# Patient Record
Sex: Female | Born: 1935 | Race: White | Hispanic: No | Marital: Married
Health system: Midwestern US, Community
[De-identification: ages and names within clinical notes are randomized; demographics above are authoritative.]

## PROBLEM LIST (undated history)

## (undated) DIAGNOSIS — I10 Essential (primary) hypertension: Secondary | ICD-10-CM

## (undated) DIAGNOSIS — H4020X0 Unspecified primary angle-closure glaucoma, stage unspecified: Secondary | ICD-10-CM

## (undated) DIAGNOSIS — R7989 Other specified abnormal findings of blood chemistry: Secondary | ICD-10-CM

## (undated) DIAGNOSIS — N390 Urinary tract infection, site not specified: Secondary | ICD-10-CM

## (undated) DIAGNOSIS — R319 Hematuria, unspecified: Secondary | ICD-10-CM

## (undated) DIAGNOSIS — M810 Age-related osteoporosis without current pathological fracture: Secondary | ICD-10-CM

## (undated) DIAGNOSIS — K635 Polyp of colon: Secondary | ICD-10-CM

## (undated) DIAGNOSIS — F0390 Unspecified dementia without behavioral disturbance: Secondary | ICD-10-CM

## (undated) DIAGNOSIS — E559 Vitamin D deficiency, unspecified: Secondary | ICD-10-CM

## (undated) DIAGNOSIS — D690 Allergic purpura: Secondary | ICD-10-CM

## (undated) DIAGNOSIS — H409 Unspecified glaucoma: Secondary | ICD-10-CM

## (undated) DIAGNOSIS — H348122 Central retinal vein occlusion, left eye, stable: Secondary | ICD-10-CM

## (undated) DIAGNOSIS — E079 Disorder of thyroid, unspecified: Secondary | ICD-10-CM

## (undated) DIAGNOSIS — L509 Urticaria, unspecified: Secondary | ICD-10-CM

## (undated) DIAGNOSIS — R413 Other amnesia: Secondary | ICD-10-CM

## (undated) DIAGNOSIS — F0280 Dementia in other diseases classified elsewhere without behavioral disturbance: Secondary | ICD-10-CM

## (undated) DIAGNOSIS — M1990 Unspecified osteoarthritis, unspecified site: Secondary | ICD-10-CM

## (undated) DIAGNOSIS — L309 Dermatitis, unspecified: Secondary | ICD-10-CM

## (undated) DIAGNOSIS — N189 Chronic kidney disease, unspecified: Secondary | ICD-10-CM

## (undated) DIAGNOSIS — C50919 Malignant neoplasm of unspecified site of unspecified female breast: Secondary | ICD-10-CM

## (undated) DIAGNOSIS — Z8709 Personal history of other diseases of the respiratory system: Secondary | ICD-10-CM

## (undated) DIAGNOSIS — F419 Anxiety disorder, unspecified: Secondary | ICD-10-CM

## (undated) DIAGNOSIS — R9389 Abnormal findings on diagnostic imaging of other specified body structures: Secondary | ICD-10-CM

## (undated) HISTORY — DX: Allergic purpura: D69.0

## (undated) HISTORY — DX: Essential (primary) hypertension: I10

## (undated) HISTORY — DX: Urinary tract infection, site not specified: N39.0

## (undated) HISTORY — DX: Unspecified glaucoma: H40.9

## (undated) HISTORY — DX: Age-related osteoporosis without current pathological fracture: M81.0

## (undated) HISTORY — DX: Other amnesia: R41.3

## (undated) HISTORY — DX: Malignant neoplasm of unspecified site of unspecified female breast: C50.919

## (undated) HISTORY — PX: ABDOMINAL HYSTERECTOMY: SHX81

## (undated) HISTORY — DX: Dementia in other diseases classified elsewhere, unspecified severity, without behavioral disturbance, psychotic disturbance, mood disturbance, and anxiety: F02.80

## (undated) HISTORY — DX: Unspecified primary angle-closure glaucoma, stage unspecified: H40.20X0

## (undated) HISTORY — DX: Vitamin D deficiency, unspecified: E55.9

## (undated) HISTORY — DX: Chronic kidney disease, unspecified: N18.9

## (undated) HISTORY — DX: Urticaria, unspecified: L50.9

## (undated) HISTORY — DX: Disorder of thyroid, unspecified: E07.9

## (undated) HISTORY — DX: Dermatitis, unspecified: L30.9

## (undated) HISTORY — PX: BIOPSY THYROID: PRO38

## (undated) HISTORY — DX: Other specified abnormal findings of blood chemistry: R79.89

## (undated) HISTORY — DX: Hematuria, unspecified: R31.9

## (undated) HISTORY — DX: Unspecified dementia, unspecified severity, without behavioral disturbance, psychotic disturbance, mood disturbance, and anxiety: F03.90

## (undated) HISTORY — DX: Central retinal vein occlusion, left eye, stable: H34.8122

## (undated) HISTORY — DX: Polyp of colon: K63.5

## (undated) HISTORY — DX: Unspecified osteoarthritis, unspecified site: M19.90

---
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
## 1987-02-04 HISTORY — PX: BREAST BIOPSY: SHX20

## 1987-03-06 HISTORY — PX: MASTECTOMY MODIFIED RADICAL: SUR848

## 2001-01-03 HISTORY — PX: BREAST BIOPSY: SHX20

## 2001-02-03 HISTORY — PX: MASTECTOMY: SHX3

## 2001-09-05 HISTORY — PX: RENAL BIOPSY: SHX156

## 2010-11-04 DIAGNOSIS — R7989 Other specified abnormal findings of blood chemistry: Secondary | ICD-10-CM

## 2010-11-04 HISTORY — DX: Other specified abnormal findings of blood chemistry: R79.89

## 2013-09-16 NOTE — Progress Notes (Addendum)
HISTORY OF PRESENT ILLNESS  Brett Canalesheresa L Bieda is a 80 y.o. female.  HPIshe is here for annual wellness exam  Chief Complaint   Patient presents with   ??? Annual Wellness Visit     Past Medical History   Diagnosis Date   ??? Thyroid disease      low TSH followed by Dr. Mickie Kayahmani   ??? Hypertension    ??? Cancer      Bilateral Breast Cancer   ??? Chronic kidney disease      followed by Dr. Dora Simssai   ??? Colon polyps    ??? Osteoporosis      followed by Dr. Odis Lusterlipsey   ??? Arthritis      osteo-arthritis   ??? Vitamin D deficiency        Review of Systems   Constitutional: Negative for fever, chills and malaise/fatigue.   HENT: Negative for congestion and hearing loss.    Eyes: Negative for blurred vision and pain.   Respiratory: Negative for cough, hemoptysis and shortness of breath.    Cardiovascular: Negative for chest pain, palpitations and leg swelling.   Gastrointestinal: Negative for heartburn, nausea, abdominal pain, diarrhea, constipation and blood in stool.   Genitourinary: Negative for dysuria, urgency, frequency and hematuria.   Musculoskeletal: Negative for myalgias and joint pain.   Neurological: Negative for dizziness, sensory change and headaches.   Endo/Heme/Allergies: Negative for polydipsia. Does not bruise/bleed easily.   Psychiatric/Behavioral: Negative for depression. The patient is not nervous/anxious and does not have insomnia.      Visit Vitals   Item Reading   ??? BP 116/78   ??? Pulse 60   ??? Ht 5' 3.5" (1.613 m)   ??? Wt 102 lb (46.267 kg)   ??? BMI 17.78 kg/m2       Physical Exam    ASSESSMENT and PLAN  sched bone density.  Immunizations up to date.  Will get labs today.  Medications refilled.

## 2013-09-23 LAB — CELIAC PANEL
DEAMIDATED GLIADIN ABS,IGA, 161651: 4 units (ref 0–19)
DEAMIDATED GLIADIN ABS,IGG, 161692: 2 units (ref 0–19)
Deamidated Gliadin Ab, IgA: 4 units (ref 0–19)
Deamidated Gliadin Ab, IgG: 2 units (ref 0–19)
Endomysial Ab, IgA: NEGATIVE
Endomysial Antibody, IgA: NEGATIVE
Immunoglobulin A, Qt.: 155 mg/dL (ref 91–414)
Immunoglobulin A, Qt.: 155 mg/dL (ref 91–414)
T-TRANSGLUTAMINASE (TTG) IGG,164989: <2 U/mL (ref 0–5)
T-TRANSGLUTAMINASE, IGA, 164643: <2 U/mL (ref 0–3)
t-Transglutaminase, IgA: <2 U/mL (ref 0–3)
t-Transglutaminase, IgG: <2 U/mL (ref 0–5)

## 2013-09-23 LAB — METABOLIC PANEL, COMPREHENSIVE
A-G Ratio: 2.2 (ref 1.1–2.5)
ALT (SGPT): 17 IU/L (ref 0–32)
AST (SGOT): 20 IU/L (ref 0–40)
Albumin: 4.3 g/dL (ref 3.5–4.8)
Alk. phosphatase: 61 IU/L (ref 39–117)
BUN/Creatinine ratio: 23 (ref 11–26)
BUN: 23 mg/dL (ref 8–27)
Bilirubin, total: 0.5 mg/dL (ref 0.0–1.2)
CO2: 25 mmol/L (ref 18–29)
Calcium: 9.5 mg/dL (ref 8.6–10.2)
Chloride: 100 mmol/L (ref 97–108)
Creatinine: 1 mg/dL (ref 0.57–1.00)
GFR est AA: 63 mL/min/{1.73_m2} (ref >59)
GFR est non-AA: 54 mL/min/{1.73_m2} — ABNORMAL LOW (ref >59)
GLOBULIN, TOTAL: 2 g/dL (ref 1.5–4.5)
Glucose: 83 mg/dL (ref 65–99)
Potassium: 4.4 mmol/L (ref 3.5–5.2)
Protein, total: 6.3 g/dL (ref 6.0–8.5)
Sodium: 140 mmol/L (ref 134–144)

## 2013-09-23 LAB — CBC WITH AUTOMATED DIFF
ABS. BASOPHILS: 0.1 10*3/uL (ref 0.0–0.2)
ABS. EOSINOPHILS: 0.2 10*3/uL (ref 0.0–0.4)
ABS. IMM. GRANS.: 0 10*3/uL (ref 0.0–0.1)
ABS. MONOCYTES: 0.8 10*3/uL (ref 0.1–0.9)
ABS. NEUTROPHILS: 2.5 10*3/uL (ref 1.4–7.0)
Abs Lymphocytes: 1.5 10*3/uL (ref 0.7–3.1)
BASOPHILS: 1 %
EOSINOPHILS: 3 %
HCT: 43.8 % (ref 34.0–46.6)
HGB: 14.5 g/dL (ref 11.1–15.9)
IMMATURE GRANULOCYTES: 0 %
Lymphocytes: 30 %
MCH: 32 pg (ref 26.6–33.0)
MCHC: 33.1 g/dL (ref 31.5–35.7)
MCV: 97 fL (ref 79–97)
MONOCYTES: 16 %
NEUTROPHILS: 50 %
PLATELET: 223 10*3/uL (ref 150–379)
RBC: 4.53 x10E6/uL (ref 3.77–5.28)
RDW: 13 % (ref 12.3–15.4)
WBC: 5 10*3/uL (ref 3.4–10.8)

## 2013-09-23 LAB — LIPID PANEL WITH LDL/HDL RATIO
Cholesterol, total: 207 mg/dL — ABNORMAL HIGH (ref 100–199)
HDL Cholesterol: 82 mg/dL (ref >39)
LDL, calculated: 102 mg/dL — ABNORMAL HIGH (ref 0–99)
LDL/HDL Ratio: 1.2 ratio units (ref 0.0–3.2)
Triglyceride: 114 mg/dL (ref 0–149)
VLDL, calculated: 23 mg/dL (ref 5–40)

## 2013-09-24 NOTE — Progress Notes (Signed)
Quick Note:    Celiac Disease panel is negative. Cholesterol with mild elevation; otherwise, labs are within normal. Mail to patient. Lb/gbt  ______

## 2014-03-12 DIAGNOSIS — Z9889 Other specified postprocedural states: Secondary | ICD-10-CM

## 2014-03-12 HISTORY — DX: Other specified postprocedural states: Z98.890

## 2014-09-22 ENCOUNTER — Encounter: Attending: Family Medicine | Primary: Family Medicine

## 2014-09-30 ENCOUNTER — Ambulatory Visit: Admit: 2014-09-30 | Discharge: 2014-09-30 | Payer: MEDICARE | Attending: Nurse Practitioner | Primary: Family Medicine

## 2014-09-30 DIAGNOSIS — Z0000 Encounter for general adult medical examination without abnormal findings: Secondary | ICD-10-CM

## 2014-09-30 LAB — AMB POC URINALYSIS DIP STICK AUTO W/O MICRO
Bilirubin (UA POC): NEGATIVE
Blood (UA POC): NEGATIVE
Glucose (UA POC): NEGATIVE
Ketones (UA POC): NEGATIVE
Nitrites (UA POC): NEGATIVE
Protein (UA POC): NEGATIVE mg/dL
Specific gravity (UA POC): 1.005 (ref 1.001–1.035)
Urobilinogen (UA POC): NORMAL (ref 0.2–1)
pH (UA POC): 5.5 (ref 4.6–8.0)

## 2014-09-30 NOTE — Progress Notes (Signed)
[facility], ESTABLISHED PATIENT, Annual Medicare Wellness    Melanie Houston  [id number]  [birth year]    Chief Complaint:   Chief Complaint   Patient presents with   ??? Annual Wellness Visit     History of Present Illness:  Melanie Houston is a 81 y.o. female who presents today for annual medicare wellness visit.  She is currently doing well, denies complaints or concerns.  Medicare wellness questionnaire completed today.  She is agreeable with routine labs today.  She is followed by nephrology and endocrinology--explained that these labs would be faxed to these specialist.    Review of Systems:  Review of Systems   Constitutional: Negative.    HENT: Negative.    Eyes: Negative.    Respiratory: Negative.    Cardiovascular: Negative.    Gastrointestinal: Negative.    Genitourinary: Negative.    Musculoskeletal: Negative.    Skin: Negative.    Neurological: Negative.    Endo/Heme/Allergies: Negative.    Psychiatric/Behavioral: Negative.        Medications:  Current Outpatient Prescriptions   Medication Sig   ??? ascorbic acid (VITAMIN C) 500 mg tablet Strength: ; Form: ; SIG: take 1 tablet orally Daily   ??? lactobacillus, B.ani-L.aci-L.sal-L.plan-L.Cas, 10 billion cell (2 billion ea) cap capsule Strength: ; Form: ; SIG: take as directed   ??? cranberry extract 450 mg tab Strength: ; Form: ; SIG: take 1 cap  daily   ??? doxycycline (VIBRAMYCIN) 100 mg capsule    ??? ergocalciferol (ERGOCALCIFEROL) 50,000 unit capsule Strength: 200 mg; Form: capsule; SIG: take 1 tab orally daily   ??? estradiol (ESTRACE) 0.01 % (0.1 mg/gram) vaginal cream    ??? ketoconazole (NIZORAL) 2 % shampoo    ??? nitrofurantoin, macrocrystal-monohydrate, (MACROBID) 100 mg capsule    ??? losartan (COZAAR) 100 mg tablet Strength: 100 mg; Form: tablet; SIG: take 1 tab(s) orally once a day   ??? metoprolol succinate (TOPROL-XL) 100 mg XL tablet Strength: 100 mg; Form: tablet, extended release; SIG: take 1 tab(s) orally once a day    ??? FLUZONE HIGH-DOSE 2015-16, PF, syrg injection      No current facility-administered medications for this visit.       Past Medical History:  Past Medical History   Diagnosis Date   ??? Thyroid disease      low TSH followed by Dr. Mickie Kay   ??? Hypertension    ??? Cancer (HCC)      Bilateral Breast Cancer   ??? Chronic kidney disease      followed by Dr. Dora Sims   ??? Colon polyps    ??? Osteoporosis      followed by Dr. Odis Luster   ??? Arthritis      osteo-arthritis   ??? Vitamin D deficiency        Surgical History:  Past Surgical History   Procedure Laterality Date   ??? Hx colonoscopy  2006   ??? Hx gyn       hysterectomy   ??? Hx heent       tonsillectomy   ??? Hx mastectomy Bilateral 1988 and 2002       Family History:  Family History   Problem Relation Age of Onset   ??? Cancer Mother    ??? Heart Disease Father        Social History:  History     Social History   ??? Marital Status: MARRIED     Spouse Name: N/A     Number of  Children: N/A   ??? Years of Education: N/A     Occupational History   ??? Not on file.     Social History Main Topics   ??? Smoking status: Never Smoker    ??? Smokeless tobacco: Not on file   ??? Alcohol Use: No   ??? Drug Use: Not on file   ??? Sexual Activity: Not on file     Other Topics Concern   ??? Not on file     Social History Narrative       History   Smoking status   ??? Never Smoker    Smokeless tobacco   ??? Not on file       Allergies:  Allergies   Allergen Reactions   ??? Azithromycin Unknown (comments)   ??? Celebrex [Celecoxib] Unknown (comments)   ??? Ibuprofen Unknown (comments)   ??? Penicillins Unknown (comments)   ??? Sulfa (Sulfonamide Antibiotics) Unknown (comments)   ??? Tetracyclines Unknown (comments)       Vital Signs:  BP 180/82 mmHg   Pulse 60   Ht 5\' 4"  (1.626 m)   Wt 105 lb (47.628 kg)   BMI 18.01 kg/m2  Body mass index is 18.01 kg/(m^2).    Lab/Imaging Results:  none    Physical Exam:  Physical Exam   Constitutional: She is oriented to person, place, and time and well-developed, well-nourished, and in no distress.    HENT:   Head: Normocephalic and atraumatic.   Eyes: EOM are normal.   Neck: Normal range of motion. Neck supple.   Pulmonary/Chest: Effort normal.   Musculoskeletal: Normal range of motion.   Neurological: She is alert and oriented to person, place, and time. Gait normal.   Skin: Skin is warm and dry.   Psychiatric: Mood, memory, affect and judgment normal.   Nursing note and vitals reviewed.        Assessment and Plan:  Patient Active Problem List   Diagnosis Code   ??? Thyroid disease E07.9   ??? Hypertension I10   ??? Chronic kidney disease N18.9   ??? Cancer (HCC) C80.1   ??? Colon polyps K63.5   ??? Osteoporosis M81.0   ??? Arthritis M19.90   ??? Vitamin D deficiency E55.9       ICD-10-CM ICD-9-CM    1. Medicare annual wellness visit, subsequent Z00.00 V70.0 AMB POC URINALYSIS DIP STICK AUTO W/O MICRO      METABOLIC PANEL, COMPREHENSIVE      LIPID PANEL      CBC WITH AUTOMATED DIFF   2. Cancer (HCC) C80.1 199.1 CBC WITH AUTOMATED DIFF   3. Thyroid disease E07.9 246.9 METABOLIC PANEL, COMPREHENSIVE      TSH, 3RD GENERATION   4. Chronic kidney disease, unspecified stage N18.9 585.9 METABOLIC PANEL, COMPREHENSIVE   5. Essential hypertension I10 401.9 METABOLIC PANEL, COMPREHENSIVE      LIPID PANEL     Follow-up Disposition:  Return in about 6 months (around 03/31/2015) for BP manangement.      Collier BullockJennifer D Ronisha Herringshaw, NP

## 2014-10-01 LAB — METABOLIC PANEL, COMPREHENSIVE
A-G Ratio: 1.9 (ref 1.1–2.5)
ALT (SGPT): 25 IU/L (ref 0–32)
AST (SGOT): 24 IU/L (ref 0–40)
Albumin: 4.4 g/dL (ref 3.5–4.8)
Alk. phosphatase: 76 IU/L (ref 39–117)
BUN/Creatinine ratio: 21 (ref 11–26)
BUN: 22 mg/dL (ref 8–27)
Bilirubin, total: 0.5 mg/dL (ref 0.0–1.2)
CO2: 25 mmol/L (ref 18–29)
Calcium: 10 mg/dL (ref 8.7–10.3)
Chloride: 101 mmol/L (ref 97–108)
Creatinine: 1.03 mg/dL — ABNORMAL HIGH (ref 0.57–1.00)
GFR est AA: 60 mL/min/{1.73_m2} (ref >59)
GFR est non-AA: 52 mL/min/{1.73_m2} — ABNORMAL LOW (ref >59)
GLOBULIN, TOTAL: 2.3 g/dL (ref 1.5–4.5)
Glucose: 92 mg/dL (ref 65–99)
Potassium: 5 mmol/L (ref 3.5–5.2)
Protein, total: 6.7 g/dL (ref 6.0–8.5)
Sodium: 141 mmol/L (ref 134–144)

## 2014-10-01 LAB — LIPID PANEL
Cholesterol, total: 235 mg/dL — ABNORMAL HIGH (ref 100–199)
HDL Cholesterol: 95 mg/dL (ref >39)
LDL, calculated: 121 mg/dL — ABNORMAL HIGH (ref 0–99)
Triglyceride: 94 mg/dL (ref 0–149)
VLDL, calculated: 19 mg/dL (ref 5–40)

## 2014-10-01 LAB — CBC WITH AUTOMATED DIFF
ABS. BASOPHILS: 0 10*3/uL (ref 0.0–0.2)
ABS. EOSINOPHILS: 0.1 10*3/uL (ref 0.0–0.4)
ABS. IMM. GRANS.: 0 10*3/uL (ref 0.0–0.1)
ABS. MONOCYTES: 0.8 10*3/uL (ref 0.1–0.9)
ABS. NEUTROPHILS: 5.9 10*3/uL (ref 1.4–7.0)
Abs Lymphocytes: 1.3 10*3/uL (ref 0.7–3.1)
BASOPHILS: 0 %
EOSINOPHILS: 1 %
HCT: 44.8 % (ref 34.0–46.6)
HGB: 15.2 g/dL (ref 11.1–15.9)
IMMATURE GRANULOCYTES: 0 %
Lymphocytes: 16 %
MCH: 32.6 pg (ref 26.6–33.0)
MCHC: 33.9 g/dL (ref 31.5–35.7)
MCV: 96 fL (ref 79–97)
MONOCYTES: 10 %
NEUTROPHILS: 73 %
PLATELET: 205 10*3/uL (ref 150–379)
RBC: 4.66 x10E6/uL (ref 3.77–5.28)
RDW: 13.1 % (ref 12.3–15.4)
WBC: 8.1 10*3/uL (ref 3.4–10.8)

## 2014-10-01 LAB — TSH 3RD GENERATION: TSH: 2.81 u[IU]/mL (ref 0.450–4.500)

## 2014-10-12 NOTE — Progress Notes (Signed)
Quick Note:        I have reviewed all the lab results. There are some abnormalities that are not critical to the patient's health, but I would like for her to repeat labs at next appt.    Healthy diet choices to avoid high fat, processed foods    ______

## 2014-10-13 NOTE — Progress Notes (Signed)
Quick Note:        Patient notified. She sees Dr. Dora Simssai for kidney disease.    ______

## 2015-03-31 ENCOUNTER — Encounter: Attending: Nurse Practitioner | Primary: Family Medicine

## 2015-04-06 ENCOUNTER — Ambulatory Visit: Admit: 2015-04-06 | Discharge: 2015-04-06 | Payer: MEDICARE | Attending: Family Medicine | Primary: Family Medicine

## 2015-04-06 DIAGNOSIS — I10 Essential (primary) hypertension: Secondary | ICD-10-CM

## 2015-04-06 MED ORDER — LOSARTAN 100 MG TAB
100 mg | ORAL_TABLET | Freq: Every day | ORAL | Status: DC
Start: 2015-04-06 — End: 2015-10-05

## 2015-04-06 MED ORDER — METOPROLOL SUCCINATE SR 100 MG 24 HR TAB
100 mg | ORAL_TABLET | Freq: Every day | ORAL | Status: DC
Start: 2015-04-06 — End: 2015-10-05

## 2015-04-06 NOTE — Progress Notes (Signed)
HISTORY OF PRESENT ILLNESS  Melanie Houston is a 81 y.o. female.  Hypertension   The history is provided by the patient. This is a chronic problem. The problem has not changed since onset.Pertinent negatives include no chest pain, no orthopnea, no palpitations, no PND, no anxiety, no confusion, no malaise/fatigue, no blurred vision, no headaches, no peripheral edema, no dizziness, no shortness of breath and no nausea.     Current Outpatient Prescriptions   Medication Sig   ??? cholecalciferol, vitamin D3, 2,000 unit tab Take  by mouth.   ??? metoprolol succinate (TOPROL-XL) 100 mg tablet Take 1 Tab by mouth daily.   ??? losartan (COZAAR) 100 mg tablet Take 1 Tab by mouth daily.   ??? ascorbic acid (VITAMIN C) 500 mg tablet Strength: ; Form: ; SIG: take 1 tablet orally Daily   ??? lactobacillus, B.ani-L.aci-L.sal-L.plan-L.Cas, 10 billion cell (2 billion ea) cap capsule Strength: ; Form: ; SIG: take as directed   ??? cranberry extract 450 mg tab Strength: ; Form: ; SIG: take 1 cap  daily   ??? estradiol (ESTRACE) 0.01 % (0.1 mg/gram) vaginal cream    ??? ketoconazole (NIZORAL) 2 % shampoo      No current facility-administered medications for this visit.     Past Medical History   Diagnosis Date   ??? Thyroid disease      low TSH followed by Dr. Mickie Kay   ??? Hypertension    ??? Cancer (HCC)      Bilateral Breast Cancer   ??? Chronic kidney disease      followed by Dr. Dora Sims   ??? Colon polyps    ??? Osteoporosis      followed by Dr. Odis Luster   ??? Arthritis      osteo-arthritis   ??? Vitamin D deficiency        Review of Systems   Constitutional: Negative for fever, chills and malaise/fatigue.   HENT: Negative for congestion and hearing loss.    Eyes: Negative for blurred vision, pain and discharge.   Respiratory: Negative for cough, hemoptysis, shortness of breath and wheezing.    Cardiovascular: Negative for chest pain, palpitations, orthopnea, leg swelling and PND.   Gastrointestinal: Negative for heartburn, nausea, abdominal pain,  diarrhea, constipation and blood in stool.   Genitourinary: Negative for dysuria, urgency, frequency and hematuria.   Musculoskeletal: Negative for myalgias and joint pain.   Skin: Negative for rash.   Neurological: Negative for dizziness, sensory change and headaches.   Endo/Heme/Allergies: Negative for polydipsia. Does not bruise/bleed easily.   Psychiatric/Behavioral: Negative for depression and confusion. The patient is not nervous/anxious and does not have insomnia.      Blood pressure 154/76, pulse 60, height 5\' 4"  (1.626 m), weight 105 lb (47.628 kg).    Physical Exam   Constitutional: She is oriented to person, place, and time. She appears well-developed and well-nourished.   Eyes: EOM are normal. Pupils are equal, round, and reactive to light.   Neck: No JVD present. No thyromegaly present.   Cardiovascular: Normal rate, regular rhythm, normal heart sounds and intact distal pulses.    No murmur heard.  Pulmonary/Chest: Effort normal and breath sounds normal. No respiratory distress.   Abdominal: Soft. Bowel sounds are normal. She exhibits no mass. There is no tenderness.   Musculoskeletal: She exhibits no edema.   Neurological: She is alert and oriented to person, place, and time.   Skin: Skin is warm and dry.   Psychiatric: She has a normal mood  and affect.   Nursing note and vitals reviewed.      ASSESSMENT and PLAN    ICD-10-CM ICD-9-CM    1. Essential hypertension, hypertension with unspecified goal I10 401.9      Suezette was seen today for follow-up.    Diagnoses and all orders for this visit:    Essential hypertension, hypertension with unspecified goal    Other orders  -     metoprolol succinate (TOPROL-XL) 100 mg tablet; Take 1 Tab by mouth daily.  -     losartan (COZAAR) 100 mg tablet; Take 1 Tab by mouth daily.      the following changes in treatment are made: check so night readings and call them in to make sure we are not over treating

## 2015-06-06 DIAGNOSIS — Z9889 Other specified postprocedural states: Secondary | ICD-10-CM

## 2015-06-06 HISTORY — DX: Other specified postprocedural states: Z98.890

## 2015-10-05 ENCOUNTER — Encounter: Attending: Nurse Practitioner | Primary: Family Medicine

## 2015-10-05 ENCOUNTER — Ambulatory Visit: Admit: 2015-10-05 | Discharge: 2015-10-05 | Payer: MEDICARE | Attending: Nurse Practitioner | Primary: Family Medicine

## 2015-10-05 DIAGNOSIS — E782 Mixed hyperlipidemia: Secondary | ICD-10-CM

## 2015-10-05 MED ORDER — LOSARTAN 100 MG TAB
100 mg | ORAL_TABLET | Freq: Every day | ORAL | 1 refills | Status: DC
Start: 2015-10-05 — End: 2015-10-05

## 2015-10-05 MED ORDER — METOPROLOL SUCCINATE SR 100 MG 24 HR TAB
100 mg | ORAL_TABLET | Freq: Every day | ORAL | 1 refills | Status: DC
Start: 2015-10-05 — End: 2016-12-14

## 2015-10-05 MED ORDER — METOPROLOL SUCCINATE SR 100 MG 24 HR TAB
100 mg | ORAL_TABLET | Freq: Every day | ORAL | 1 refills | Status: DC
Start: 2015-10-05 — End: 2015-10-05

## 2015-10-05 MED ORDER — LOSARTAN 100 MG TAB
100 mg | ORAL_TABLET | Freq: Every day | ORAL | 1 refills | Status: DC
Start: 2015-10-05 — End: 2016-10-20

## 2015-10-05 NOTE — Progress Notes (Signed)
This is a Subsequent Medicare Annual Wellness Visit providing Personalized Prevention Plan Services (PPPS) (Performed 12 months after initial AWV and PPPS )    I have reviewed the patient's medical history in detail and updated the computerized patient record.     History     Past Medical History   Diagnosis Date   ??? Arthritis      osteo-arthritis   ??? Cancer (HCC)      Bilateral Breast Cancer   ??? Chronic kidney disease      followed by Dr. Dora Sims   ??? Colon polyps    ??? Hypertension    ??? Osteoporosis      followed by Dr. Odis Luster   ??? Thyroid disease      low TSH followed by Dr. Mickie Kay   ??? Vitamin D deficiency       Past Surgical History   Procedure Laterality Date   ??? Hx colonoscopy  2006   ??? Hx gyn       hysterectomy   ??? Hx heent       tonsillectomy   ??? Hx mastectomy Bilateral 1988 and 2002     Current Outpatient Prescriptions   Medication Sig Dispense Refill   ??? nitrofurantoin, macrocrystal-monohydrate, (MACROBID) 100 mg capsule As needed      ??? latanoprost (XALATAN) 0.005 % ophthalmic solution INSTILL 1 DROP INTO BOTH EYES AT BEDTIME  11   ??? cholecalciferol, vitamin D3, 2,000 unit tab Take  by mouth.     ??? metoprolol succinate (TOPROL-XL) 100 mg tablet Take 1 Tab by mouth daily. 90 Tab 1   ??? losartan (COZAAR) 100 mg tablet Take 1 Tab by mouth daily. 90 Tab 1   ??? ascorbic acid (VITAMIN C) 500 mg tablet Strength: ; Form: ; SIG: take 1 tablet orally Daily     ??? lactobacillus, B.ani-L.aci-L.sal-L.plan-L.Cas, 10 billion cell (2 billion ea) cap capsule Strength: ; Form: ; SIG: take as directed     ??? cranberry extract 450 mg tab Strength: ; Form: ; SIG: take 1 cap  daily     ??? estradiol (ESTRACE) 0.01 % (0.1 mg/gram) vaginal cream      ??? ketoconazole (NIZORAL) 2 % shampoo        Allergies   Allergen Reactions   ??? Azithromycin Unknown (comments)   ??? Celebrex [Celecoxib] Unknown (comments)   ??? Ibuprofen Unknown (comments)   ??? Penicillins Unknown (comments)   ??? Sulfa (Sulfonamide Antibiotics) Unknown (comments)    ??? Tetracyclines Unknown (comments)     Family History   Problem Relation Age of Onset   ??? Cancer Mother    ??? Heart Disease Father      Social History   Substance Use Topics   ??? Smoking status: Never Smoker   ??? Smokeless tobacco: Never Used   ??? Alcohol use No     Patient Active Problem List   Diagnosis Code   ??? Thyroid disease E07.9   ??? Hypertension I10   ??? Chronic kidney disease N18.9   ??? Cancer (HCC) C80.1   ??? Osteoporosis M81.0   ??? Arthritis M19.90   ??? Vitamin D deficiency E55.9       Depression Risk Factor Screening:     PHQ 2 / 9, over the last two weeks 10/05/2015   Little interest or pleasure in doing things Not at all   Feeling down, depressed or hopeless Not at all   Total Score PHQ 2 0     Alcohol Risk Factor  Screening:   On any occasion during the past 3 months, have you had more than 3 drinks containing alcohol?  No    Do you average more than 7 drinks per week?  No      Functional Ability and Level of Safety:     Hearing Loss   Mild, left ear  Whisper Test:  Heard 3 out of 4 words    Activities of Daily Living   Self-care.    Requires assistance with: no ADLs  TUG score:  7 seconds, no gait abnormalities  Mini Cog:  passed  CDT:  Scored 4 points    Fall Risk     Fall Risk Assessment, last 12 mths 10/05/2015   Able to walk? Yes   Fall in past 12 months? No     Abuse Screen   Patient is not abused    Review of Systems   A comprehensive review of systems was negative except for that written in the HPI.    Physical Examination     Evaluation of Cognitive Function:  Mood/affect:  neutral  Appearance: age appropriate and casually dressed  Family member/caregiver input: none     Visit Vitals   ??? BP 162/84   ??? Pulse 80   ??? Ht  (1.626 m)   ??? Wt 107 lb (48.5 kg)   ??? SpO2 98%   ??? BMI 18.37 kg/m2     General appearance: alert, cooperative, no distress, appears stated age  Head: Normocephalic, without obvious abnormality, atraumatic  Neurologic: Grossly normal    Patient Care Team:   Jeanelle Malling, MD as PCP - General (Family Practice)    Advice/Referrals/Counseling   Education and counseling provided:  Are appropriate based on today's review and evaluation  End-of-Life planning (with patient's consent)    Assessment/Plan       ICD-10-CM ICD-9-CM    1. Mixed hyperlipidemia E78.2 272.2 LIPID PANEL   2. Medicare annual wellness visit, subsequent Z00.00 V70.0    3. Thyroid disease E07.9 246.9 TSH 3RD GENERATION   4. Essential hypertension I10 401.9 AMB POC URINALYSIS DIP STICK AUTO W/O MICRO      CBC WITH AUTOMATED DIFF      METABOLIC PANEL, COMPREHENSIVE      metoprolol succinate (TOPROL-XL) 100 mg tablet      losartan (COZAAR) 100 mg tablet   5. Chronic kidney disease, stage 1 N18.1 585.1    6. Vitamin D deficiency E55.9 268.9 VITAMIN D, 25 HYDROXY   7. Osteoporosis M81.0 733.00 DEXA BONE DENSITY STUDY AXIAL   8. Arthritis M19.90 716.90    9. Routine general medical examination at a health care facility Z00.00 V70.0    10. Screening for alcoholism Z13.89 V79.1    11. Screening for depression Z13.89 V79.0 DEPRESSION SCREEN ANNUAL   12. ACP (advance care planning) Z71.89 V65.49        Collier Bullock, NP

## 2015-10-05 NOTE — Patient Instructions (Addendum)
Medicare Part B Preventive Services Limitations Recommendation Scheduled   Bone Mass Measurement  (age 80 & older, biennial) Requires diagnosis related to osteoporosis or estrogen deficiency. Biennial benefit unless patient has history of long-term glucocorticoid tx or baseline is needed because initial test was by other method     Cardiovascular Screening Blood Tests (every 5 years)  Total cholesterol, HDL, Triglycerides Order as a panel if possible     Colorectal Cancer Screening  -Fecal occult blood test (annual)  -Flexible sigmoidoscopy (5y)  -Screening colonoscopy (10y)  -Barium Enema      Counseling to Prevent Tobacco Use (up to 8 sessions per year)  - Counseling greater than 3 and up to 10 minutes  - Counseling greater than 10 minutes Patients must be asymptomatic of tobacco-related conditions to receive as preventive service     Diabetes Screening Tests (at least every 3 years, Medicare covers annually or at 6-month intervals for prediabetic patients)    Fasting blood sugar (FBS) or glucose tolerance test (GTT) Patient must be diagnosed with one of the following:  -Hypertension, Dyslipidemia, obesity, previous impaired FBS or GTT  ???Or any two of the following: overweight, FH of diabetes, age ?65, history of gestational diabetes, birth of baby weighing more than 9 pounds     Diabetes Self-Management Training (DSMT) (no USPSTF recommendation) Requires referral by treating physician for patient with diabetes or renal disease. 10 hours of initial DSMT session of no less than 30 minutes each in a continuous 12-month period.  2 hours of follow-up DSMT in subsequent years.     Glaucoma Screening (no USPSTF recommendation) Diabetes mellitus, family history, African American, age 50 or over, Hispanic American, age 80 or over     Human Immunodeficiency Virus (HIV) Screening (annually for increased risk patients)  HIV-1 and HIV-2 by EIA, ELISA, rapid antibody test, or oral mucosa  transudate Patient must be at increased risk for HIV infection per USPSTF guidelines or pregnant.  Tests covered annually for patients at increased risk.  Pregnant patients may receive up to 3 test during pregnancy.     Medical Nutrition Therapy (MNT) (for diabetes or renal disease not recommended schedule) Requires referral by treating physician for patient with diabetes or renal disease.  Can be provided in same year as diabetes self-management training (DSMT), and CMS recommends medical nutrition therapy take place after DSMT.  Up to 3 hours for initial year and 2 hours in subsequent years.     Shingles Vaccination A shingles vaccine is also recommended once in a lifetime after age 60     Seasonal Influenza Vaccination (annually)      Pneumococcal Vaccination (once after 65)      Hepatitis B Vaccinations (if medium/high risk) Medium/high risk factors:  End-stage renal disease,  Hemophiliacs who received Factor VIII or IX concentrates, Clients of institutions for the mentally retarded, Persons who live in the same house as a HepB virus carrier, Homosexual men, Illicit injectable drug abusers.     Screening Mammography (biennial age 50-74) Annually (age 40 or over)     Screening Pap Tests and Pelvic Examination (up to age 70 and after 70 if unknown history or abnormal study last 10 years) Every 24 months except high risk     Ultrasound Screening for Abdominal Aortic Aneurysm (AAA) (once) Patient must be referred through IPPE and not have had a screening for abdominal aortic aneurysm before under Medicare.  Limited to patients who meet one of the following criteria:  - Men who   are 42-73 years old and have smoked more than 100 cigarettes in their lifetime.  -Anyone with a FH of AAA  -Anyone recommended for screening by USPSTF       Pneumococcal 13-Valent Vaccine, Diphtheria Conjugate (By injection)   Pneumococcal 13-Valent Vaccine, Diphtheria Conjugate (NOO-moe-KOK-al  13-VAY-lent VAX-een, dif-THEER-ee-a KON-joo-gate)  Prevents infections, such as pneumonia and meningitis.   Brand Name(s):Prevnar 13   There may be other brand names for this medicine.  When This Medicine Should Not Be Used:   This vaccine is not right for everyone. You should not receive this vaccine if you had an allergic reaction to pneumococcal or diphtheria vaccine.  How to Use This Medicine:   Injectable  ?? A nurse or other health provider will give you this medicine. This vaccine is usually given as a shot into a muscle in the thigh or upper arm.  ?? The vaccine schedule is different for different people.   ?? Tell your doctor if your child was born prematurely. Children who were premature may need to follow a different schedule.  ?? Children under 6: This vaccine is usually given as 3 or 4 separate shots over several months. Your child's doctor will tell you how many shots are needed and when to come back for the next one.  ?? Children over 6: This vaccine is given as a single shot. If your child recently received another pneumonia vaccine, this one should be given at least 8 weeks later.  ?? Adults over 50: This vaccine is given as a single dose.  ?? It is very important for your child to receive all of the shots for the vaccine.  ?? Missed dose: This vaccine must be given on a fixed schedule. If your child misses a dose, call your child's doctor for another appointment.  Drugs and Foods to Avoid:   Ask your doctor or pharmacist before using any other medicine, including over-the-counter medicines, vitamins, and herbal products.  ?? Some medicines can affect how this vaccine works. Tell your doctor if you are receiving a treatment or medicine that causes a weak immune system. This includes radiation treatment, steroid medicine (such as hydrocortisone, methylprednisolone, prednisolone, prednisone), or cancer medicine.  Warnings While Using This Medicine:    ?? Adults and adolescents: Tell your doctor if you are pregnant or breastfeeding.  ?? Tell your doctor if you have a weak immune system. You may not be fully protected by this vaccine.  Possible Side Effects While Using This Medicine:   Call your doctor right away if you notice any of these side effects:  ?? Allergic reaction: Itching or hives, swelling in your face or hands, swelling or tingling in your mouth or throat, chest tightness, trouble breathing  ?? High fever  If you notice these less serious side effects, talk with your doctor:   ?? Crying, irritability, or fussiness  ?? Joint or muscle pain  ?? Mild skin rash  ?? Pain, burning, redness, or swelling where the shot was given  ?? Poor appetite  ?? Sleep changes  If you notice other side effects that you think are caused by this medicine, tell your doctor.   Call your doctor for medical advice about side effects. You may report side effects to FDA at 1-800-FDA-1088  ?? 2016 [facility]. Information is for End User's use only and may not be sold, redistributed or otherwise used for commercial purposes.  The above information is an educational aid only. It is not  intended as medical advice for individual conditions or treatments. Talk to your doctor, nurse or pharmacist before following any medical regimen to see if it is safe and effective for you.    Pneumococcal Polyvalent Vaccine (By injection)   Pneumococcal Vaccine Polyvalent (NOO-moe-KOK-al VAX-een pol-ee-VAY-lent)  Prevents severe infections, such as pneumonia and meningitis.   Brand Name(s):Pneumovax 23   There may be other brand names for this medicine.  When This Medicine Should Not Be Used:   This vaccine is not right for everyone. You should not receive this vaccine if you had an allergic reaction to pneumococcal vaccine.  How to Use This Medicine:   Injectable  ?? A nurse or other health provider will give you this medicine. This  vaccine is given as a shot into a muscle or under the skin, usually in the thigh or upper arm.  Drugs and Foods to Avoid:   Ask your doctor or pharmacist before using any other medicine, including over-the-counter medicines, vitamins, and herbal products.  ?? Some medicines can affect how this vaccine works. Tell your doctor if you are receiving a treatment or medicine that causes a weak immune system. This includes radiation treatment, steroid medicine (such as hydrocortisone, methylprednisolone, prednisolone, prednisone), or cancer medicine.  ?? You should not receive varicella virus vaccine (Zostavax??) at the same time that you are given pneumococcal vaccine. The vaccines should be given 4 weeks apart.  Warnings While Using This Medicine:   ?? Tell the doctor if you are currently sick, or if you have heart disease, blood vessel disease, or lung disease.  ?? Adults and adolescents: Tell your doctor if you are pregnant or breastfeeding.  ?? Tell your doctor if you have a weak immune system. You may not be fully protected by this vaccine.  ?? Tell your doctor if you have any problems with spinal fluid, which could be caused by a birth defect or previous surgery. This vaccine may not prevent meningitis in people who have spinal fluid problems.  Possible Side Effects While Using This Medicine:   Call your doctor right away if you notice any of these side effects:  ?? Allergic reaction: Itching or hives, swelling in your face or hands, swelling or tingling in your mouth or throat, chest tightness, trouble breathing  ?? High fever  If you notice these less serious side effects, talk with your doctor:   ?? Headache  ?? Muscle or joint pain  ?? Pain, redness, swelling, or tenderness where the shot was given  ?? Tiredness or weakness  If you notice other side effects that you think are caused by this medicine, tell your doctor.   Call your doctor for medical advice about side effects. You may report  side effects to FDA at 1-800-FDA-1088  ?? 2016 [facility]. Information is for End User's use only and may not be sold, redistributed or otherwise used for commercial purposes.  The above information is an educational aid only. It is not intended as medical advice for individual conditions or treatments. Talk to your doctor, nurse or pharmacist before following any medical regimen to see if it is safe and effective for you.  Varicella Virus Vaccine (By injection)   Varicella Virus Vaccine (var-i-SEL-a VYE-rus VAX-een)  Varivax?? prevents chicken pox (varicella virus). Zostavax?? prevents shingles (herpes zoster virus).   Brand Name(s):Varivax, Zostavax   There may be other brand names for this medicine.  When This Medicine Should Not Be Used:   Varivax?? or Zostavax??  should not be given to anyone who has had an allergic reaction to varicella virus vaccine, gelatin, or neomycin, or to a child who has a fever. You should not receive either vaccine if you are pregnant or you are planning pregnancy. Also, these vaccines should not be given to a person who has an immune system problem (such as AIDS or HIV, a blood or bone marrow disorder, active and untreated tuberculosis (TB)) or who is taking medicine that weakens the immune system (such as high doses of steroids or medicine to treat cancer). Varivax?? and Zostavax?? may make you sick if your immune system is already weak, because they are made from a live varicella virus. Zostavax?? should not be given to children.  How to Use This Medicine:   Injectable  ?? Your doctor will prescribe your exact dose and tell you how often it should be given. This medicine is given as a shot under your skin.  ?? A nurse or other health provider will give you this medicine.  ?? Varivax??   ?? Most people who need the Varivax?? vaccine will need 2 shots. Children 12 months to 34 years of age should be give the second shot no sooner than 3  months after the first vaccine. Teenagers and adults should have a booster shot 4 to 8 weeks after the first vaccine. Your doctor can answer specific questions about your situation, especially if you need to follow a different schedule.  ?? Zostavax??   ?? You should receive only 1 dose of Zostavax??, unless your doctor tells you otherwise.  ?? Zostavax?? should not be used in place of Varivax?? to prevent chicken pox. Zostavax?? should also not be used to treat shingles. Zostavax?? is only preventive, although it may help ease pain if you get shingles even after receiving the vaccine.  ?? Read and follow the patient instructions that come with this medicine. Talk to your doctor or pharmacist if you have any questions.  If a dose is missed:   ?? It is important that Varivax?? be given at the proper time. If a scheduled shot is missed, call your doctor to make another appointment as soon as possible.  Drugs and Foods to Avoid:   Ask your doctor or pharmacist before using any other medicine, including over-the-counter medicines, vitamins, and herbal products.  ?? Varivax?? and Zostavax?? should not be given with Pneumovax?? pneumococcal vaccine. Tell your doctor about your vaccine history, or if you plan to get a flu shot or other vaccines.  ?? A child should not take any medicine that contains aspirin or another salicylate for at least 6 weeks after receiving Varivax??. Check the labels of any pain, headache, or cold medicine your child uses to be sure they do not contain aspirin or salicylic acid. In general, children should never be given aspirin because of the risk of Reye syndrome.  ?? You or your child should wait 2 months after receiving Varivax?? to receive varicella zoster immune globulin (VZIG) or other immune globulin medicines. You or your child should wait at least 5 months after receiving immune globulin, VZIG, or a blood or plasma transfusion before you get the Varivax?? vaccine.   ?? Tell your doctor if you or your child is using a medicine that weakens your immune system, such as a steroid or cancer medicine. Varivax?? and Zostavax?? may not work as well, or they could make you sick.  Warnings While Using This Medicine:   ?? It is not safe to  take this medicine during pregnancy. It could harm an unborn baby. Tell your doctor right away if you become pregnant. Avoid getting pregnant for 3 months after getting either the Varivax?? or Zostavax?? vaccine.  ?? Tell your doctor if you are breastfeeding before you are given Varivax?? or Zostavax??.  ?? The virus that is in this vaccine could be passed to others, even if you (or your child) do not feel sick. Avoid close contact with anyone who has a high risk for chicken pox or shingles infection. The waiting time for Varivax?? is at least 6 weeks. High-risk people include pregnant women, newborn babies, and people who cannot fight infection, such as patients who have bone marrow disease, cancer, or AIDS. Talk to your doctor if you might be with a high-risk person.  ?? Varivax?? may not always prevent chicken pox.  Zostavax?? may not always prevent shingles.  Possible Side Effects While Using This Medicine:   Call your doctor right away if you notice any of these side effects:  ?? Allergic reaction: Itching or hives, swelling in your face or hands, swelling or tingling in your mouth or throat, chest tightness, trouble breathing  ?? Blistering, peeling, or red skin rash  ?? Cough, chills, runny or stuffy nose, or cold-like symptoms  ?? High fever (at least 102 degrees F in children)  ?? Chicken pox  ?? Swollen glands where the shot was given  ?? Unusual bleeding or bruising  If you notice these less serious side effects, talk with your doctor:   ?? Headache, or ear, joint, or muscle pain  ?? Mild skin rash, itching, or dryness  ?? Pain, redness, itching, swelling, rash, or a hard lump where the shot was given   If you notice other side effects that you think are caused by this medicine, tell your doctor.   Call your doctor for medical advice about side effects. You may report side effects to FDA at 1-800-FDA-1088  ?? 2016 [facility]. Information is for End User's use only and may not be sold, redistributed or otherwise used for commercial purposes.  The above information is an educational aid only. It is not intended as medical advice for individual conditions or treatments. Talk to your doctor, nurse or pharmacist before following any medical regimen to see if it is safe and effective for you.

## 2015-10-05 NOTE — ACP (Advance Care Planning) (Signed)
Patient has her advanced care documents in order--both HCPOA and living will.  Her spouse is named as Management consultant.    Discussed bring these documents in to have them scanned into system  Encouraged annual review, assess for changes, etc.    Discussion time approx 16 minutes    Collier Bullock, NP

## 2015-11-09 ENCOUNTER — Encounter: Payer: MEDICARE | Primary: Family Medicine

## 2015-12-01 ENCOUNTER — Encounter: Payer: MEDICARE | Primary: Family Medicine

## 2016-02-16 ENCOUNTER — Encounter: Primary: Family Medicine

## 2016-03-30 ENCOUNTER — Encounter: Payer: MEDICARE | Attending: Nurse Practitioner | Primary: Family Medicine

## 2016-09-29 ENCOUNTER — Encounter

## 2016-09-29 NOTE — Telephone Encounter (Signed)
No follow up in 1 year. Needs office appt with labs.

## 2016-10-19 ENCOUNTER — Encounter

## 2016-10-20 ENCOUNTER — Encounter

## 2016-10-20 MED ORDER — LOSARTAN 100 MG TAB
100 mg | ORAL_TABLET | Freq: Every day | ORAL | 0 refills | Status: DC
Start: 2016-10-20 — End: 2016-11-17

## 2016-10-20 NOTE — Telephone Encounter (Signed)
30 DAYS GIVEN. Pt needs office visit prior to more refills.

## 2016-10-20 NOTE — Telephone Encounter (Signed)
LOSARTAN 100 MG  HAS 5 PILLS LEFT    [facility] [phone]

## 2016-11-17 ENCOUNTER — Encounter

## 2016-11-17 MED ORDER — LOSARTAN 100 MG TAB
100 mg | ORAL_TABLET | ORAL | 0 refills | Status: DC
Start: 2016-11-17 — End: 2016-12-14

## 2016-11-17 NOTE — Telephone Encounter (Signed)
Ok but its been over 1 yr since apt .

## 2016-12-14 ENCOUNTER — Ambulatory Visit: Admit: 2016-12-14 | Discharge: 2016-12-14 | Payer: MEDICARE | Attending: Family Medicine | Primary: Family Medicine

## 2016-12-14 DIAGNOSIS — I10 Essential (primary) hypertension: Secondary | ICD-10-CM

## 2016-12-14 MED ORDER — LOSARTAN 100 MG TAB
100 mg | ORAL_TABLET | Freq: Every day | ORAL | 1 refills | Status: DC
Start: 2016-12-14 — End: 2017-03-15

## 2016-12-14 MED ORDER — METOPROLOL SUCCINATE SR 100 MG 24 HR TAB
100 mg | ORAL_TABLET | Freq: Every day | ORAL | 1 refills | Status: DC
Start: 2016-12-14 — End: 2017-12-19

## 2016-12-14 NOTE — Progress Notes (Signed)
HISTORY OF PRESENT ILLNESS  Melanie Houston is a 83 y.o. female.  Hypertension    The history is provided by the patient. This is a recurrent problem. The problem has not changed since onset.Pertinent negatives include no chest pain, no orthopnea, no palpitations, no PND, no malaise/fatigue, no blurred vision, no headaches, no dizziness, no shortness of breath and no nausea.     Allergies   Allergen Reactions   ??? Azithromycin Unknown (comments)   ??? Celebrex [Celecoxib] Unknown (comments)   ??? Ibuprofen Unknown (comments)   ??? Penicillins Unknown (comments)   ??? Sulfa (Sulfonamide Antibiotics) Unknown (comments)   ??? Tetracyclines Unknown (comments)     Current Outpatient Prescriptions   Medication Sig   ??? dorzolamide-timolol (COSOPT) 22.3-6.8 mg/mL ophthalmic solution INSTILL 1 DROP INTO BOTH EYES TWICE A DAY   ??? losartan (COZAAR) 100 mg tablet Take 1 Tab by mouth daily.   ??? metoprolol succinate (TOPROL-XL) 100 mg tablet Take 1 Tab by mouth daily.   ??? latanoprost (XALATAN) 0.005 % ophthalmic solution INSTILL 1 DROP INTO BOTH EYES AT BEDTIME   ??? cholecalciferol, vitamin D3, 2,000 unit tab Take  by mouth.   ??? ascorbic acid (VITAMIN C) 500 mg tablet Strength: ; Form: ; SIG: take 1 tablet orally Daily   ??? lactobacillus, B.ani-L.aci-L.sal-L.plan-L.Cas, 10 billion cell (2 billion ea) cap capsule Strength: ; Form: ; SIG: take as directed   ??? cranberry extract 450 mg tab Strength: ; Form: ; SIG: take 1 cap  daily   ??? estradiol (ESTRACE) 0.01 % (0.1 mg/gram) vaginal cream    ??? ketoconazole (NIZORAL) 2 % shampoo      No current facility-administered medications for this visit.      Past Medical History:   Diagnosis Date   ??? Arthritis     osteo-arthritis   ??? Cancer (HCC)     Bilateral Breast Cancer   ??? Chronic kidney disease     followed by Dr. Dora Sims   ??? Chronic UTI     Followed by Dr. Elease Hashimoto, chronic macrobid   ??? Colon polyps    ??? Glaucoma    ??? Hematuria     followed by Dr. Thurston Hole   ??? Hypertension    ??? Osteoporosis      followed by Dr. Odis Luster   ??? Thyroid disease     low TSH followed by Dr. Mickie Kay   ??? Vitamin D deficiency        Review of Systems   Constitutional: Negative for chills, fever and malaise/fatigue.   HENT: Negative for congestion and hearing loss.    Eyes: Negative for blurred vision, pain and discharge.   Respiratory: Negative for cough, hemoptysis, shortness of breath and wheezing.    Cardiovascular: Negative for chest pain, palpitations, orthopnea, leg swelling and PND.   Gastrointestinal: Negative for abdominal pain, blood in stool, constipation, diarrhea, heartburn and nausea.   Genitourinary: Negative for dysuria, frequency, hematuria and urgency.   Musculoskeletal: Negative for joint pain and myalgias.   Skin: Negative for rash.   Neurological: Negative for dizziness, sensory change and headaches.   Endo/Heme/Allergies: Negative for polydipsia. Does not bruise/bleed easily.   Psychiatric/Behavioral: Negative for depression. The patient is not nervous/anxious and does not have insomnia.      Blood pressure 154/66, pulse 60, weight 97 lb (44 kg).      Physical Exam   Constitutional: She is oriented to person, place, and time. She appears well-developed and well-nourished.   Eyes: EOM are normal.  Pupils are equal, round, and reactive to light.   Neck: No JVD present. No thyromegaly present.   Cardiovascular: Normal rate, regular rhythm, normal heart sounds and intact distal pulses.    No murmur heard.  Pulmonary/Chest: Effort normal and breath sounds normal. No respiratory distress.   Abdominal: Soft. Bowel sounds are normal. She exhibits no mass. There is no tenderness.   Musculoskeletal: She exhibits no edema.   Neurological: She is alert and oriented to person, place, and time.   Skin: Skin is warm and dry.   Psychiatric: She has a normal mood and affect.       ASSESSMENT and PLAN    ICD-10-CM ICD-9-CM    1. Essential hypertension I10 401.9 losartan (COZAAR) 100 mg tablet       metoprolol succinate (TOPROL-XL) 100 mg tablet     Diagnoses and all orders for this visit:    1. Essential hypertension  -     losartan (COZAAR) 100 mg tablet; Take 1 Tab by mouth daily.  -     metoprolol succinate (TOPROL-XL) 100 mg tablet; Take 1 Tab by mouth daily.      current treatment plan is effective, no change in therapy

## 2016-12-20 ENCOUNTER — Ambulatory Visit: Admit: 2016-12-20 | Discharge: 2016-12-20 | Payer: MEDICARE | Attending: Family | Primary: Family Medicine

## 2016-12-20 DIAGNOSIS — L509 Urticaria, unspecified: Secondary | ICD-10-CM

## 2016-12-20 MED ORDER — METHYLPREDNISOLONE (PF) 40 MG/ML IJ SOLR
40 mg/mL | Freq: Once | INTRAMUSCULAR | Status: AC
Start: 2016-12-20 — End: 2016-12-20
  Administered 2016-12-20: 20:00:00 via INTRAMUSCULAR

## 2016-12-20 NOTE — Progress Notes (Signed)
Melanie Houston is a 83 y.o. female who presents with   Chief Complaint   Patient presents with   ??? Skin Problem      red, hivey rash over body x 2 days.  PT STARTED LOSARTAN 5 DAYS AGO.       History of Present Illness  Melanie Houston is a 83 y.o. female who presents with an itchy, red rash all over her body x 2 days.  She ran out of her losartan and restarted the medication 5 days ago, then the rash appeared.  She cannot recall using any new products, soaps, body washes, lotions or detergents.  She denies being outside in contact with plants, etc.  No fever, chills, chest pain, or shortness of breath endorsed.  She has tolerated losartan in the past without problem.  History of hypertension and chronic kidney disease.  Also on metoprolol.  Monitors BP at home and it is well maintained.  No other concerns today.        Review of Systems  Review of Systems   Constitutional: Negative for chills, fever and malaise/fatigue.   HENT: Negative for congestion, ear pain, hearing loss, sinus pain and sore throat.    Eyes: Negative for blurred vision and discharge.   Respiratory: Negative for cough, hemoptysis, shortness of breath and wheezing.    Cardiovascular: Negative for chest pain, palpitations, orthopnea, leg swelling and PND.   Gastrointestinal: Negative for abdominal pain, constipation, diarrhea, nausea and vomiting.   Genitourinary: Negative for dysuria, frequency, hematuria and urgency.   Musculoskeletal: Negative for joint pain and myalgias.   Skin: Positive for itching and rash.   Neurological: Negative for dizziness, sensory change and headaches.   Endo/Heme/Allergies: Positive for environmental allergies.   Psychiatric/Behavioral: Negative.  Negative for depression and suicidal ideas. The patient is not nervous/anxious.        Medications  Current Outpatient Prescriptions   Medication Sig   ??? dorzolamide-timolol (COSOPT) 22.3-6.8 mg/mL ophthalmic solution INSTILL 1 DROP INTO BOTH EYES TWICE A DAY    ??? metoprolol succinate (TOPROL-XL) 100 mg tablet Take 1 Tab by mouth daily.   ??? latanoprost (XALATAN) 0.005 % ophthalmic solution INSTILL 1 DROP INTO BOTH EYES AT BEDTIME   ??? cholecalciferol, vitamin D3, 2,000 unit tab Take  by mouth.   ??? ascorbic acid (VITAMIN C) 500 mg tablet Strength: ; Form: ; SIG: take 1 tablet orally Daily   ??? lactobacillus, B.ani-L.aci-L.sal-L.plan-L.Cas, 10 billion cell (2 billion ea) cap capsule Strength: ; Form: ; SIG: take as directed   ??? cranberry extract 450 mg tab Strength: ; Form: ; SIG: take 1 cap  daily   ??? estradiol (ESTRACE) 0.01 % (0.1 mg/gram) vaginal cream    ??? ketoconazole (NIZORAL) 2 % shampoo    ??? losartan (COZAAR) 100 mg tablet Take 1 Tab by mouth daily.     No current facility-administered medications for this visit.        Past Medical History  Past Medical History:   Diagnosis Date   ??? Arthritis     osteo-arthritis   ??? Cancer (HCC)     Bilateral Breast Cancer   ??? Chronic kidney disease     followed by Dr. Dora Sims   ??? Chronic UTI     Followed by Dr. Elease Hashimoto, chronic macrobid   ??? Colon polyps    ??? Glaucoma    ??? Hematuria     followed by Dr. Thurston Hole   ??? Hypertension    ??? Osteoporosis  followed by Dr. Odis Luster   ??? Thyroid disease     low TSH followed by Dr. Mickie Kay   ??? Vitamin D deficiency        Surgical History  Past Surgical History:   Procedure Laterality Date   ??? HX COLONOSCOPY  2006   ??? HX GYN      hysterectomy   ??? HX HEENT      tonsillectomy   ??? HX MASTECTOMY Bilateral 1988 and 2002          Family History  Family History   Problem Relation Age of Onset   ??? Cancer Mother    ??? Heart Disease Father        Social History  Social History     Social History   ??? Marital status: MARRIED     Spouse name: N/A   ??? Number of children: N/A   ??? Years of education: N/A     Occupational History   ??? Not on file.     Social History Main Topics   ??? Smoking status: Never Smoker   ??? Smokeless tobacco: Never Used   ??? Alcohol use No   ??? Drug use: No   ??? Sexual activity: Not on file      Other Topics Concern   ??? Not on file     Social History Narrative       History   Smoking Status   ??? Never Smoker   Smokeless Tobacco   ??? Never Used       Allergies  Allergies   Allergen Reactions   ??? Azithromycin Unknown (comments)   ??? Celebrex [Celecoxib] Unknown (comments)   ??? Ibuprofen Unknown (comments)   ??? Penicillins Unknown (comments)   ??? Sulfa (Sulfonamide Antibiotics) Unknown (comments)   ??? Tetracyclines Unknown (comments)       Vital Signs  Body mass index is 16.68 kg/(m^2).  Vitals:    12/20/16 1140   BP: 144/74   Pulse: 61   SpO2: 98%   Weight: 97 lb 3.2 oz (44.1 kg)       Physical Exam  Physical Exam   Constitutional: She is well-developed, well-nourished, and in no distress. No distress.   HENT:   Head: Normocephalic and atraumatic.   Right Ear: External ear normal.   Left Ear: External ear normal.   Nose: Nose normal.   Mouth/Throat: Uvula is midline, oropharynx is clear and moist and mucous membranes are normal. No oropharyngeal exudate or tonsillar abscesses.   Eyes: Conjunctivae and EOM are normal. Pupils are equal, round, and reactive to light.   Neck: Normal range of motion. No tracheal deviation present. No thyromegaly present.   Cardiovascular: Normal rate, regular rhythm, normal heart sounds and intact distal pulses.  Exam reveals no gallop and no friction rub.    No murmur heard.  Pulmonary/Chest: Effort normal and breath sounds normal. No respiratory distress. She has no decreased breath sounds. She has no wheezes. She has no rhonchi. She has no rales. She exhibits no tenderness.   Musculoskeletal: Normal range of motion.   Lymphadenopathy:     She has no cervical adenopathy.   Neurological: She is alert.   Skin: Skin is warm and dry. Rash noted. Rash is urticarial. She is not diaphoretic.   Whelps and erythema to bilateral upper and lower extremities.  Very pruritic.     Psychiatric: Mood and affect normal.   Nursing note and vitals reviewed.      Assessment and Plan  Diagnoses and all orders for this visit:    1. Hives  -     methylPREDNISolone (PF) (SOLU-MEDROL) injection 40 mg; 1 mL by IntraMUSCular route once.    Discussed medications, side effects and risks versus benefits in detail.  STOP losartan for now.    OTC Claritin encouraged.    Follow-up disposition: If symptoms worsen or fail to improve and 1 week for BP recheck with PCP.      Percival Spanish FNP-BC

## 2016-12-28 ENCOUNTER — Ambulatory Visit: Admit: 2016-12-28 | Discharge: 2016-12-28 | Payer: MEDICARE | Attending: Family Medicine | Primary: Family Medicine

## 2016-12-28 DIAGNOSIS — L509 Urticaria, unspecified: Secondary | ICD-10-CM

## 2016-12-28 NOTE — Progress Notes (Signed)
HISTORY OF PRESENT ILLNESS  Melanie Houston is a 83 y.o. female.  Rash    The history is provided by the patient (felt like it could have been related to her losarten that she ran out of then restarted when the rash appeared . rash resolved after medrol and has no recurred but patient has not restarted losartan ). This is a new problem. The problem has been resolved.     Allergies   Allergen Reactions   ??? Azithromycin Unknown (comments)   ??? Celebrex [Celecoxib] Unknown (comments)   ??? Ibuprofen Unknown (comments)   ??? Penicillins Unknown (comments)   ??? Sulfa (Sulfonamide Antibiotics) Unknown (comments)   ??? Tetracyclines Unknown (comments)     Current Outpatient Prescriptions   Medication Sig   ??? dorzolamide-timolol (COSOPT) 22.3-6.8 mg/mL ophthalmic solution INSTILL 1 DROP INTO BOTH EYES TWICE A DAY   ??? losartan (COZAAR) 100 mg tablet Take 1 Tab by mouth daily.   ??? metoprolol succinate (TOPROL-XL) 100 mg tablet Take 1 Tab by mouth daily.   ??? latanoprost (XALATAN) 0.005 % ophthalmic solution INSTILL 1 DROP INTO BOTH EYES AT BEDTIME   ??? cholecalciferol, vitamin D3, 2,000 unit tab Take  by mouth.   ??? ascorbic acid (VITAMIN C) 500 mg tablet Strength: ; Form: ; SIG: take 1 tablet orally Daily   ??? lactobacillus, B.ani-L.aci-L.sal-L.plan-L.Cas, 10 billion cell (2 billion ea) cap capsule Strength: ; Form: ; SIG: take as directed   ??? cranberry extract 450 mg tab Strength: ; Form: ; SIG: take 1 cap  daily   ??? estradiol (ESTRACE) 0.01 % (0.1 mg/gram) vaginal cream    ??? ketoconazole (NIZORAL) 2 % shampoo      No current facility-administered medications for this visit.      Past Medical History:   Diagnosis Date   ??? Arthritis     osteo-arthritis   ??? Cancer (HCC)     Bilateral Breast Cancer   ??? Chronic kidney disease     followed by Dr. Dora Sims   ??? Chronic UTI     Followed by Dr. Elease Hashimoto, chronic macrobid   ??? Colon polyps    ??? Glaucoma    ??? Hematuria     followed by Dr. Thurston Hole   ??? Hypertension    ??? Osteoporosis      followed by Dr. Odis Luster   ??? Thyroid disease     low TSH followed by Dr. Mickie Kay   ??? Vitamin D deficiency          Review of Systems   Constitutional: Negative for chills, fever and malaise/fatigue.   HENT: Negative for congestion and hearing loss.    Eyes: Negative for blurred vision, pain and discharge.   Respiratory: Negative for cough, hemoptysis, shortness of breath and wheezing.    Cardiovascular: Negative for chest pain, palpitations and leg swelling.   Gastrointestinal: Negative for abdominal pain, blood in stool, constipation, diarrhea, heartburn and nausea.   Genitourinary: Negative for dysuria, frequency, hematuria and urgency.   Musculoskeletal: Negative for joint pain and myalgias.   Skin: Positive for rash.   Neurological: Negative for dizziness, sensory change and headaches.   Endo/Heme/Allergies: Negative for polydipsia. Does not bruise/bleed easily.   Psychiatric/Behavioral: Negative for depression. The patient is not nervous/anxious and does not have insomnia.      Blood pressure 116/80.      Physical Exam   Constitutional: She is oriented to person, place, and time. She appears well-developed and well-nourished.   Eyes: EOM are  normal. Pupils are equal, round, and reactive to light.   Neck: No JVD present. No thyromegaly present.   Cardiovascular: Normal rate, regular rhythm, normal heart sounds and intact distal pulses.    No murmur heard.  Pulmonary/Chest: Effort normal and breath sounds normal. No respiratory distress.   Abdominal: Soft. Bowel sounds are normal. She exhibits no mass. There is no tenderness.   Musculoskeletal: She exhibits no edema.   Neurological: She is alert and oriented to person, place, and time.   Skin: Skin is warm and dry.   Psychiatric: She has a normal mood and affect.       ASSESSMENT and PLAN    ICD-10-CM ICD-9-CM    1. Urticaria L50.9 708.9    2. Essential hypertension I10 401.9      Diagnoses and all orders for this visit:    1. Urticaria     2. Essential hypertension      current treatment plan is effective, no change in therapy  the following changes in treatment are made:     Rash[; resolved , don't think it related to the losartan , we checked to make sure it was not a different manufacturer and it was not . BP is however excellent off the losartan currently so we will just leave it off for now and follow up .

## 2017-03-15 ENCOUNTER — Ambulatory Visit: Admit: 2017-03-15 | Discharge: 2017-03-15 | Payer: MEDICARE | Attending: Family Medicine | Primary: Family Medicine

## 2017-03-15 ENCOUNTER — Ambulatory Visit: Attending: Family Medicine | Primary: Family Medicine

## 2017-03-15 DIAGNOSIS — Z0000 Encounter for general adult medical examination without abnormal findings: Secondary | ICD-10-CM

## 2017-03-15 LAB — AMB POC URINALYSIS DIP STICK AUTO W/O MICRO
Bilirubin (UA POC): NEGATIVE
Blood (UA POC): NEGATIVE
Glucose (UA POC): NEGATIVE
Ketones (UA POC): NEGATIVE
Leukocyte esterase (UA POC): NEGATIVE
Nitrites (UA POC): NEGATIVE
Protein (UA POC): NEGATIVE
Specific gravity (UA POC): 1.025 (ref 1.001–1.035)
Urobilinogen (UA POC): NORMAL (ref 0.2–1)
pH (UA POC): 5.5 (ref 4.6–8.0)

## 2017-03-15 MED ORDER — NITROFURANTOIN (25% MACROCRYSTAL FORM) 100 MG CAP
100 mg | ORAL_CAPSULE | Freq: Two times a day (BID) | ORAL | 0 refills | Status: DC
Start: 2017-03-15 — End: 2018-03-29

## 2017-03-16 LAB — METABOLIC PANEL, COMPREHENSIVE
A-G Ratio: 2.1 (ref 1.2–2.2)
ALT (SGPT): 15 IU/L (ref 0–32)
AST (SGOT): 16 IU/L (ref 0–40)
Albumin: 4.5 g/dL (ref 3.5–4.7)
Alk. phosphatase: 53 IU/L (ref 39–117)
BUN/Creatinine ratio: 19 (ref 12–28)
BUN: 22 mg/dL (ref 8–27)
Bilirubin, total: 0.6 mg/dL (ref 0.0–1.2)
CO2: 25 mmol/L (ref 20–29)
Calcium: 9.9 mg/dL (ref 8.7–10.3)
Chloride: 104 mmol/L (ref 96–106)
Creatinine: 1.15 mg/dL — ABNORMAL HIGH (ref 0.57–1.00)
GFR est AA: 52 mL/min/{1.73_m2} — ABNORMAL LOW (ref >59)
GFR est non-AA: 45 mL/min/{1.73_m2} — ABNORMAL LOW (ref >59)
GLOBULIN, TOTAL: 2.1 g/dL (ref 1.5–4.5)
Glucose: 83 mg/dL (ref 65–99)
Potassium: 4.9 mmol/L (ref 3.5–5.2)
Protein, total: 6.6 g/dL (ref 6.0–8.5)
Sodium: 142 mmol/L (ref 134–144)

## 2017-03-16 LAB — TSH 3RD GENERATION: TSH: 1.95 u[IU]/mL (ref 0.450–4.500)

## 2017-03-16 LAB — CBC WITH AUTOMATED DIFF
ABS. BASOPHILS: 0 10*3/uL (ref 0.0–0.2)
ABS. EOSINOPHILS: 0.1 10*3/uL (ref 0.0–0.4)
ABS. IMM. GRANS.: 0 10*3/uL (ref 0.0–0.1)
ABS. MONOCYTES: 0.7 10*3/uL (ref 0.1–0.9)
ABS. NEUTROPHILS: 4.8 10*3/uL (ref 1.4–7.0)
Abs Lymphocytes: 1.3 10*3/uL (ref 0.7–3.1)
BASOPHILS: 0 %
EOSINOPHILS: 1 %
HCT: 43.7 % (ref 34.0–46.6)
HGB: 15.1 g/dL (ref 11.1–15.9)
IMMATURE GRANULOCYTES: 0 %
Lymphocytes: 19 %
MCH: 32.5 pg (ref 26.6–33.0)
MCHC: 34.6 g/dL (ref 31.5–35.7)
MCV: 94 fL (ref 79–97)
MONOCYTES: 10 %
NEUTROPHILS: 70 %
PLATELET: 208 10*3/uL (ref 150–379)
RBC: 4.65 x10E6/uL (ref 3.77–5.28)
RDW: 13.2 % (ref 12.3–15.4)
WBC: 6.9 10*3/uL (ref 3.4–10.8)

## 2017-03-16 LAB — LIPID PANEL WITH LDL/HDL RATIO
Cholesterol, total: 222 mg/dL — ABNORMAL HIGH (ref 100–199)
HDL Cholesterol: 78 mg/dL (ref >39)
LDL, calculated: 125 mg/dL — ABNORMAL HIGH (ref 0–99)
LDL/HDL Ratio: 1.6 ratio (ref 0.0–3.2)
Triglyceride: 93 mg/dL (ref 0–149)
VLDL, calculated: 19 mg/dL (ref 5–40)

## 2017-03-16 LAB — VITAMIN D, 25 HYDROXY: VITAMIN D, 25-HYDROXY: 31.6 ng/mL (ref 30.0–100.0)

## 2017-03-22 NOTE — Patient Instructions (Signed)
Medicare Wellness Visit, Female    The best way to live healthy is to have a lifestyle where you eat a well-balanced diet, exercise regularly, limit alcohol use, and quit all forms of tobacco/nicotine, if applicable.     Regular preventive services are another way to keep healthy. Preventive services (vaccines, screening tests, monitoring & exams) can help personalize your care plan, which helps you manage your own care. Screening tests can find health problems at the earliest stages, when they are easiest to treat.     [facility] follows the current, evidence-based guidelines published by the [place] [employer] (USPSTF) when recommending preventive services for our patients. Because we follow these guidelines, sometimes recommendations change over time as research supports it. (For example, mammograms used to be recommended annually. Even though Medicare will still pay for an annual mammogram, the newer guidelines recommend a mammogram every two years for women of average risk.)    Of course, you and your provider may decide to screen more often for some diseases, based on your risk and co-morbidities (chronic disease you are already diagnosed with).     Preventive services for you include:    - Medicare offers their members a free annual wellness visit, which is time for you and your primary care provider to discuss and plan for your preventive service needs. Take advantage of this benefit every year!    -All people over age 8 should receive the recommended pneumonia vaccines. Current USPSTF guidelines recommend a series of two vaccines for the best pneumonia protection.     -All adults should have a yearly flu vaccine and a tetanus vaccine every 10 years. All adults age 49 years should receive a shingles vaccine once in their lifetime.      -A bone mass density test is recommended when a woman turns 65 to screen  for osteoporosis. This test is only recommended once as a screening. Some providers will use this same test as a disease monitoring tool if you already have osteoporosis.    -All adults age 103-70 years who are overweight should have a diabetes screening test once every three years.     -Other screening tests & preventive services for persons with diabetes include: an eye exam to screen for diabetic retinopathy, a kidney function test, a foot exam, and stricter control over your cholesterol.     -Cardiovascular screening for adults with routine risk involves an electrocardiogram (ECG) at intervals determined by the provider.     -Colorectal cancer screenings should be done for adults age 36-75 years with normal risk. There are a number of acceptable methods of screening for this type of cancer. Each test has its own benefits and drawbacks. Discuss with your provider what is most appropriate for you during your annual wellness visit. The different tests include: colonoscopy (considered the best screening method), a fecal occult blood test, a fecal DNA test, and sigmoidoscopy.    -Breast cancer screenings are recommended every other year for women of normal risk age 41-74 years.     -Cervical cancer screenings for women over age 46 are only recommended with certain risk factors.     -All adults born between [place] should be screened once for Hepatitis C.     Here is a list of your current Health Maintenance items (your personalized list of preventive services) with a due date:  Health Maintenance Due   Topic Date Due   ??? DTaP/Tdap/Td  (1 -  Tdap) 05/13/1955   ??? Pneumococcal Vaccine (2 of 2 - PPSV23) 02/15/2016   ??? Annual Well Visit  11/22/2016

## 2017-03-22 NOTE — Progress Notes (Signed)
This is the Subsequent Medicare Annual Wellness Exam, performed 12 months or more after the Initial AWV or the last Subsequent AWV    I have reviewed the patient's medical history in detail and updated the computerized patient record.     History     Past Medical History:   Diagnosis Date   ??? Arthritis     osteo-arthritis   ??? Cancer (HCC)     Bilateral Breast Cancer   ??? Chronic kidney disease     followed by Dr. Dora Sims   ??? Chronic UTI     Followed by Dr. Elease Hashimoto, chronic macrobid   ??? Colon polyps    ??? Glaucoma    ??? Hematuria     followed by Dr. Thurston Hole   ??? Hypertension    ??? Osteoporosis     followed by Dr. Odis Luster   ??? Thyroid disease     low TSH followed by Dr. Mickie Kay   ??? Vitamin D deficiency       Past Surgical History:   Procedure Laterality Date   ??? HX COLONOSCOPY  2006   ??? HX GYN      hysterectomy   ??? HX HEENT      tonsillectomy   ??? HX MASTECTOMY Bilateral 1988 and 2002     Current Outpatient Prescriptions   Medication Sig Dispense Refill   ??? OTHER,NON-FORMULARY, Unknown eye drop for glaucoma     ??? nitrofurantoin, macrocrystal-monohydrate, (MACROBID) 100 mg capsule Take 1 Cap by mouth two (2) times a day. 14 Cap 0   ??? dorzolamide-timolol (COSOPT) 22.3-6.8 mg/mL ophthalmic solution INSTILL 1 DROP INTO BOTH EYES TWICE A DAY  4   ??? metoprolol succinate (TOPROL-XL) 100 mg tablet Take 1 Tab by mouth daily. 90 Tab 1   ??? latanoprost (XALATAN) 0.005 % ophthalmic solution INSTILL 1 DROP INTO BOTH EYES AT BEDTIME  11   ??? cholecalciferol, vitamin D3, 2,000 unit tab Take  by mouth.     ??? ascorbic acid (VITAMIN C) 500 mg tablet Strength: ; Form: ; SIG: take 1 tablet orally Daily     ??? cranberry extract 450 mg tab Strength: ; Form: ; SIG: take 1 cap  daily       Allergies   Allergen Reactions   ??? Azithromycin Unknown (comments)   ??? Celebrex [Celecoxib] Unknown (comments)   ??? Ibuprofen Unknown (comments)   ??? Penicillins Unknown (comments)   ??? Sulfa (Sulfonamide Antibiotics) Unknown (comments)    ??? Tetracyclines Unknown (comments)     Family History   Problem Relation Age of Onset   ??? Cancer Mother    ??? Heart Disease Father      Social History   Substance Use Topics   ??? Smoking status: Never Smoker   ??? Smokeless tobacco: Never Used   ??? Alcohol use No     Patient Active Problem List   Diagnosis Code   ??? Thyroid disease E07.9   ??? Hypertension I10   ??? Chronic kidney disease N18.9   ??? Cancer (HCC) C80.1   ??? Osteoporosis M81.0   ??? Arthritis M19.90   ??? Vitamin D deficiency E55.9   ??? Glaucoma H40.9       Depression Risk Factor Screening:     PHQ over the last two weeks 12/28/2016   PHQ Not Done -   Little interest or pleasure in doing things Not at all   Feeling down, depressed, irritable, or hopeless Not at all   Total Score PHQ 2 0  Alcohol Risk Factor Screening:   You do not drink alcohol or very rarely.    Functional Ability and Level of Safety:   Hearing Loss  Hearing is good.    Activities of Daily Living  The home contains: no safety equipment.  Patient does total self care    Fall Risk  Fall Risk Assessment, last 12 mths 12/28/2016   Able to walk? Yes   Fall in past 12 months? No       Abuse Screen  Patient is not abused    Cognitive Screening   Evaluation of Cognitive Function:  Has your family/caregiver stated any concerns about your memory: no  Normal    Patient Care Team   Patient Care Team:  Jeanelle MallingMark T Mat Stuard, MD as PCP - General (Family Practice)    Assessment/Plan   Education and counseling provided:  Are appropriate based on today's review and evaluation    Diagnoses and all orders for this visit:    1. Health care maintenance  -     Urinalysis dip stick auto w/o micro  -     CBC  -     Lipid Panel with LDL/HDL  -     CMP  -     TSH  -     Vitamin D    2. Frequent UTI  -     Urinalysis dip stick auto w/o micro  -     CBC  -     Lipid Panel with LDL/HDL  -     CMP  -     TSH  -     Vitamin D    3. Vitamin D deficiency  -     Urinalysis dip stick auto w/o micro  -     CBC  -     Lipid Panel with LDL/HDL   -     CMP  -     TSH  -     Vitamin D    4. Osteoporosis without current pathological fracture, unspecified osteoporosis type  -     Urinalysis dip stick auto w/o micro  -     CBC  -     Lipid Panel with LDL/HDL  -     CMP  -     TSH  -     Vitamin D    5. Essential hypertension  -     Urinalysis dip stick auto w/o micro  -     CBC  -     Lipid Panel with LDL/HDL  -     CMP  -     TSH  -     Vitamin D    6. History of colon polyps  -     GASTROENTEROLOGY    7. Medicare annual wellness visit, subsequent    8. Screening for alcoholism  -     Annual  Alcohol Screen 15 min ([id number])    9. Screening for depression  -     Depression Screen Annual    Other orders  -     nitrofurantoin, macrocrystal-monohydrate, (MACROBID) 100 mg capsule; Take 1 Cap by mouth two (2) times a day.        Health Maintenance Due   Topic Date Due   ??? DTaP/Tdap/Td series (1 - Tdap) 05/13/1957   ??? Pneumococcal 65+ High/Highest Risk (2 of 2 - PPSV23) 02/15/2016   ??? MEDICARE YEARLY EXAM  11/22/2016

## 2017-04-11 ENCOUNTER — Encounter: Attending: Nurse Practitioner | Primary: Family Medicine

## 2017-05-17 ENCOUNTER — Inpatient Hospital Stay: Admit: 2017-05-17 | Primary: Family Medicine

## 2017-05-17 HISTORY — PX: COLONOSCOPY: SHX174

## 2017-12-19 ENCOUNTER — Encounter

## 2017-12-20 MED ORDER — METOPROLOL SUCCINATE SR 100 MG 24 HR TAB
100 mg | ORAL_TABLET | ORAL | 0 refills | Status: DC
Start: 2017-12-20 — End: 2018-04-13

## 2018-03-29 ENCOUNTER — Ambulatory Visit: Admit: 2018-03-29 | Discharge: 2018-03-29 | Payer: MEDICARE | Attending: Family Medicine | Primary: Family Medicine

## 2018-03-29 ENCOUNTER — Ambulatory Visit: Attending: Family Medicine | Primary: Family Medicine

## 2018-03-29 DIAGNOSIS — I10 Essential (primary) hypertension: Secondary | ICD-10-CM

## 2018-03-29 LAB — AMB POC URINALYSIS DIP STICK AUTO W/O MICRO
Bilirubin (UA POC): NEGATIVE
Bilirubin, Urine, POC: NEGATIVE
Blood (UA POC): NEGATIVE
Blood (UA POC): NEGATIVE
Glucose (UA POC): NEGATIVE
Glucose, Urine, POC: NEGATIVE
Ketones (UA POC): NEGATIVE
Ketones, Urine, POC: NEGATIVE
Leukocyte Esterase, Urine, POC: NEGATIVE
Leukocyte esterase (UA POC): NEGATIVE
Nitrite, Urine, POC: NEGATIVE
Nitrites (UA POC): NEGATIVE
Protein (UA POC): NEGATIVE
Protein, Urine, POC: NEGATIVE
Specific Gravity, Urine, POC: 1.01 NA (ref 1.001–1.035)
Specific gravity (UA POC): 1.01 (ref 1.001–1.035)
Urobilinogen (UA POC): NORMAL (ref 0.2–1)
Urobilinogen, POC: NORMAL (ref 0.2–1)
pH (UA POC): 6 (ref 4.6–8.0)
pH, Urine, POC: 6 NA (ref 4.6–8.0)

## 2018-03-29 LAB — COMPREHENSIVE METABOLIC PANEL
Albumin: 4.6 (ref 3.5–5.0)
Calcium: 9.8 (ref 8.7–10.7)
GFR calc Af Amer: 55
GFR calc non Af Amer: 48

## 2018-03-29 LAB — HEPATIC FUNCTION PANEL
ALT: 27 (ref 7–35)
AST: 30 (ref 13–35)
Alkaline Phosphatase: 60 (ref 25–125)
Bilirubin, Total: 0.7

## 2018-03-29 LAB — LIPID PANEL
Cholesterol: 222 — AB (ref 0–200)
HDL: 90 — AB (ref 35–70)
LDL Cholesterol: 120
LDl/HDL Ratio: 1.3
Triglycerides: 62 (ref 40–160)

## 2018-03-29 LAB — BASIC METABOLIC PANEL
BUN: 20 (ref 4–21)
CO2: 24 — AB (ref 13–22)
Chloride: 103 (ref 99–108)
Creatinine: 1.1 (ref 0.5–1.1)
Glucose: 91
Potassium: 4 (ref 3.4–5.3)
Sodium: 141 (ref 137–147)

## 2018-03-29 LAB — CBC AND DIFFERENTIAL
HCT: 43 (ref 36–46)
Hemoglobin: 14.7 (ref 12.0–16.0)
Neutrophils Absolute: 67
Platelets: 188 (ref 150–399)
WBC: 8.1

## 2018-03-29 LAB — CBC: RBC: 4.55 (ref 3.87–5.11)

## 2018-03-29 LAB — TSH: TSH: 3 (ref 0.41–5.90)

## 2018-03-29 NOTE — Addendum Note (Signed)
Addended by: Almedia BallsWHITE, Cayce Paschal T on: 06/04/2018 09:06 PM     Modules accepted: Orders, Level of Service, SmartSet

## 2018-03-29 NOTE — Patient Instructions (Addendum)
Medicare Wellness Visit, Female     The best way to live healthy is to have a lifestyle where you eat a well-balanced diet, exercise regularly, limit alcohol use, and quit all forms of tobacco/nicotine, if applicable.   Regular preventive services are another way to keep healthy. Preventive services (vaccines, screening tests, monitoring & exams) can help personalize your care plan, which helps you manage your own care. Screening tests can find health problems at the earliest stages, when they are easiest to treat.   [facility] follows the current, evidence-based guidelines published by the [place] [employer]ive Services Task [employer] (USPSTF) when recommending preventive services for our patients. Because we follow these guidelines, sometimes recommendations change over time as research supports it. (For example, mammograms used to be recommended annually. Even though Medicare will still pay for an annual mammogram, the newer guidelines recommend a mammogram every two years for women of average risk.)  Of course, you and your doctor may decide to screen more often for some diseases, based on your risk and your health status.   Preventive services for you include:  - Medicare offers their members a free annual wellness visit, which is time for you and your primary care provider to discuss and plan for your preventive service needs. Take advantage of this benefit every year!  -All adults over the age of 82 should receive the recommended pneumonia vaccines. Current USPSTF guidelines recommend a series of two vaccines for the best pneumonia protection.   -All adults should have a flu vaccine yearly and a tetanus vaccine every 10 years. All adults age 82 and older should receive a shingles vaccine once in their lifetime.    -A bone mass density test is recommended when a woman turns 65 to screen for osteoporosis. This test is only recommended one time, as a screening. Some providers will use this same test as a  disease monitoring tool if you already have osteoporosis.  -All adults age 82-70 who are overweight should have a diabetes screening test once every three years.   -Other screening tests and preventive services for persons with diabetes include: an eye exam to screen for diabetic retinopathy, a kidney function test, a foot exam, and stricter control over your cholesterol.   -Cardiovascular screening for adults with routine risk involves an electrocardiogram (ECG) at intervals determined by your doctor.   -Colorectal cancer screenings should be done for adults age 82-75 with no increased risk factors for colorectal cancer.  There are a number of acceptable methods of screening for this type of cancer. Each test has its own benefits and drawbacks. Discuss with your doctor what is most appropriate for you during your annual wellness visit. The different tests include: colonoscopy (considered the best screening method), a fecal occult blood test, a fecal DNA test, and sigmoidoscopy.  -Breast cancer screenings are recommended every other year for women of normal risk, age 82-74.  -Cervical cancer screenings for women over age 82 are only recommended with certain risk factors.   -All adults born between [birth year] and 1965 should be screened once for Hepatitis C.     Here is a list of your current Health Maintenance items (your personalized list of preventive services) with a due date:  Health Maintenance Due   Topic Date Due   ??? DTaP/Tdap/Td  (1 - Tdap) 05/13/1957   ??? Shingles Vaccine (1 of 2) 05/13/1986   ??? Pneumococcal Vaccine (2 of 2 - PCV13) 02/15/2012   ??? Glaucoma Screening  09/22/2017   ??? Annual Well Visit  03/16/2018         Medicare Wellness Visit, Female     The best way to live healthy is to have a lifestyle where you eat a well-balanced diet, exercise regularly, limit alcohol use, and quit all forms of tobacco/nicotine, if applicable.   Regular preventive services are another way to keep healthy. Preventive  services (vaccines, screening tests, monitoring & exams) can help personalize your care plan, which helps you manage your own care. Screening tests can find health problems at the earliest stages, when they are easiest to treat.   [facility] follows the current, evidence-based guidelines published by the [place] [employer] (USPSTF) when recommending preventive services for our patients. Because we follow these guidelines, sometimes recommendations change over time as research supports it. (For example, mammograms used to be recommended annually. Even though Medicare will still pay for an annual mammogram, the newer guidelines recommend a mammogram every two years for women of average risk.)  Of course, you and your doctor may decide to screen more often for some diseases, based on your risk and your health status.   Preventive services for you include:  - Medicare offers their members a free annual wellness visit, which is time for you and your primary care provider to discuss and plan for your preventive service needs. Take advantage of this benefit every year!  -All adults over the age of 76 should receive the recommended pneumonia vaccines. Current USPSTF guidelines recommend a series of two vaccines for the best pneumonia protection.   -All adults should have a flu vaccine yearly and a tetanus vaccine every 10 years. All adults age 66 and older should receive a shingles vaccine once in their lifetime.    -A bone mass density test is recommended when a woman turns 65 to screen for osteoporosis. This test is only recommended one time, as a screening. Some providers will use this same test as a disease monitoring tool if you already have osteoporosis.  -All adults age 88-70 who are overweight should have a diabetes screening test once every three years.   -Other screening tests and preventive services for persons with diabetes include: an eye exam to screen for diabetic retinopathy, a  kidney function test, a foot exam, and stricter control over your cholesterol.   -Cardiovascular screening for adults with routine risk involves an electrocardiogram (ECG) at intervals determined by your doctor.   -Colorectal cancer screenings should be done for adults age 52-75 with no increased risk factors for colorectal cancer.  There are a number of acceptable methods of screening for this type of cancer. Each test has its own benefits and drawbacks. Discuss with your doctor what is most appropriate for you during your annual wellness visit. The different tests include: colonoscopy (considered the best screening method), a fecal occult blood test, a fecal DNA test, and sigmoidoscopy.  -Breast cancer screenings are recommended every other year for women of normal risk, age 17-74.  -Cervical cancer screenings for women over age 50 are only recommended with certain risk factors.   -All adults born between [birth year] and 1965 should be screened once for Hepatitis C.     Here is a list of your current Health Maintenance items (your personalized list of preventive services) with a due date:  Health Maintenance Due   Topic Date Due   ??? DTaP/Tdap/Td  (1 - Tdap) 05/13/1957   ??? Shingles Vaccine (1 of 2) 05/13/1986   ???  Pneumococcal Vaccine (2 of 2 - PCV13) 02/15/2012   ??? Glaucoma Screening   09/22/2017   ??? Flu Vaccine  04/05/2018

## 2018-03-29 NOTE — Progress Notes (Signed)
HISTORY OF PRESENT ILLNESS  Melanie Houston is a 84 y.o. female.  Thyroid Problem   The history is provided by the patient (followed by endocrine , stable ). This is a recurrent problem. The problem has not changed since onset.Pertinent negatives include no chest pain, no abdominal pain, no headaches and no shortness of breath.   Hypertension    The history is provided by the patient. This is a recurrent problem. The problem has not changed since onset.Pertinent negatives include no chest pain, no palpitations, no malaise/fatigue, no blurred vision, no headaches, no dizziness, no shortness of breath and no nausea.   Chronic Kidney Disease   The history is provided by the patient. This is a recurrent problem. The problem has not changed since onset.Pertinent negatives include no chest pain, no abdominal pain, no headaches and no shortness of breath.   Joint Pain    The history is provided by the patient. This is a recurrent problem. The problem has not changed since onset.  Eye Problem   The history is provided by the patient (glaucoma). This is a recurrent problem. The problem has not changed since onset.Pertinent negatives include no chest pain, no abdominal pain, no headaches and no shortness of breath.     Allergies   Allergen Reactions   ??? Azithromycin Unknown (comments)   ??? Celebrex [Celecoxib] Unknown (comments)   ??? Ibuprofen Unknown (comments)   ??? Penicillins Unknown (comments)   ??? Sulfa (Sulfonamide Antibiotics) Unknown (comments)   ??? Tetracyclines Unknown (comments)     Current Outpatient Medications   Medication Sig   ??? cholecalciferol (VITAMIN D3) 1,000 unit cap Take  by mouth daily (with dinner).   ??? OTHER,NON-FORMULARY, Unknown eye drop for glaucoma   ??? dorzolamide-timolol (COSOPT) 22.3-6.8 mg/mL ophthalmic solution INSTILL 1 DROP INTO BOTH EYES TWICE A DAY   ??? latanoprost (XALATAN) 0.005 % ophthalmic solution INSTILL 1 DROP INTO BOTH EYES AT BEDTIME   ??? cholecalciferol, vitamin D3, 2,000 unit tab  Take  by mouth.   ??? ascorbic acid (VITAMIN C) 500 mg tablet Strength: ; Form: ; SIG: take 1 tablet orally Daily   ??? metoprolol succinate (TOPROL-XL) 100 mg tablet Take 1 Tab by mouth daily.     No current facility-administered medications for this visit.      Past Medical History:   Diagnosis Date   ??? Arthritis     osteo-arthritis   ??? Cancer ([place])     Bilateral Breast Cancer   ??? Chronic kidney disease     followed by Dr. Stephens Shire   ??? Chronic UTI     Followed by Dr. Venia Minks, chronic macrobid   ??? Colon polyps    ??? Glaucoma    ??? Hematuria     followed by Dr. Irean Hong   ??? Hypertension    ??? Osteoporosis     followed by Dr. Debbra Riding   ??? Thyroid disease     low TSH followed by Dr. Janese Banks   ??? Vitamin D deficiency      Past Surgical History:   Procedure Laterality Date   ??? HX COLONOSCOPY  2006   ??? HX GYN      hysterectomy   ??? HX HEENT      tonsillectomy   ??? HX MASTECTOMY Bilateral 1988 and 2002   ??? PR COLONOSCOPY W/BIOPSY SINGLE/MULTIPLE  05/17/2017    polyp     Social History     Socioeconomic History   ??? Marital status: MARRIED  Spouse name: Not on file   ??? Number of children: Not on file   ??? Years of education: Not on file   ??? Highest education level: Not on file   Occupational History   ??? Not on file   Social Needs   ??? Financial resource strain: Not on file   ??? Food insecurity:     Worry: Not on file     Inability: Not on file   ??? Transportation needs:     Medical: Not on file     Non-medical: Not on file   Tobacco Use   ??? Smoking status: Never Smoker   ??? Smokeless tobacco: Never Used   Substance and Sexual Activity   ??? Alcohol use: No     Alcohol/week: 0.0 standard drinks   ??? Drug use: No   ??? Sexual activity: Not on file   Lifestyle   ??? Physical activity:     Days per week: Not on file     Minutes per session: Not on file   ??? Stress: Not on file   Relationships   ??? Social connections:     Talks on phone: Not on file     Gets together: Not on file     Attends religious service: Not on file     Active member of club or  organization: Not on file     Attends meetings of clubs or organizations: Not on file     Relationship status: Not on file   ??? Intimate partner violence:     Fear of current or ex partner: Not on file     Emotionally abused: Not on file     Physically abused: Not on file     Forced sexual activity: Not on file   Other Topics Concern   ??? Not on file   Social History Narrative   ??? Not on file         Review of Systems   Constitutional: Negative for chills, fever and malaise/fatigue.   HENT: Negative for congestion and hearing loss.    Eyes: Negative for blurred vision, pain and discharge.   Respiratory: Negative for cough, hemoptysis, shortness of breath and wheezing.    Cardiovascular: Negative for chest pain, palpitations and leg swelling.   Gastrointestinal: Negative for abdominal pain, blood in stool, constipation, diarrhea, heartburn and nausea.   Genitourinary: Negative for dysuria, frequency, hematuria and urgency.   Musculoskeletal: Positive for joint pain. Negative for myalgias.   Skin: Negative for rash.   Neurological: Negative for dizziness, sensory change and headaches.   Endo/Heme/Allergies: Negative for polydipsia. Does not bruise/bleed easily.   Psychiatric/Behavioral: Negative for depression. The patient is not nervous/anxious and does not have insomnia.    Blood pressure 150/76, pulse 65, height 5' 3.25" (1.607 m), weight 92 lb 9.6 oz (42 kg), SpO2 98 %.        Physical Exam   Constitutional: She is oriented to person, place, and time. She appears well-developed and well-nourished. No distress.   HENT:   Head: Atraumatic.   Right Ear: External ear normal.   Left Ear: External ear normal.   Nose: Nose normal.   Mouth/Throat: No oropharyngeal exudate.   Eyes: Pupils are equal, round, and reactive to light. Conjunctivae and EOM are normal. Right eye exhibits no discharge. Left eye exhibits no discharge. No scleral icterus.   Neck: Normal range of motion. No thyromegaly present.   Cardiovascular: Normal  rate, regular rhythm and normal heart sounds.  Exam reveals no gallop and no friction rub.   No murmur heard.  Pulmonary/Chest: Effort normal and breath sounds normal. No respiratory distress. She has no wheezes. She has no rales. She exhibits no tenderness.   Abdominal: She exhibits no distension and no mass. There is no tenderness. There is no rebound and no guarding.   Musculoskeletal: Normal range of motion. She exhibits no edema or tenderness.   Lymphadenopathy:     She has no cervical adenopathy.   Neurological: She is alert and oriented to person, place, and time. Coordination normal.   Skin: Skin is warm and dry.   Psychiatric: She has a normal mood and affect.   Nursing note and vitals reviewed.    Office Visit on 03/29/2018   Component Date Value Ref Range Status   ??? Color (UA POC) 03/29/2018 Yellow   Final   ??? Clarity (UA POC) 03/29/2018 Clear   Final   ??? Glucose (UA POC) 03/29/2018 Negative  Negative Final   ??? Bilirubin (UA POC) 03/29/2018 Negative  Negative Final   ??? Ketones (UA POC) 03/29/2018 Negative  Negative Final   ??? Specific gravity (UA POC) 03/29/2018 1.010  1.001 - 1.035 Final   ??? Blood (UA POC) 03/29/2018 Negative  Negative Final   ??? pH (UA POC) 03/29/2018 6.0  4.6 - 8.0 Final   ??? Protein (UA POC) 03/29/2018 Negative  Negative Final   ??? Urobilinogen (UA POC) 03/29/2018 normal  0.2 - 1 Final   ??? Nitrites (UA POC) 03/29/2018 Negative  Negative Final   ??? Leukocyte esterase (UA POC) 03/29/2018 Negative  Negative Final   ??? WBC 03/29/2018 8.1  3.4 - 10.8 x10E3/uL Final   ??? RBC 03/29/2018 4.55  3.77 - 5.28 x10E6/uL Final   ??? HGB 03/29/2018 14.7  11.1 - 15.9 g/dL Final   ??? HCT 03/29/2018 42.6  34.0 - 46.6 % Final   ??? MCV 03/29/2018 94  79 - 97 fL Final   ??? MCH 03/29/2018 32.3  26.6 - 33.0 pg Final   ??? MCHC 03/29/2018 34.5  31.5 - 35.7 g/dL Final   ??? RDW 03/29/2018 12.0* 12.3 - 15.4 % Final   ??? PLATELET 03/29/2018 188  150 - 450 x10E3/uL Final   ??? NEUTROPHILS 03/29/2018 67  Not Estab. % Final   ???  Lymphocytes 03/29/2018 20  Not Estab. % Final   ??? MONOCYTES 03/29/2018 11  Not Estab. % Final   ??? EOSINOPHILS 03/29/2018 1  Not Estab. % Final   ??? BASOPHILS 03/29/2018 1  Not Estab. % Final   ??? ABS. NEUTROPHILS 03/29/2018 5.5  1.4 - 7.0 x10E3/uL Final   ??? Abs Lymphocytes 03/29/2018 1.6  0.7 - 3.1 x10E3/uL Final   ??? ABS. MONOCYTES 03/29/2018 0.9  0.1 - 0.9 x10E3/uL Final   ??? ABS. EOSINOPHILS 03/29/2018 0.1  0.0 - 0.4 x10E3/uL Final   ??? ABS. BASOPHILS 03/29/2018 0.1  0.0 - 0.2 x10E3/uL Final   ??? IMMATURE GRANULOCYTES 03/29/2018 0  Not Estab. % Final   ??? ABS. IMM. GRANS. 03/29/2018 0.0  0.0 - 0.1 x10E3/uL Final   ??? Glucose 03/29/2018 91  65 - 99 mg/dL Final   ??? BUN 03/29/2018 20  8 - 27 mg/dL Final   ??? Creatinine 03/29/2018 1.09* 0.57 - 1.00 mg/dL Final   ??? GFR est non-AA 03/29/2018 48* >59 mL/min/1.73 Final   ??? GFR est AA 03/29/2018 55* >59 mL/min/1.73 Final   ??? BUN/Creatinine ratio 03/29/2018 18  12 - 28 Final   ???  Sodium 03/29/2018 141  134 - 144 mmol/L Final   ??? Potassium 03/29/2018 4.0  3.5 - 5.2 mmol/L Final   ??? Chloride 03/29/2018 103  96 - 106 mmol/L Final   ??? CO2 03/29/2018 24  20 - 29 mmol/L Final   ??? Calcium 03/29/2018 9.8  8.7 - 10.3 mg/dL Final   ??? Protein, total 03/29/2018 7.1  6.0 - 8.5 g/dL Final   ??? Albumin 03/29/2018 4.6  3.5 - 4.7 g/dL Final   ??? GLOBULIN, TOTAL 03/29/2018 2.5  1.5 - 4.5 g/dL Final   ??? A-G Ratio 03/29/2018 1.8  1.2 - 2.2 Final   ??? Bilirubin, total 03/29/2018 0.7  0.0 - 1.2 mg/dL Final   ??? Alk. phosphatase 03/29/2018 60  39 - 117 IU/L Final   ??? AST (SGOT) 03/29/2018 30  0 - 40 IU/L Final   ??? ALT (SGPT) 03/29/2018 27  0 - 32 IU/L Final   ??? Cholesterol, total 03/29/2018 222* 100 - 199 mg/dL Final   ??? Triglyceride 03/29/2018 62  0 - 149 mg/dL Final   ??? HDL Cholesterol 03/29/2018 90  >39 mg/dL Final   ??? VLDL, calculated 03/29/2018 12  5 - 40 mg/dL Final   ??? LDL, calculated 03/29/2018 120* 0 - 99 mg/dL Final   ??? LDL/HDL Ratio 03/29/2018 1.3  0.0 - 3.2 ratio Final    Comment:                                      LDL/HDL Ratio                                              Men  Women                                1/2 Avg.Risk  1.0    1.5                                    Avg.Risk  3.6    3.2                                 2X Avg.Risk  6.2    5.0                                 3X Avg.Risk  8.0    6.1     ??? TSH 03/29/2018 3.000  0.450 - 4.500 uIU/mL Final       ASSESSMENT and PLAN    ICD-10-CM ICD-9-CM    1. Essential hypertension I10 401.9 AMB POC URINALYSIS DIP STICK AUTO W/O MICRO      CBC WITH AUTOMATED DIFF      METABOLIC PANEL, COMPREHENSIVE      LIPID PANEL WITH LDL/HDL RATIO      TSH 3RD GENERATION   2. Thyroid disease E07.9 246.9 AMB POC URINALYSIS DIP STICK AUTO W/O MICRO      CBC WITH AUTOMATED DIFF      METABOLIC PANEL, COMPREHENSIVE  LIPID PANEL WITH LDL/HDL RATIO      TSH 3RD GENERATION   3. Vitamin D deficiency E55.9 268.9 AMB POC URINALYSIS DIP STICK AUTO W/O MICRO      CBC WITH AUTOMATED DIFF      METABOLIC PANEL, COMPREHENSIVE      LIPID PANEL WITH LDL/HDL RATIO      TSH 3RD GENERATION   4. Stage 1 chronic kidney disease N18.1 585.1 AMB POC URINALYSIS DIP STICK AUTO W/O MICRO      CBC WITH AUTOMATED DIFF      METABOLIC PANEL, COMPREHENSIVE      LIPID PANEL WITH LDL/HDL RATIO      TSH 3RD GENERATION   5. Cancer (HCC) C80.1 199.1 AMB POC URINALYSIS DIP STICK AUTO W/O MICRO      CBC WITH AUTOMATED DIFF      METABOLIC PANEL, COMPREHENSIVE      LIPID PANEL WITH LDL/HDL RATIO      TSH 3RD GENERATION   6. Arthritis M19.90 716.90 AMB POC URINALYSIS DIP STICK AUTO W/O MICRO      CBC WITH AUTOMATED DIFF      METABOLIC PANEL, COMPREHENSIVE      LIPID PANEL WITH LDL/HDL RATIO      TSH 3RD GENERATION   7. Osteoporosis, unspecified osteoporosis type, unspecified pathological fracture presence M81.0 733.00 AMB POC URINALYSIS DIP STICK AUTO W/O MICRO      CBC WITH AUTOMATED DIFF      METABOLIC PANEL, COMPREHENSIVE      LIPID PANEL WITH LDL/HDL RATIO      TSH 3RD GENERATION   8. Screening for  deficiency anemia Z13.0 V78.1 AMB POC URINALYSIS DIP STICK AUTO W/O MICRO      CBC WITH AUTOMATED DIFF      METABOLIC PANEL, COMPREHENSIVE      LIPID PANEL WITH LDL/HDL RATIO      TSH 3RD GENERATION   9. Memory loss, short term R41.3 780.93 REFERRAL TO NEUROLOGY   10. Medicare annual wellness visit, subsequent Z00.00 V70.0    11. Screening for alcoholism Z13.39 V79.1 PR ANNUAL ALCOHOL SCREEN 15 MIN      PR ANNUAL ALCOHOL SCREEN 15 MIN   12. Screening for depression Z13.31 V79.0 [place]     Diagnoses and all orders for this visit:    1. Essential hypertension  -     AMB POC URINALYSIS DIP STICK AUTO W/O MICRO  -     CBC WITH AUTOMATED DIFF  -     METABOLIC PANEL, COMPREHENSIVE  -     LIPID PANEL WITH LDL/HDL RATIO  -     TSH 3RD GENERATION    2. Thyroid disease  -     AMB POC URINALYSIS DIP STICK AUTO W/O MICRO  -     CBC WITH AUTOMATED DIFF  -     METABOLIC PANEL, COMPREHENSIVE  -     LIPID PANEL WITH LDL/HDL RATIO  -     TSH 3RD GENERATION    3. Vitamin D deficiency  -     AMB POC URINALYSIS DIP STICK AUTO W/O MICRO  -     CBC WITH AUTOMATED DIFF  -     METABOLIC PANEL, COMPREHENSIVE  -     LIPID PANEL WITH LDL/HDL RATIO  -     TSH 3RD GENERATION    4. Stage 1 chronic kidney disease  -     AMB POC URINALYSIS DIP  STICK AUTO W/O MICRO  -     CBC WITH AUTOMATED DIFF  -     METABOLIC PANEL, COMPREHENSIVE  -     LIPID PANEL WITH LDL/HDL RATIO  -     TSH 3RD GENERATION    5. Cancer ([place])  -     AMB POC URINALYSIS DIP STICK AUTO W/O MICRO  -     CBC WITH AUTOMATED DIFF  -     METABOLIC PANEL, COMPREHENSIVE  -     LIPID PANEL WITH LDL/HDL RATIO  -     TSH 3RD GENERATION    6. Arthritis  -     AMB POC URINALYSIS DIP STICK AUTO W/O MICRO  -     CBC WITH AUTOMATED DIFF  -     METABOLIC PANEL, COMPREHENSIVE  -     LIPID PANEL WITH LDL/HDL RATIO  -     TSH 3RD GENERATION    7. Osteoporosis, unspecified osteoporosis type, unspecified pathological fracture presence  -     AMB POC  URINALYSIS DIP STICK AUTO W/O MICRO  -     CBC WITH AUTOMATED DIFF  -     METABOLIC PANEL, COMPREHENSIVE  -     LIPID PANEL WITH LDL/HDL RATIO  -     TSH 3RD GENERATION    8. Screening for deficiency anemia  -     AMB POC URINALYSIS DIP STICK AUTO W/O MICRO  -     CBC WITH AUTOMATED DIFF  -     METABOLIC PANEL, COMPREHENSIVE  -     LIPID PANEL WITH LDL/HDL RATIO  -     TSH 3RD GENERATION    9. Memory loss, short term  -     REFERRAL TO NEUROLOGY    10. Medicare annual wellness visit, subsequent    11. Screening for alcoholism  -     PR ANNUAL ALCOHOL SCREEN 15 MIN  -     PR ANNUAL ALCOHOL SCREEN 15 MIN    12. Screening for depression  -     [place]      current treatment plan is effective, no change in therapy

## 2018-03-29 NOTE — Addendum Note (Signed)
Addendum  Note by Jeanelle MallingWhite, Sharlize Hoar T, MD at 03/29/18 0800                Author: Jeanelle MallingWhite, Tereasa Yilmaz T, MD  Service: --  Author Type: Physician       Filed: 06/04/18 [id number]  Encounter Date: 03/29/2018  Status: Signed          Editor: Jeanelle MallingWhite, Deshae Dickison T, MD (Physician)          Addended by: Almedia BallsWHITE, Starlynn Klinkner T on: 06/04/2018 09:06 PM    Modules accepted: Orders, Level of Service, SmartSet

## 2018-03-29 NOTE — Progress Notes (Signed)
This is the Subsequent Medicare Annual Wellness Exam, performed 12 months or more after the Initial AWV or the last Subsequent AWV    I have reviewed the patient's medical history in detail and updated the computerized patient record.     History     Past Medical History:   Diagnosis Date   ??? Arthritis     osteo-arthritis   ??? Cancer (HCC)     Bilateral Breast Cancer   ??? Chronic kidney disease     followed by Dr. Dora Sims   ??? Chronic UTI     Followed by Dr. Elease Hashimoto, chronic macrobid   ??? Colon polyps    ??? Glaucoma    ??? Hematuria     followed by Dr. Thurston Hole   ??? Hypertension    ??? Osteoporosis     followed by Dr. Odis Luster   ??? Thyroid disease     low TSH followed by Dr. Mickie Kay   ??? Vitamin D deficiency       Past Surgical History:   Procedure Laterality Date   ??? HX COLONOSCOPY  2006   ??? HX GYN      hysterectomy   ??? HX HEENT      tonsillectomy   ??? HX MASTECTOMY Bilateral 1988 and 2002   ??? PR COLONOSCOPY W/BIOPSY SINGLE/MULTIPLE  05/17/2017    polyp     Current Outpatient Medications   Medication Sig Dispense Refill   ??? cholecalciferol (VITAMIN D3) 1,000 unit cap Take  by mouth daily (with dinner).     ??? OTHER,NON-FORMULARY, Unknown eye drop for glaucoma     ??? dorzolamide-timolol (COSOPT) 22.3-6.8 mg/mL ophthalmic solution INSTILL 1 DROP INTO BOTH EYES TWICE A DAY  4   ??? latanoprost (XALATAN) 0.005 % ophthalmic solution INSTILL 1 DROP INTO BOTH EYES AT BEDTIME  11   ??? cholecalciferol, vitamin D3, 2,000 unit tab Take  by mouth.     ??? ascorbic acid (VITAMIN C) 500 mg tablet Strength: ; Form: ; SIG: take 1 tablet orally Daily     ??? metoprolol succinate (TOPROL-XL) 100 mg tablet Take 1 Tab by mouth daily. 90 Tab 1     Allergies   Allergen Reactions   ??? Azithromycin Unknown (comments)   ??? Celebrex [Celecoxib] Unknown (comments)   ??? Ibuprofen Unknown (comments)   ??? Penicillins Unknown (comments)   ??? Sulfa (Sulfonamide Antibiotics) Unknown (comments)   ??? Tetracyclines Unknown (comments)     Family History   Problem Relation Age of  Onset   ??? Cancer Mother    ??? Heart Disease Father      Social History     Tobacco Use   ??? Smoking status: Never Smoker   ??? Smokeless tobacco: Never Used   Substance Use Topics   ??? Alcohol use: No     Alcohol/week: 0.0 standard drinks     Patient Active Problem List   Diagnosis Code   ??? Thyroid disease E07.9   ??? Hypertension I10   ??? Chronic kidney disease N18.9   ??? Cancer (HCC) C80.1   ??? Osteoporosis M81.0   ??? Arthritis M19.90   ??? Vitamin D deficiency E55.9   ??? Glaucoma H40.9       Depression Risk Factor Screening:     3 most recent PHQ Screens 03/29/2018   PHQ Not Done -   Little interest or pleasure in doing things Not at all   Feeling down, depressed, irritable, or hopeless Not at all   Total Score PHQ  2 0     Alcohol Risk Factor Screening:   You do not drink alcohol or very rarely.    Functional Ability and Level of Safety:   Hearing Loss  Hearing is good.    Activities of Daily Living  The home contains: no safety equipment.  Patient does total self care    Fall Risk  Fall Risk Assessment, last 12 mths 03/29/2018   Able to walk? Yes   Fall in past 12 months? No       Abuse Screen  Patient is not abused    Cognitive Screening   Evaluation of Cognitive Function:  Has your family/caregiver stated any concerns about your memory: no  Normal    Patient Care Team   Patient Care Team:  Jeanelle MallingWhite, Shonia Skilling T, MD as PCP - General (Family Practice)    Assessment/Plan   Education and counseling provided:  Are appropriate based on today's review and evaluation    Diagnoses and all orders for this visit:    1. Essential hypertension  -     AMB POC URINALYSIS DIP STICK AUTO W/O MICRO  -     CBC WITH AUTOMATED DIFF  -     METABOLIC PANEL, COMPREHENSIVE  -     LIPID PANEL WITH LDL/HDL RATIO  -     TSH 3RD GENERATION    2. Thyroid disease  -     AMB POC URINALYSIS DIP STICK AUTO W/O MICRO  -     CBC WITH AUTOMATED DIFF  -     METABOLIC PANEL, COMPREHENSIVE  -     LIPID PANEL WITH LDL/HDL RATIO  -     TSH 3RD GENERATION    3. Vitamin D  deficiency  -     AMB POC URINALYSIS DIP STICK AUTO W/O MICRO  -     CBC WITH AUTOMATED DIFF  -     METABOLIC PANEL, COMPREHENSIVE  -     LIPID PANEL WITH LDL/HDL RATIO  -     TSH 3RD GENERATION    4. Stage 1 chronic kidney disease  -     AMB POC URINALYSIS DIP STICK AUTO W/O MICRO  -     CBC WITH AUTOMATED DIFF  -     METABOLIC PANEL, COMPREHENSIVE  -     LIPID PANEL WITH LDL/HDL RATIO  -     TSH 3RD GENERATION    5. Cancer (HCC)  -     AMB POC URINALYSIS DIP STICK AUTO W/O MICRO  -     CBC WITH AUTOMATED DIFF  -     METABOLIC PANEL, COMPREHENSIVE  -     LIPID PANEL WITH LDL/HDL RATIO  -     TSH 3RD GENERATION    6. Arthritis  -     AMB POC URINALYSIS DIP STICK AUTO W/O MICRO  -     CBC WITH AUTOMATED DIFF  -     METABOLIC PANEL, COMPREHENSIVE  -     LIPID PANEL WITH LDL/HDL RATIO  -     TSH 3RD GENERATION    7. Osteoporosis, unspecified osteoporosis type, unspecified pathological fracture presence  -     AMB POC URINALYSIS DIP STICK AUTO W/O MICRO  -     CBC WITH AUTOMATED DIFF  -     METABOLIC PANEL, COMPREHENSIVE  -     LIPID PANEL WITH LDL/HDL RATIO  -     TSH 3RD GENERATION    8. Screening  for deficiency anemia  -     AMB POC URINALYSIS DIP STICK AUTO W/O MICRO  -     CBC WITH AUTOMATED DIFF  -     METABOLIC PANEL, COMPREHENSIVE  -     LIPID PANEL WITH LDL/HDL RATIO  -     TSH 3RD GENERATION    9. Memory loss, short term  -     REFERRAL TO NEUROLOGY    10. Medicare annual wellness visit, subsequent    11. Screening for alcoholism  -     PR ANNUAL ALCOHOL SCREEN 15 MIN    12. Screening for depression  -     DEPRESSION SCREEN ANNUAL        Health Maintenance Due   Topic Date Due   ??? DTaP/Tdap/Td series (1 - Tdap) 05/13/1955   ??? Shingrix Vaccine Age 30> (1 of 2) 05/13/1986   ??? Pneumococcal 65+ years (2 of 2 - PCV13) 02/15/2012   ??? GLAUCOMA SCREENING Q2Y  09/22/2017   ??? Influenza Age 40 to Adult  04/05/2018

## 2018-03-29 NOTE — Progress Notes (Signed)
HISTORY OF PRESENT ILLNESS  Melanie Houston is a 84 y.o. female.  Thyroid Problem   The history is provided by the patient (followed by endocrine , stable ). This is a recurrent problem. The problem has not changed since onset.Pertinent negatives include no chest pain, no abdominal pain, no headaches and no shortness of breath.   Hypertension    The history is provided by the patient. This is a recurrent problem. The problem has not changed since onset.Pertinent negatives include no chest pain, no palpitations, no malaise/fatigue, no blurred vision, no headaches, no dizziness, no shortness of breath and no nausea.   Chronic Kidney Disease   The history is provided by the patient. This is a recurrent problem. The problem has not changed since onset.Pertinent negatives include no chest pain, no abdominal pain, no headaches and no shortness of breath.   Joint Pain    The history is provided by the patient. This is a recurrent problem. The problem has not changed since onset.  Eye Problem   The history is provided by the patient (glaucoma). This is a recurrent problem. The problem has not changed since onset.Pertinent negatives include no chest pain, no abdominal pain, no headaches and no shortness of breath.     Allergies   Allergen Reactions   ??? Azithromycin Unknown (comments)   ??? Celebrex [Celecoxib] Unknown (comments)   ??? Ibuprofen Unknown (comments)   ??? Penicillins Unknown (comments)   ??? Sulfa (Sulfonamide Antibiotics) Unknown (comments)   ??? Tetracyclines Unknown (comments)     Current Outpatient Medications   Medication Sig   ??? cholecalciferol (VITAMIN D3) 1,000 unit cap Take  by mouth daily (with dinner).   ??? OTHER,NON-FORMULARY, Unknown eye drop for glaucoma   ??? dorzolamide-timolol (COSOPT) 22.3-6.8 mg/mL ophthalmic solution INSTILL 1 DROP INTO BOTH EYES TWICE A DAY   ??? latanoprost (XALATAN) 0.005 % ophthalmic solution INSTILL 1 DROP INTO BOTH EYES AT BEDTIME   ??? cholecalciferol, vitamin D3, 2,000 unit tab  Take  by mouth.   ??? ascorbic acid (VITAMIN C) 500 mg tablet Strength: ; Form: ; SIG: take 1 tablet orally Daily   ??? metoprolol succinate (TOPROL-XL) 100 mg tablet Take 1 Tab by mouth daily.     No current facility-administered medications for this visit.      Past Medical History:   Diagnosis Date   ??? Arthritis     osteo-arthritis   ??? Cancer ([place])     Bilateral Breast Cancer   ??? Chronic kidney disease     followed by Dr. Stephens Shire   ??? Chronic UTI     Followed by Dr. Venia Minks, chronic macrobid   ??? Colon polyps    ??? Glaucoma    ??? Hematuria     followed by Dr. Irean Hong   ??? Hypertension    ??? Osteoporosis     followed by Dr. Debbra Riding   ??? Thyroid disease     low TSH followed by Dr. Janese Banks   ??? Vitamin D deficiency      Past Surgical History:   Procedure Laterality Date   ??? HX COLONOSCOPY  2006   ??? HX GYN      hysterectomy   ??? HX HEENT      tonsillectomy   ??? HX MASTECTOMY Bilateral 1988 and 2002   ??? PR COLONOSCOPY W/BIOPSY SINGLE/MULTIPLE  05/17/2017    polyp     Social History     Socioeconomic History   ??? Marital status: MARRIED  Spouse name: Not on file   ??? Number of children: Not on file   ??? Years of education: Not on file   ??? Highest education level: Not on file   Occupational History   ??? Not on file   Social Needs   ??? Financial resource strain: Not on file   ??? Food insecurity:     Worry: Not on file     Inability: Not on file   ??? Transportation needs:     Medical: Not on file     Non-medical: Not on file   Tobacco Use   ??? Smoking status: Never Smoker   ??? Smokeless tobacco: Never Used   Substance and Sexual Activity   ??? Alcohol use: No     Alcohol/week: 0.0 standard drinks   ??? Drug use: No   ??? Sexual activity: Not on file   Lifestyle   ??? Physical activity:     Days per week: Not on file     Minutes per session: Not on file   ??? Stress: Not on file   Relationships   ??? Social connections:     Talks on phone: Not on file     Gets together: Not on file     Attends religious service: Not on file     Active member of club or  organization: Not on file     Attends meetings of clubs or organizations: Not on file     Relationship status: Not on file   ??? Intimate partner violence:     Fear of current or ex partner: Not on file     Emotionally abused: Not on file     Physically abused: Not on file     Forced sexual activity: Not on file   Other Topics Concern   ??? Not on file   Social History Narrative   ??? Not on file         Review of Systems   Constitutional: Negative for chills, fever and malaise/fatigue.   HENT: Negative for congestion and hearing loss.    Eyes: Negative for blurred vision, pain and discharge.   Respiratory: Negative for cough, hemoptysis, shortness of breath and wheezing.    Cardiovascular: Negative for chest pain, palpitations and leg swelling.   Gastrointestinal: Negative for abdominal pain, blood in stool, constipation, diarrhea, heartburn and nausea.   Genitourinary: Negative for dysuria, frequency, hematuria and urgency.   Musculoskeletal: Positive for joint pain. Negative for myalgias.   Skin: Negative for rash.   Neurological: Negative for dizziness, sensory change and headaches.   Endo/Heme/Allergies: Negative for polydipsia. Does not bruise/bleed easily.   Psychiatric/Behavioral: Negative for depression. The patient is not nervous/anxious and does not have insomnia.    Blood pressure 150/76, pulse 65, height 5' 3.25" (1.607 m), weight 92 lb 9.6 oz (42 kg), SpO2 98 %.        Physical Exam   Constitutional: She is oriented to person, place, and time. She appears well-developed and well-nourished. No distress.   HENT:   Head: Atraumatic.   Right Ear: External ear normal.   Left Ear: External ear normal.   Nose: Nose normal.   Mouth/Throat: No oropharyngeal exudate.   Eyes: Pupils are equal, round, and reactive to light. Conjunctivae and EOM are normal. Right eye exhibits no discharge. Left eye exhibits no discharge. No scleral icterus.   Neck: Normal range of motion. No thyromegaly present.   Cardiovascular: Normal  rate, regular rhythm and normal heart sounds.  Exam reveals no gallop and no friction rub.   No murmur heard.  Pulmonary/Chest: Effort normal and breath sounds normal. No respiratory distress. She has no wheezes. She has no rales. She exhibits no tenderness.   Abdominal: She exhibits no distension and no mass. There is no tenderness. There is no rebound and no guarding.   Musculoskeletal: Normal range of motion. She exhibits no edema or tenderness.   Lymphadenopathy:     She has no cervical adenopathy.   Neurological: She is alert and oriented to person, place, and time. Coordination normal.   Skin: Skin is warm and dry.   Psychiatric: She has a normal mood and affect.   Nursing note and vitals reviewed.    Office Visit on 03/29/2018   Component Date Value Ref Range Status   ??? Color (UA POC) 03/29/2018 Yellow   Final   ??? Clarity (UA POC) 03/29/2018 Clear   Final   ??? Glucose (UA POC) 03/29/2018 Negative  Negative Final   ??? Bilirubin (UA POC) 03/29/2018 Negative  Negative Final   ??? Ketones (UA POC) 03/29/2018 Negative  Negative Final   ??? Specific gravity (UA POC) 03/29/2018 1.010  1.001 - 1.035 Final   ??? Blood (UA POC) 03/29/2018 Negative  Negative Final   ??? pH (UA POC) 03/29/2018 6.0  4.6 - 8.0 Final   ??? Protein (UA POC) 03/29/2018 Negative  Negative Final   ??? Urobilinogen (UA POC) 03/29/2018 normal  0.2 - 1 Final   ??? Nitrites (UA POC) 03/29/2018 Negative  Negative Final   ??? Leukocyte esterase (UA POC) 03/29/2018 Negative  Negative Final   ??? WBC 03/29/2018 8.1  3.4 - 10.8 x10E3/uL Final   ??? RBC 03/29/2018 4.55  3.77 - 5.28 x10E6/uL Final   ??? HGB 03/29/2018 14.7  11.1 - 15.9 g/dL Final   ??? HCT 03/29/2018 42.6  34.0 - 46.6 % Final   ??? MCV 03/29/2018 94  79 - 97 fL Final   ??? MCH 03/29/2018 32.3  26.6 - 33.0 pg Final   ??? MCHC 03/29/2018 34.5  31.5 - 35.7 g/dL Final   ??? RDW 03/29/2018 12.0* 12.3 - 15.4 % Final   ??? PLATELET 03/29/2018 188  150 - 450 x10E3/uL Final   ??? NEUTROPHILS 03/29/2018 67  Not Estab. % Final   ???  Lymphocytes 03/29/2018 20  Not Estab. % Final   ??? MONOCYTES 03/29/2018 11  Not Estab. % Final   ??? EOSINOPHILS 03/29/2018 1  Not Estab. % Final   ??? BASOPHILS 03/29/2018 1  Not Estab. % Final   ??? ABS. NEUTROPHILS 03/29/2018 5.5  1.4 - 7.0 x10E3/uL Final   ??? Abs Lymphocytes 03/29/2018 1.6  0.7 - 3.1 x10E3/uL Final   ??? ABS. MONOCYTES 03/29/2018 0.9  0.1 - 0.9 x10E3/uL Final   ??? ABS. EOSINOPHILS 03/29/2018 0.1  0.0 - 0.4 x10E3/uL Final   ??? ABS. BASOPHILS 03/29/2018 0.1  0.0 - 0.2 x10E3/uL Final   ??? IMMATURE GRANULOCYTES 03/29/2018 0  Not Estab. % Final   ??? ABS. IMM. GRANS. 03/29/2018 0.0  0.0 - 0.1 x10E3/uL Final   ??? Glucose 03/29/2018 91  65 - 99 mg/dL Final   ??? BUN 03/29/2018 20  8 - 27 mg/dL Final   ??? Creatinine 03/29/2018 1.09* 0.57 - 1.00 mg/dL Final   ??? GFR est non-AA 03/29/2018 48* >59 mL/min/1.73 Final   ??? GFR est AA 03/29/2018 55* >59 mL/min/1.73 Final   ??? BUN/Creatinine ratio 03/29/2018 18  12 - 28 Final   ???  Sodium 03/29/2018 141  134 - 144 mmol/L Final   ??? Potassium 03/29/2018 4.0  3.5 - 5.2 mmol/L Final   ??? Chloride 03/29/2018 103  96 - 106 mmol/L Final   ??? CO2 03/29/2018 24  20 - 29 mmol/L Final   ??? Calcium 03/29/2018 9.8  8.7 - 10.3 mg/dL Final   ??? Protein, total 03/29/2018 7.1  6.0 - 8.5 g/dL Final   ??? Albumin 03/29/2018 4.6  3.5 - 4.7 g/dL Final   ??? GLOBULIN, TOTAL 03/29/2018 2.5  1.5 - 4.5 g/dL Final   ??? A-G Ratio 03/29/2018 1.8  1.2 - 2.2 Final   ??? Bilirubin, total 03/29/2018 0.7  0.0 - 1.2 mg/dL Final   ??? Alk. phosphatase 03/29/2018 60  39 - 117 IU/L Final   ??? AST (SGOT) 03/29/2018 30  0 - 40 IU/L Final   ??? ALT (SGPT) 03/29/2018 27  0 - 32 IU/L Final   ??? Cholesterol, total 03/29/2018 222* 100 - 199 mg/dL Final   ??? Triglyceride 03/29/2018 62  0 - 149 mg/dL Final   ??? HDL Cholesterol 03/29/2018 90  >39 mg/dL Final   ??? VLDL, calculated 03/29/2018 12  5 - 40 mg/dL Final   ??? LDL, calculated 03/29/2018 120* 0 - 99 mg/dL Final   ??? LDL/HDL Ratio 03/29/2018 1.3  0.0 - 3.2 ratio Final    Comment:                                      LDL/HDL Ratio                                              Men  Women                                1/2 Avg.Risk  1.0    1.5                                    Avg.Risk  3.6    3.2                                 2X Avg.Risk  6.2    5.0                                 3X Avg.Risk  8.0    6.1     ??? TSH 03/29/2018 3.000  0.450 - 4.500 uIU/mL Final       ASSESSMENT and PLAN    ICD-10-CM ICD-9-CM    1. Essential hypertension I10 401.9 AMB POC URINALYSIS DIP STICK AUTO W/O MICRO      CBC WITH AUTOMATED DIFF      METABOLIC PANEL, COMPREHENSIVE      LIPID PANEL WITH LDL/HDL RATIO      TSH 3RD GENERATION   2. Thyroid disease E07.9 246.9 AMB POC URINALYSIS DIP STICK AUTO W/O MICRO      CBC WITH AUTOMATED DIFF      METABOLIC PANEL, COMPREHENSIVE  LIPID PANEL WITH LDL/HDL RATIO      TSH 3RD GENERATION   3. Vitamin D deficiency E55.9 268.9 AMB POC URINALYSIS DIP STICK AUTO W/O MICRO      CBC WITH AUTOMATED DIFF      METABOLIC PANEL, COMPREHENSIVE      LIPID PANEL WITH LDL/HDL RATIO      TSH 3RD GENERATION   4. Stage 1 chronic kidney disease N18.1 585.1 AMB POC URINALYSIS DIP STICK AUTO W/O MICRO      CBC WITH AUTOMATED DIFF      METABOLIC PANEL, COMPREHENSIVE      LIPID PANEL WITH LDL/HDL RATIO      TSH 3RD GENERATION   5. Cancer (HCC) C80.1 199.1 AMB POC URINALYSIS DIP STICK AUTO W/O MICRO      CBC WITH AUTOMATED DIFF      METABOLIC PANEL, COMPREHENSIVE      LIPID PANEL WITH LDL/HDL RATIO      TSH 3RD GENERATION   6. Arthritis M19.90 716.90 AMB POC URINALYSIS DIP STICK AUTO W/O MICRO      CBC WITH AUTOMATED DIFF      METABOLIC PANEL, COMPREHENSIVE      LIPID PANEL WITH LDL/HDL RATIO      TSH 3RD GENERATION   7. Osteoporosis, unspecified osteoporosis type, unspecified pathological fracture presence M81.0 733.00 AMB POC URINALYSIS DIP STICK AUTO W/O MICRO      CBC WITH AUTOMATED DIFF      METABOLIC PANEL, COMPREHENSIVE      LIPID PANEL WITH LDL/HDL RATIO      TSH 3RD GENERATION   8. Screening for  deficiency anemia Z13.0 V78.1 AMB POC URINALYSIS DIP STICK AUTO W/O MICRO      CBC WITH AUTOMATED DIFF      METABOLIC PANEL, COMPREHENSIVE      LIPID PANEL WITH LDL/HDL RATIO      TSH 3RD GENERATION   9. Memory loss, short term R41.3 780.93 REFERRAL TO NEUROLOGY   10. Medicare annual wellness visit, subsequent Z00.00 V70.0    11. Screening for alcoholism Z13.39 V79.1 PR ANNUAL ALCOHOL SCREEN 15 MIN      PR ANNUAL ALCOHOL SCREEN 15 MIN   12. Screening for depression Z13.31 V79.0 [place]     Diagnoses and all orders for this visit:    1. Essential hypertension  -     AMB POC URINALYSIS DIP STICK AUTO W/O MICRO  -     CBC WITH AUTOMATED DIFF  -     METABOLIC PANEL, COMPREHENSIVE  -     LIPID PANEL WITH LDL/HDL RATIO  -     TSH 3RD GENERATION    2. Thyroid disease  -     AMB POC URINALYSIS DIP STICK AUTO W/O MICRO  -     CBC WITH AUTOMATED DIFF  -     METABOLIC PANEL, COMPREHENSIVE  -     LIPID PANEL WITH LDL/HDL RATIO  -     TSH 3RD GENERATION    3. Vitamin D deficiency  -     AMB POC URINALYSIS DIP STICK AUTO W/O MICRO  -     CBC WITH AUTOMATED DIFF  -     METABOLIC PANEL, COMPREHENSIVE  -     LIPID PANEL WITH LDL/HDL RATIO  -     TSH 3RD GENERATION    4. Stage 1 chronic kidney disease  -     AMB POC URINALYSIS DIP  STICK AUTO W/O MICRO  -     CBC WITH AUTOMATED DIFF  -     METABOLIC PANEL, COMPREHENSIVE  -     LIPID PANEL WITH LDL/HDL RATIO  -     TSH 3RD GENERATION    5. Cancer ([place])  -     AMB POC URINALYSIS DIP STICK AUTO W/O MICRO  -     CBC WITH AUTOMATED DIFF  -     METABOLIC PANEL, COMPREHENSIVE  -     LIPID PANEL WITH LDL/HDL RATIO  -     TSH 3RD GENERATION    6. Arthritis  -     AMB POC URINALYSIS DIP STICK AUTO W/O MICRO  -     CBC WITH AUTOMATED DIFF  -     METABOLIC PANEL, COMPREHENSIVE  -     LIPID PANEL WITH LDL/HDL RATIO  -     TSH 3RD GENERATION    7. Osteoporosis, unspecified osteoporosis type, unspecified pathological fracture presence  -     AMB POC  URINALYSIS DIP STICK AUTO W/O MICRO  -     CBC WITH AUTOMATED DIFF  -     METABOLIC PANEL, COMPREHENSIVE  -     LIPID PANEL WITH LDL/HDL RATIO  -     TSH 3RD GENERATION    8. Screening for deficiency anemia  -     AMB POC URINALYSIS DIP STICK AUTO W/O MICRO  -     CBC WITH AUTOMATED DIFF  -     METABOLIC PANEL, COMPREHENSIVE  -     LIPID PANEL WITH LDL/HDL RATIO  -     TSH 3RD GENERATION    9. Memory loss, short term  -     REFERRAL TO NEUROLOGY    10. Medicare annual wellness visit, subsequent    11. Screening for alcoholism  -     PR ANNUAL ALCOHOL SCREEN 15 MIN  -     PR ANNUAL ALCOHOL SCREEN 15 MIN    12. Screening for depression  -     [place]      current treatment plan is effective, no change in therapy

## 2018-03-29 NOTE — Progress Notes (Signed)
Notified via my chart.

## 2018-03-29 NOTE — Progress Notes (Signed)
This is the Subsequent Medicare Annual Wellness Exam, performed 12 months or more after the Initial AWV or the last Subsequent AWV    I have reviewed the patient's medical history in detail and updated the computerized patient record.     History     Past Medical History:   Diagnosis Date   ??? Arthritis     osteo-arthritis   ??? Cancer (HCC)     Bilateral Breast Cancer   ??? Chronic kidney disease     followed by Dr. Tsai   ??? Chronic UTI     Followed by Dr. Maloney, chronic macrobid   ??? Colon polyps    ??? Glaucoma    ??? Hematuria     followed by Dr. Stoner--GYN   ??? Hypertension    ??? Osteoporosis     followed by Dr. lipsey   ??? Thyroid disease     low TSH followed by Dr. Rahmani   ??? Vitamin D deficiency       Past Surgical History:   Procedure Laterality Date   ??? HX COLONOSCOPY  2006   ??? HX GYN      hysterectomy   ??? HX HEENT      tonsillectomy   ??? HX MASTECTOMY Bilateral 1988 and 2002   ??? PR COLONOSCOPY W/BIOPSY SINGLE/MULTIPLE  05/17/2017    polyp     Current Outpatient Medications   Medication Sig Dispense Refill   ??? cholecalciferol (VITAMIN D3) 1,000 unit cap Take  by mouth daily (with dinner).     ??? OTHER,NON-FORMULARY, Unknown eye drop for glaucoma     ??? dorzolamide-timolol (COSOPT) 22.3-6.8 mg/mL ophthalmic solution INSTILL 1 DROP INTO BOTH EYES TWICE A DAY  4   ??? latanoprost (XALATAN) 0.005 % ophthalmic solution INSTILL 1 DROP INTO BOTH EYES AT BEDTIME  11   ??? cholecalciferol, vitamin D3, 2,000 unit tab Take  by mouth.     ??? ascorbic acid (VITAMIN C) 500 mg tablet Strength: ; Form: ; SIG: take 1 tablet orally Daily     ??? metoprolol succinate (TOPROL-XL) 100 mg tablet Take 1 Tab by mouth daily. 90 Tab 1     Allergies   Allergen Reactions   ??? Azithromycin Unknown (comments)   ??? Celebrex [Celecoxib] Unknown (comments)   ??? Ibuprofen Unknown (comments)   ??? Penicillins Unknown (comments)   ??? Sulfa (Sulfonamide Antibiotics) Unknown (comments)   ??? Tetracyclines Unknown (comments)     Family History   Problem Relation Age of  Onset   ??? Cancer Mother    ??? Heart Disease Father      Social History     Tobacco Use   ??? Smoking status: Never Smoker   ??? Smokeless tobacco: Never Used   Substance Use Topics   ??? Alcohol use: No     Alcohol/week: 0.0 standard drinks     Patient Active Problem List   Diagnosis Code   ??? Thyroid disease E07.9   ??? Hypertension I10   ??? Chronic kidney disease N18.9   ??? Cancer (HCC) C80.1   ??? Osteoporosis M81.0   ??? Arthritis M19.90   ??? Vitamin D deficiency E55.9   ??? Glaucoma H40.9       Depression Risk Factor Screening:     3 most recent PHQ Screens 03/29/2018   PHQ Not Done -   Little interest or pleasure in doing things Not at all   Feeling down, depressed, irritable, or hopeless Not at all   Total Score PHQ   2 0     Alcohol Risk Factor Screening:   You do not drink alcohol or very rarely.    Functional Ability and Level of Safety:   Hearing Loss  Hearing is good.    Activities of Daily Living  The home contains: no safety equipment.  Patient does total self care    Fall Risk  Fall Risk Assessment, last 12 mths 03/29/2018   Able to walk? Yes   Fall in past 12 months? No       Abuse Screen  Patient is not abused    Cognitive Screening   Evaluation of Cognitive Function:  Has your family/caregiver stated any concerns about your memory: no  Normal    Patient Care Team   Patient Care Team:  Norwood Quezada T, MD as PCP - General (Family Practice)    Assessment/Plan   Education and counseling provided:  Are appropriate based on today's review and evaluation    Diagnoses and all orders for this visit:    1. Essential hypertension  -     AMB POC URINALYSIS DIP STICK AUTO W/O MICRO  -     CBC WITH AUTOMATED DIFF  -     METABOLIC PANEL, COMPREHENSIVE  -     LIPID PANEL WITH LDL/HDL RATIO  -     TSH 3RD GENERATION    2. Thyroid disease  -     AMB POC URINALYSIS DIP STICK AUTO W/O MICRO  -     CBC WITH AUTOMATED DIFF  -     METABOLIC PANEL, COMPREHENSIVE  -     LIPID PANEL WITH LDL/HDL RATIO  -     TSH 3RD GENERATION    3. Vitamin D  deficiency  -     AMB POC URINALYSIS DIP STICK AUTO W/O MICRO  -     CBC WITH AUTOMATED DIFF  -     METABOLIC PANEL, COMPREHENSIVE  -     LIPID PANEL WITH LDL/HDL RATIO  -     TSH 3RD GENERATION    4. Stage 1 chronic kidney disease  -     AMB POC URINALYSIS DIP STICK AUTO W/O MICRO  -     CBC WITH AUTOMATED DIFF  -     METABOLIC PANEL, COMPREHENSIVE  -     LIPID PANEL WITH LDL/HDL RATIO  -     TSH 3RD GENERATION    5. Cancer (HCC)  -     AMB POC URINALYSIS DIP STICK AUTO W/O MICRO  -     CBC WITH AUTOMATED DIFF  -     METABOLIC PANEL, COMPREHENSIVE  -     LIPID PANEL WITH LDL/HDL RATIO  -     TSH 3RD GENERATION    6. Arthritis  -     AMB POC URINALYSIS DIP STICK AUTO W/O MICRO  -     CBC WITH AUTOMATED DIFF  -     METABOLIC PANEL, COMPREHENSIVE  -     LIPID PANEL WITH LDL/HDL RATIO  -     TSH 3RD GENERATION    7. Osteoporosis, unspecified osteoporosis type, unspecified pathological fracture presence  -     AMB POC URINALYSIS DIP STICK AUTO W/O MICRO  -     CBC WITH AUTOMATED DIFF  -     METABOLIC PANEL, COMPREHENSIVE  -     LIPID PANEL WITH LDL/HDL RATIO  -     TSH 3RD GENERATION    8. Screening   for deficiency anemia  -     AMB POC URINALYSIS DIP STICK AUTO W/O MICRO  -     CBC WITH AUTOMATED DIFF  -     METABOLIC PANEL, COMPREHENSIVE  -     LIPID PANEL WITH LDL/HDL RATIO  -     TSH 3RD GENERATION    9. Memory loss, short term  -     REFERRAL TO NEUROLOGY    10. Medicare annual wellness visit, subsequent    11. Screening for alcoholism  -     PR ANNUAL ALCOHOL SCREEN 15 MIN    12. Screening for depression  -     DEPRESSION SCREEN ANNUAL        Health Maintenance Due   Topic Date Due   ??? DTaP/Tdap/Td series (1 - Tdap) 05/13/1955   ??? Shingrix Vaccine Age 50> (1 of 2) 05/13/1986   ??? Pneumococcal 65+ years (2 of 2 - PCV13) 02/15/2012   ??? GLAUCOMA SCREENING Q2Y  09/22/2017   ??? Influenza Age 9 to Adult  04/05/2018

## 2018-03-30 LAB — COMPREHENSIVE METABOLIC PANEL
ALT: 27 IU/L (ref 0–32)
AST: 30 IU/L (ref 0–40)
Albumin/Globulin Ratio: 1.8 NA (ref 1.2–2.2)
Albumin: 4.6 g/dL (ref 3.5–4.7)
Alkaline Phosphatase: 60 IU/L (ref 39–117)
BUN: 20 mg/dL (ref 8–27)
Bun/Cre Ratio: 18 NA (ref 12–28)
CO2: 24 mmol/L (ref 20–29)
Calcium: 9.8 mg/dL (ref 8.7–10.3)
Chloride: 103 mmol/L (ref 96–106)
Creatinine: 1.09 mg/dL — ABNORMAL HIGH (ref 0.57–1.00)
EGFR IF NonAfrican American: 48 mL/min/{1.73_m2} — ABNORMAL LOW (ref >59)
GFR African American: 55 mL/min/{1.73_m2} — ABNORMAL LOW (ref >59)
Globulin, Total: 2.5 g/dL (ref 1.5–4.5)
Glucose: 91 mg/dL (ref 65–99)
Potassium: 4 mmol/L (ref 3.5–5.2)
Sodium: 141 mmol/L (ref 134–144)
Total Bilirubin: 0.7 mg/dL (ref 0.0–1.2)
Total Protein: 7.1 g/dL (ref 6.0–8.5)

## 2018-03-30 LAB — CBC WITH AUTO DIFFERENTIAL
Basophils %: 1 %
Basophils Absolute: 0.1 10*3/uL (ref 0.0–0.2)
Eosinophils %: 1 %
Eosinophils Absolute: 0.1 10*3/uL (ref 0.0–0.4)
Granulocyte Absolute Count: 0 10*3/uL (ref 0.0–0.1)
Hematocrit: 42.6 % (ref 34.0–46.6)
Hemoglobin: 14.7 g/dL (ref 11.1–15.9)
Immature Granulocytes: 0 %
Lymphocytes %: 20 %
Lymphocytes Absolute: 1.6 10*3/uL (ref 0.7–3.1)
MCH: 32.3 pg (ref 26.6–33.0)
MCHC: 34.5 g/dL (ref 31.5–35.7)
MCV: 94 fL (ref 79–97)
Monocytes %: 11 %
Monocytes Absolute: 0.9 10*3/uL (ref 0.1–0.9)
Neutrophils %: 67 %
Neutrophils Absolute: 5.5 10*3/uL (ref 1.4–7.0)
Platelets: 188 10*3/uL (ref 150–450)
RBC: 4.55 x10E6/uL (ref 3.77–5.28)
RDW: 12 % — ABNORMAL LOW (ref 12.3–15.4)
WBC: 8.1 10*3/uL (ref 3.4–10.8)

## 2018-03-30 LAB — TSH 3RD GENERATION
TSH: 3 u[IU]/mL (ref 0.450–4.500)
TSH: 3 u[IU]/mL (ref 0.450–4.500)

## 2018-03-30 LAB — LIPID PANEL WITH LDL/HDL RATIO
Cholesterol, Total: 222 mg/dL — ABNORMAL HIGH (ref 100–199)
Cholesterol, total: 222 mg/dL — ABNORMAL HIGH (ref 100–199)
HDL Cholesterol: 90 mg/dL (ref >39)
HDL: 90 mg/dL (ref >39)
LDL Calculated: 120 mg/dL — ABNORMAL HIGH (ref 0–99)
LDL, calculated: 120 mg/dL — ABNORMAL HIGH (ref 0–99)
LDL/HDL Ratio: 1.3 ratio (ref 0.0–3.2)
LDL/HDL Ratio: 1.3 ratio (ref 0.0–3.2)
Triglyceride: 62 mg/dL (ref 0–149)
Triglycerides: 62 mg/dL (ref 0–149)
VLDL Cholesterol Calculated: 12 mg/dL (ref 5–40)
VLDL, calculated: 12 mg/dL (ref 5–40)

## 2018-03-30 LAB — METABOLIC PANEL, COMPREHENSIVE
A-G Ratio: 1.8 (ref 1.2–2.2)
ALT (SGPT): 27 IU/L (ref 0–32)
AST (SGOT): 30 IU/L (ref 0–40)
Albumin: 4.6 g/dL (ref 3.5–4.7)
Alk. phosphatase: 60 IU/L (ref 39–117)
BUN/Creatinine ratio: 18 (ref 12–28)
BUN: 20 mg/dL (ref 8–27)
Bilirubin, total: 0.7 mg/dL (ref 0.0–1.2)
CO2: 24 mmol/L (ref 20–29)
Calcium: 9.8 mg/dL (ref 8.7–10.3)
Chloride: 103 mmol/L (ref 96–106)
Creatinine: 1.09 mg/dL — ABNORMAL HIGH (ref 0.57–1.00)
GFR est AA: 55 mL/min/{1.73_m2} — ABNORMAL LOW (ref >59)
GFR est non-AA: 48 mL/min/{1.73_m2} — ABNORMAL LOW (ref >59)
GLOBULIN, TOTAL: 2.5 g/dL (ref 1.5–4.5)
Glucose: 91 mg/dL (ref 65–99)
Potassium: 4 mmol/L (ref 3.5–5.2)
Protein, total: 7.1 g/dL (ref 6.0–8.5)
Sodium: 141 mmol/L (ref 134–144)

## 2018-03-30 LAB — CBC WITH AUTOMATED DIFF
ABS. BASOPHILS: 0.1 10*3/uL (ref 0.0–0.2)
ABS. EOSINOPHILS: 0.1 10*3/uL (ref 0.0–0.4)
ABS. IMM. GRANS.: 0 10*3/uL (ref 0.0–0.1)
ABS. MONOCYTES: 0.9 10*3/uL (ref 0.1–0.9)
ABS. NEUTROPHILS: 5.5 10*3/uL (ref 1.4–7.0)
Abs Lymphocytes: 1.6 10*3/uL (ref 0.7–3.1)
BASOPHILS: 1 %
EOSINOPHILS: 1 %
HCT: 42.6 % (ref 34.0–46.6)
HGB: 14.7 g/dL (ref 11.1–15.9)
IMMATURE GRANULOCYTES: 0 %
Lymphocytes: 20 %
MCH: 32.3 pg (ref 26.6–33.0)
MCHC: 34.5 g/dL (ref 31.5–35.7)
MCV: 94 fL (ref 79–97)
MONOCYTES: 11 %
NEUTROPHILS: 67 %
PLATELET: 188 10*3/uL (ref 150–450)
RBC: 4.55 x10E6/uL (ref 3.77–5.28)
RDW: 12 % — ABNORMAL LOW (ref 12.3–15.4)
WBC: 8.1 10*3/uL (ref 3.4–10.8)

## 2018-04-12 ENCOUNTER — Encounter

## 2018-04-13 ENCOUNTER — Telehealth

## 2018-04-13 MED ORDER — METOPROLOL SUCCINATE SR 100 MG 24 HR TAB
100 mg | ORAL_TABLET | Freq: Every day | ORAL | 1 refills | Status: DC
Start: 2018-04-13 — End: 2018-11-02

## 2018-04-13 NOTE — Telephone Encounter (Signed)
Toprol refill   [facility]

## 2018-04-13 NOTE — Telephone Encounter (Signed)
Rx for metoprolol sent to [facility].

## 2018-07-26 MED ORDER — CLOTRIMAZOLE-BETAMETHASONE 1 %-0.05 % TOPICAL CREAM
Freq: Two times a day (BID) | CUTANEOUS | 0 refills | Status: DC
Start: 2018-07-26 — End: 2018-08-13

## 2018-07-26 NOTE — Telephone Encounter (Signed)
Patient has rash on her back and requesting Clobetasol Propionate 0.05% refill. No appt's available today or Friday.

## 2018-07-26 NOTE — Telephone Encounter (Addendum)
Pt describes itching on her back, per Dr Cliffton AstersWhite sent in cream to pharmacy. Pt is aware and agreed.

## 2018-08-13 MED ORDER — CLOTRIMAZOLE-BETAMETHASONE 1 %-0.05 % TOPICAL CREAM
Freq: Two times a day (BID) | CUTANEOUS | 1 refills | Status: DC
Start: 2018-08-13 — End: 2018-12-12

## 2018-08-13 NOTE — Telephone Encounter (Signed)
Patient came by office today to request same RX for clotrimazole-betamethasone cream. States she now has same rash on her legs as she did on her back. Please [facility] to [facility] today please  (No appt.s available )pl

## 2018-08-13 NOTE — Telephone Encounter (Signed)
Rx sent to pharmacy

## 2018-09-28 ENCOUNTER — Encounter

## 2018-11-02 ENCOUNTER — Telehealth

## 2018-11-02 MED ORDER — METOPROLOL SUCCINATE SR 100 MG 24 HR TAB
100 mg | ORAL_TABLET | Freq: Every day | ORAL | 3 refills | Status: DC
Start: 2018-11-02 — End: 2019-08-21

## 2018-11-02 NOTE — Telephone Encounter (Signed)
Rx sent to pharmacy.

## 2018-11-02 NOTE — Telephone Encounter (Signed)
Pt needs refills on Metroprolol pt uses [facility] on [address] in , please call pt and advise.  Thanks LL

## 2018-12-06 ENCOUNTER — Encounter: Admit: 2018-12-06 | Discharge: 2018-12-06 | Payer: MEDICARE | Attending: Family Medicine | Primary: Family Medicine

## 2018-12-11 NOTE — Telephone Encounter (Signed)
Spouse called - pt has rash on her chest. Usually takes Lotrisone for outbreaks. Does she need Telmeds or can we send this in? Pt is 85 years old.

## 2018-12-12 MED ORDER — CLOTRIMAZOLE-BETAMETHASONE 1 %-0.05 % TOPICAL CREAM
Freq: Two times a day (BID) | CUTANEOUS | 1 refills | Status: AC
Start: 2018-12-12 — End: 2018-12-19

## 2018-12-12 NOTE — Telephone Encounter (Signed)
Patient's husband calling again to request refills topical cream for rash on patient's chest. Please advise him

## 2018-12-12 NOTE — Telephone Encounter (Signed)
Rx sent to pharmacy

## 2018-12-13 NOTE — Telephone Encounter (Signed)
Rx sent to [facility].

## 2019-02-04 DIAGNOSIS — Z9289 Personal history of other medical treatment: Secondary | ICD-10-CM

## 2019-02-04 HISTORY — DX: Personal history of other medical treatment: Z92.89

## 2019-05-02 ENCOUNTER — Ambulatory Visit: Admit: 2019-05-02 | Discharge: 2019-05-02 | Payer: MEDICARE | Attending: Family Medicine | Primary: Family Medicine

## 2019-05-02 ENCOUNTER — Ambulatory Visit: Attending: Family Medicine | Primary: Family Medicine

## 2019-05-02 DIAGNOSIS — M1990 Unspecified osteoarthritis, unspecified site: Secondary | ICD-10-CM

## 2019-05-02 LAB — AMB POC URINALYSIS DIP STICK AUTO W/O MICRO
Bilirubin (UA POC): NEGATIVE
Bilirubin, Urine, POC: NEGATIVE
Blood (UA POC): NEGATIVE
Blood (UA POC): NEGATIVE
Glucose (UA POC): NEGATIVE
Glucose, Urine, POC: NEGATIVE
Ketones (UA POC): NEGATIVE
Ketones, Urine, POC: NEGATIVE
Leukocyte Esterase, Urine, POC: NEGATIVE
Leukocyte esterase (UA POC): NEGATIVE
Nitrite, Urine, POC: NEGATIVE
Nitrites (UA POC): NEGATIVE
Specific Gravity, Urine, POC: 1.02 NA (ref 1.001–1.035)
Specific gravity (UA POC): 1.02 (ref 1.001–1.035)
Urobilinogen (UA POC): NORMAL (ref 0.2–1)
Urobilinogen, POC: NORMAL (ref 0.2–1)
pH (UA POC): 6 (ref 4.6–8.0)
pH, Urine, POC: 6 NA (ref 4.6–8.0)

## 2019-05-02 LAB — COMPREHENSIVE METABOLIC PANEL
Albumin: 4.4 (ref 3.5–5.0)
Calcium: 9.7 (ref 8.7–10.7)
GFR calc Af Amer: 62
GFR calc non Af Amer: 54
Globulin: 2.3

## 2019-05-02 LAB — BASIC METABOLIC PANEL
BUN: 25 — AB (ref 4–21)
CO2: 23 — AB (ref 13–22)
Chloride: 103 (ref 99–108)
Creatinine: 1 (ref 0.5–1.1)
Glucose: 106
Potassium: 4 (ref 3.4–5.3)
Sodium: 141 (ref 137–147)

## 2019-05-02 LAB — CBC AND DIFFERENTIAL
HCT: 43 (ref 36–46)
Hemoglobin: 14.7 (ref 12.0–16.0)
Neutrophils Absolute: 69
Platelets: 190 (ref 150–399)
WBC: 8.1

## 2019-05-02 LAB — HEPATIC FUNCTION PANEL
ALT: 17 (ref 7–35)
AST: 22 (ref 13–35)
Alkaline Phosphatase: 64 (ref 25–125)
Bilirubin, Total: 0.7

## 2019-05-02 LAB — LIPID PANEL
Cholesterol: 202 — AB (ref 0–200)
HDL: 76 — AB (ref 35–70)
LDL Cholesterol: 109
LDl/HDL Ratio: 1.4
Triglycerides: 83 (ref 40–160)

## 2019-05-02 LAB — CBC: RBC: 4.67 (ref 3.87–5.11)

## 2019-05-02 LAB — VITAMIN B12: Vitamin B-12: 568

## 2019-05-02 LAB — POCT ERYTHROCYTE SEDIMENTATION RATE, NON-AUTOMATED: Sed Rate: 2

## 2019-05-02 LAB — TSH: TSH: 2.69 (ref 0.41–5.90)

## 2019-05-02 LAB — VITAMIN D 25 HYDROXY (VIT D DEFICIENCY, FRACTURES): Vit D, 25-Hydroxy: 68.3

## 2019-05-02 NOTE — Progress Notes (Signed)
Normal letter mailed

## 2019-05-02 NOTE — Progress Notes (Signed)
HISTORY OF PRESENT ILLNESS  Melanie Houston is a 85 y.o. female.  Thyroid Problem   The history is provided by the patient. This is a recurrent problem. The problem has not changed since onset.Pertinent negatives include no chest pain, no abdominal pain, no headaches and no shortness of breath. Nothing (denies change in appetite etc ) aggravates the symptoms. The symptoms are relieved by medications.   Weight Loss   The history is provided by the patient (denies change in appetite etc ). This is a new problem. Pertinent negatives include no chest pain, no abdominal pain, no headaches and no shortness of breath.   Memory Loss    The history is provided by the patient (husband reports stable and non progressive , kids are more worried and she reports this makes her more anxious and feels this makes it worse ). This is a recurrent problem. The problem has not changed since onset.  Eye Problem   The history is provided by the patient (glaucoma). This is a recurrent problem. The problem has not changed since onset.Pertinent negatives include no chest pain, no abdominal pain, no headaches and no shortness of breath.     Allergies   Allergen Reactions   ??? Nsaids (Non-Steroidal Anti-Inflammatory Drug) Other (comments)   ??? Azithromycin Unknown (comments)   ??? Celebrex [Celecoxib] Unknown (comments)   ??? Ibuprofen Unknown (comments)   ??? Penicillins Unknown (comments)   ??? Sulfa (Sulfonamide Antibiotics) Unknown (comments)   ??? Tetracyclines Unknown (comments)     Current Outpatient Medications on File Prior to Visit   Medication Sig Dispense Refill   ??? metoprolol succinate (TOPROL-XL) 100 mg tablet Take 1 Tab by mouth daily. 90 Tab 3   ??? cholecalciferol (VITAMIN D3) 1,000 unit cap Take  by mouth daily (with dinner).     ??? OTHER,NON-FORMULARY, Unknown eye drop for glaucoma     ??? dorzolamide-timolol (COSOPT) 22.3-6.8 mg/mL ophthalmic solution INSTILL 1 DROP INTO BOTH EYES TWICE A DAY  4   ??? latanoprost (XALATAN) 0.005 %  ophthalmic solution INSTILL 1 DROP INTO BOTH EYES AT BEDTIME  11   ??? cholecalciferol, vitamin D3, 2,000 unit tab Take  by mouth.     ??? ascorbic acid (VITAMIN C) 500 mg tablet Strength: ; Form: ; SIG: take 1 tablet orally Daily       No current facility-administered medications on file prior to visit.      Past Medical History:   Diagnosis Date   ??? Arthritis     osteo-arthritis   ??? Cancer ([place])     Bilateral Breast Cancer   ??? Chronic kidney disease     followed by Dr. Stephens Shire   ??? Chronic UTI     Followed by Dr. Venia Minks, chronic macrobid   ??? Colon polyps    ??? Glaucoma    ??? Hematuria     followed by Dr. Irean Hong   ??? Hypertension    ??? Osteoporosis     followed by Dr. Debbra Riding   ??? Thyroid disease     low TSH followed by Dr. Janese Banks   ??? Vitamin D deficiency      Past Surgical History:   Procedure Laterality Date   ??? HX COLONOSCOPY  2006   ??? HX GYN      hysterectomy   ??? HX HEENT      tonsillectomy   ??? HX MASTECTOMY Bilateral 1988 and 2002   ??? PR COLONOSCOPY W/BIOPSY SINGLE/MULTIPLE  05/17/2017    polyp  Social History     Socioeconomic History   ??? Marital status: MARRIED     Spouse name: Not on file   ??? Number of children: Not on file   ??? Years of education: Not on file   ??? Highest education level: Not on file   Occupational History   ??? Not on file   Social Needs   ??? Financial resource strain: Not on file   ??? Food insecurity     Worry: Not on file     Inability: Not on file   ??? Transportation needs     Medical: Not on file     Non-medical: Not on file   Tobacco Use   ??? Smoking status: Never Smoker   ??? Smokeless tobacco: Never Used   Substance and Sexual Activity   ??? Alcohol use: No     Alcohol/week: 0.0 standard drinks   ??? Drug use: No   ??? Sexual activity: Not on file   Lifestyle   ??? Physical activity     Days per week: Not on file     Minutes per session: Not on file   ??? Stress: Not on file   Relationships   ??? Social Product manager on phone: Not on file     Gets together: Not on file     Attends religious service:  Not on file     Active member of club or organization: Not on file     Attends meetings of clubs or organizations: Not on file     Relationship status: Not on file   ??? Intimate partner violence     Fear of current or ex partner: Not on file     Emotionally abused: Not on file     Physically abused: Not on file     Forced sexual activity: Not on file   Other Topics Concern   ??? Not on file   Social History Narrative   ??? Not on file       Review of Systems   Constitutional: Negative for chills, fever and malaise/fatigue.   HENT: Negative for congestion and hearing loss.    Eyes: Negative for blurred vision, pain and discharge.   Respiratory: Negative for cough, hemoptysis, shortness of breath and wheezing.    Cardiovascular: Negative for chest pain, palpitations and leg swelling.   Gastrointestinal: Negative for abdominal pain, blood in stool, constipation, diarrhea, heartburn and nausea.   Genitourinary: Negative for dysuria, frequency, hematuria and urgency.   Musculoskeletal: Negative for joint pain and myalgias.   Skin: Negative for rash.   Neurological: Negative for dizziness, sensory change and headaches.   Endo/Heme/Allergies: Negative for polydipsia. Does not bruise/bleed easily.   Psychiatric/Behavioral: Positive for memory loss. Negative for depression. The patient is not nervous/anxious and does not have insomnia.      Blood pressure 116/64, pulse 70, temperature 97.2 ??F (36.2 ??C), temperature source Temporal, height 5' 3.25" (1.607 m), weight 87 lb 3.2 oz (39.6 kg), SpO2 96 %.      Physical Exam  Vitals signs and nursing note reviewed.   Constitutional:       General: She is not in acute distress.     Appearance: She is well-developed.   HENT:      Head: Atraumatic.      Right Ear: External ear normal.      Left Ear: External ear normal.      Nose: Nose normal.  Mouth/Throat:      Pharynx: No oropharyngeal exudate.   Eyes:      General: No scleral icterus.        Right eye: No discharge.         Left  eye: No discharge.      Conjunctiva/sclera: Conjunctivae normal.      Pupils: Pupils are equal, round, and reactive to light.   Neck:      Musculoskeletal: Normal range of motion.      Thyroid: No thyromegaly.   Cardiovascular:      Rate and Rhythm: Normal rate and regular rhythm.      Heart sounds: Normal heart sounds. No murmur. No friction rub. No gallop.    Pulmonary:      Effort: Pulmonary effort is normal. No respiratory distress.      Breath sounds: Normal breath sounds. No wheezing or rales.   Chest:      Chest wall: No tenderness.   Abdominal:      General: There is no distension.      Palpations: There is no mass.      Tenderness: There is no abdominal tenderness. There is no guarding or rebound.   Musculoskeletal: Normal range of motion.         General: No tenderness.   Lymphadenopathy:      Cervical: No cervical adenopathy.   Skin:     General: Skin is warm and dry.   Neurological:      Mental Status: She is alert and oriented to person, place, and time.      Coordination: Coordination normal.       Office Visit on 05/02/2019   Component Date Value Ref Range Status   ??? WBC 05/02/2019 8.1  3.4 - 10.8 x10E3/uL Final   ??? RBC 05/02/2019 4.67  3.77 - 5.28 x10E6/uL Final   ??? HGB 05/02/2019 14.7  11.1 - 15.9 g/dL Final   ??? HCT 05/02/2019 43.4  34.0 - 46.6 % Final   ??? MCV 05/02/2019 93  79 - 97 fL Final   ??? MCH 05/02/2019 31.5  26.6 - 33.0 pg Final   ??? MCHC 05/02/2019 33.9  31.5 - 35.7 g/dL Final   ??? RDW 05/02/2019 12.3  11.7 - 15.4 % Final   ??? PLATELET 05/02/2019 190  150 - 450 x10E3/uL Final   ??? NEUTROPHILS 05/02/2019 69  Not Estab. % Final   ??? Lymphocytes 05/02/2019 18  Not Estab. % Final   ??? MONOCYTES 05/02/2019 11  Not Estab. % Final   ??? EOSINOPHILS 05/02/2019 1  Not Estab. % Final   ??? BASOPHILS 05/02/2019 1  Not Estab. % Final   ??? ABS. NEUTROPHILS 05/02/2019 5.6  1.4 - 7.0 x10E3/uL Final   ??? Abs Lymphocytes 05/02/2019 1.4  0.7 - 3.1 x10E3/uL Final   ??? ABS. MONOCYTES 05/02/2019 0.9  0.1 - 0.9 x10E3/uL  Final   ??? ABS. EOSINOPHILS 05/02/2019 0.1  0.0 - 0.4 x10E3/uL Final   ??? ABS. BASOPHILS 05/02/2019 0.1  0.0 - 0.2 x10E3/uL Final   ??? IMMATURE GRANULOCYTES 05/02/2019 0  Not Estab. % Final   ??? ABS. IMM. GRANS. 05/02/2019 0.0  0.0 - 0.1 x10E3/uL Final   ??? Glucose 05/02/2019 106* 65 - 99 mg/dL Final   ??? BUN 05/02/2019 25  8 - 27 mg/dL Final   ??? Creatinine 05/02/2019 0.98  0.57 - 1.00 mg/dL Final   ??? GFR est non-AA 05/02/2019 54* >59 mL/min/1.73 Final   ??? GFR est  AA 05/02/2019 62  >59 mL/min/1.73 Final   ??? BUN/Creatinine ratio 05/02/2019 26  12 - 28 Final   ??? Sodium 05/02/2019 141  134 - 144 mmol/L Final   ??? Potassium 05/02/2019 4.0  3.5 - 5.2 mmol/L Final   ??? Chloride 05/02/2019 103  96 - 106 mmol/L Final   ??? CO2 05/02/2019 23  20 - 29 mmol/L Final   ??? Calcium 05/02/2019 9.7  8.7 - 10.3 mg/dL Final   ??? Protein, total 05/02/2019 6.7  6.0 - 8.5 g/dL Final   ??? Albumin 05/02/2019 4.4  3.6 - 4.6 g/dL Final   ??? GLOBULIN, TOTAL 05/02/2019 2.3  1.5 - 4.5 g/dL Final   ??? A-G Ratio 05/02/2019 1.9  1.2 - 2.2 Final   ??? Bilirubin, total 05/02/2019 0.7  0.0 - 1.2 mg/dL Final   ??? Alk. phosphatase 05/02/2019 64  39 - 117 IU/L Final   ??? AST (SGOT) 05/02/2019 22  0 - 40 IU/L Final   ??? ALT (SGPT) 05/02/2019 17  0 - 32 IU/L Final   ??? Cholesterol, total 05/02/2019 202* 100 - 199 mg/dL Final   ??? Triglyceride 05/02/2019 83  0 - 149 mg/dL Final   ??? HDL Cholesterol 05/02/2019 76  >39 mg/dL Final   ??? VLDL, calculated 05/02/2019 17  5 - 40 mg/dL Final   ??? LDL, calculated 05/02/2019 109* 0 - 99 mg/dL Final    Comment: **Effective May 06, 2019, [facility] is implementing an improved**  equation to calculate Low Density Lipoprotein Cholesterol (LDL-C)  concentrations, to be used in all lipid panels that report calculated  LDL-C. This equation was developed through a collaboration with the  [employer], [facility] and [place] (NIH).[1] The NIH  calculation overcomes the limitations of the existing [place]  LDL-C equation and  performs equally well in both fasting and  non-fasting individuals.  1. Pauline Good Q, et al. A new equation for  calculation of low-density lipoprotein cholesterol in patients with  normolipidemia and/or hypertriglyceridemia. JAMA Cardiol.  2020 Feb 26. doi:[email or web address]     ??? LDL/HDL Ratio 05/02/2019 1.4  0.0 - 3.2 ratio Final    Comment:                                     LDL/HDL Ratio                                              Men  Women                                1/2 Avg.Risk  1.0    1.5                                    Avg.Risk  3.6    3.2                                 2X Avg.Risk  6.2    5.0  3X Avg.Risk  8.0    6.1     ??? TSH 05/02/2019 2.690  0.450 - 4.500 uIU/mL Final   ??? Color (UA POC) 05/02/2019 Yellow   Final   ??? Clarity (UA POC) 05/02/2019 Clear   Final   ??? Glucose (UA POC) 05/02/2019 Negative  Negative Final   ??? Bilirubin (UA POC) 05/02/2019 Negative  Negative Final   ??? Ketones (UA POC) 05/02/2019 Negative  Negative Final   ??? Specific gravity (UA POC) 05/02/2019 1.020  1.001 - 1.035 Final   ??? Blood (UA POC) 05/02/2019 Negative  Negative Final   ??? pH (UA POC) 05/02/2019 6.0  4.6 - 8.0 Final   ??? Protein (UA POC) 05/02/2019 Trace  Negative Final   ??? Urobilinogen (UA POC) 05/02/2019 normal  0.2 - 1 Final   ??? Nitrites (UA POC) 05/02/2019 Negative  Negative Final   ??? Leukocyte esterase (UA POC) 05/02/2019 Negative  Negative Final   ??? Vitamin B12 05/02/2019 568  232 - 1,245 pg/mL Final   ??? VITAMIN D, 25-HYDROXY 05/02/2019 68.3  30.0 - 100.0 ng/mL Final    Comment: Vitamin D deficiency has been defined by the Institute of  Medicine and an Endocrine Society practice guideline as a  level of serum 25-OH vitamin D less than 20 ng/mL (1,2).  The Endocrine Society went on to further define vitamin D  insufficiency as a level between 21 and 29 ng/mL (2).  1. IOM (Institute of Medicine). 2010. Dietary reference     intakes for calcium and D.  [place]: The     [employer].  2. Holick MF, Binkley NC, Bischoff-Ferrari HA, et al.     Evaluation, treatment, and prevention of vitamin D     deficiency: an Endocrine Society clinical practice     guideline. JCEM. 2011 Jul; 96(7):1911-30.     ??? RPR 05/02/2019 Non Reactive  Non Reactive Final   ??? Antinuclear Abs, IFA 05/02/2019 Negative   Final    Comment:                                      Negative   <1:80                                       Borderline  1:80                                       Positive   >1:80     ??? Sed rate (ESR) 05/02/2019 2  0 - 40 mm/hr Final   ??? Uric acid 05/02/2019 3.8  2.5 - 7.1 mg/dL Final               Therapeutic target for gout patients: <6.0   ??? Anti-streptolysin O Ab 05/02/2019 64.0  0.0 - 200.0 IU/mL Final   ??? C-Reactive Protein, Qt 05/02/2019 <1  0 - 10 mg/L Final   ??? Rheumatoid factor 05/02/2019 <10.0  0.0 - 13.9 IU/mL Final         ASSESSMENT and PLAN    ICD-10-CM ICD-9-CM    1. Arthritis  M19.90 716.90 CBC WITH AUTOMATED DIFF      METABOLIC PANEL, COMPREHENSIVE      LIPID  PANEL WITH LDL/HDL RATIO      TSH 3RD GENERATION      AMB POC URINALYSIS DIP STICK AUTO W/O MICRO      VITAMIN B12      VITAMIN D, 25 HYDROXY      RPR      ANTINUCLEAR ANTIBODIES, IFA      SED RATE (ESR)      URIC ACID      STREPTOLYSIN O (ASO) AB      C REACTIVE PROTEIN, QT      RHEUMATOID FACTOR, QL   2. Weight loss  R63.4 783.21 CBC WITH AUTOMATED DIFF      METABOLIC PANEL, COMPREHENSIVE      LIPID PANEL WITH LDL/HDL RATIO      TSH 3RD GENERATION      AMB POC URINALYSIS DIP STICK AUTO W/O MICRO      VITAMIN B12      VITAMIN D, 25 HYDROXY      RPR      ANTINUCLEAR ANTIBODIES, IFA      SED RATE (ESR)      URIC ACID      STREPTOLYSIN O (ASO) AB      C REACTIVE PROTEIN, QT      RHEUMATOID FACTOR, QL   3. Anemia, unspecified type  D64.9 285.9 CBC WITH AUTOMATED DIFF      METABOLIC PANEL, COMPREHENSIVE      LIPID PANEL WITH LDL/HDL RATIO      TSH 3RD GENERATION      AMB POC URINALYSIS DIP  STICK AUTO W/O MICRO      VITAMIN B12      VITAMIN D, 25 HYDROXY      RPR      ANTINUCLEAR ANTIBODIES, IFA      SED RATE (ESR)      URIC ACID      STREPTOLYSIN O (ASO) AB      C REACTIVE PROTEIN, QT      RHEUMATOID FACTOR, QL   4. Vitamin D deficiency  E55.9 268.9 CBC WITH AUTOMATED DIFF      METABOLIC PANEL, COMPREHENSIVE      LIPID PANEL WITH LDL/HDL RATIO      TSH 3RD GENERATION      AMB POC URINALYSIS DIP STICK AUTO W/O MICRO      VITAMIN B12      VITAMIN D, 25 HYDROXY      RPR      ANTINUCLEAR ANTIBODIES, IFA      SED RATE (ESR)      URIC ACID      STREPTOLYSIN O (ASO) AB      C REACTIVE PROTEIN, QT      RHEUMATOID FACTOR, QL   5. Osteoporosis, unspecified osteoporosis type, unspecified pathological fracture presence  M81.0 733.00 CBC WITH AUTOMATED DIFF      METABOLIC PANEL, COMPREHENSIVE      LIPID PANEL WITH LDL/HDL RATIO      TSH 3RD GENERATION      AMB POC URINALYSIS DIP STICK AUTO W/O MICRO      VITAMIN B12      VITAMIN D, 25 HYDROXY      RPR      ANTINUCLEAR ANTIBODIES, IFA      SED RATE (ESR)      URIC ACID      STREPTOLYSIN O (ASO) AB      C REACTIVE PROTEIN, QT      RHEUMATOID FACTOR, QL   6. Essential hypertension  I10  401.9 CBC WITH AUTOMATED DIFF      METABOLIC PANEL, COMPREHENSIVE      LIPID PANEL WITH LDL/HDL RATIO      TSH 3RD GENERATION      AMB POC URINALYSIS DIP STICK AUTO W/O MICRO      VITAMIN B12      VITAMIN D, 25 HYDROXY      RPR      ANTINUCLEAR ANTIBODIES, IFA      SED RATE (ESR)      URIC ACID      STREPTOLYSIN O (ASO) AB      C REACTIVE PROTEIN, QT      RHEUMATOID FACTOR, QL   7. Thyroid disease  E07.9 246.9 CBC WITH AUTOMATED DIFF      METABOLIC PANEL, COMPREHENSIVE      LIPID PANEL WITH LDL/HDL RATIO      TSH 3RD GENERATION      AMB POC URINALYSIS DIP STICK AUTO W/O MICRO      VITAMIN B12      VITAMIN D, 25 HYDROXY      RPR      ANTINUCLEAR ANTIBODIES, IFA      SED RATE (ESR)      URIC ACID      STREPTOLYSIN O (ASO) AB      C REACTIVE PROTEIN, QT      RHEUMATOID FACTOR, QL   8.  Dementia without behavioral disturbance, unspecified dementia type (HCC)  F03.90 294.20 CBC WITH AUTOMATED DIFF      METABOLIC PANEL, COMPREHENSIVE      LIPID PANEL WITH LDL/HDL RATIO      TSH 3RD GENERATION      AMB POC URINALYSIS DIP STICK AUTO W/O MICRO      VITAMIN B12      VITAMIN D, 25 HYDROXY      RPR      ANTINUCLEAR ANTIBODIES, IFA      SED RATE (ESR)      URIC ACID      STREPTOLYSIN O (ASO) AB      C REACTIVE PROTEIN, QT      RHEUMATOID FACTOR, QL   9. Medicare annual wellness visit, subsequent  Z00.00 V70.0    10. Screening for depression  Z13.31 V79.0 DEPRESSION SCREEN ANNUAL     Diagnoses and all orders for this visit:    1. Arthritis  -     CBC WITH AUTOMATED DIFF  -     METABOLIC PANEL, COMPREHENSIVE  -     LIPID PANEL WITH LDL/HDL RATIO  -     TSH 3RD GENERATION  -     AMB POC URINALYSIS DIP STICK AUTO W/O MICRO  -     VITAMIN B12  -     VITAMIN D, 25 HYDROXY  -     RPR  -     ANTINUCLEAR ANTIBODIES, IFA  -     SED RATE (ESR)  -     URIC ACID  -     STREPTOLYSIN O (ASO) AB  -     C REACTIVE PROTEIN, QT  -     RHEUMATOID FACTOR, QL    2. Weight loss  -     CBC WITH AUTOMATED DIFF  -     METABOLIC PANEL, COMPREHENSIVE  -     LIPID PANEL WITH LDL/HDL RATIO  -     TSH 3RD GENERATION  -     AMB POC URINALYSIS DIP STICK AUTO W/O MICRO  -  VITAMIN B12  -     VITAMIN D, 25 HYDROXY  -     RPR  -     ANTINUCLEAR ANTIBODIES, IFA  -     SED RATE (ESR)  -     URIC ACID  -     STREPTOLYSIN O (ASO) AB  -     C REACTIVE PROTEIN, QT  -     RHEUMATOID FACTOR, QL    3. Anemia, unspecified type  -     CBC WITH AUTOMATED DIFF  -     METABOLIC PANEL, COMPREHENSIVE  -     LIPID PANEL WITH LDL/HDL RATIO  -     TSH 3RD GENERATION  -     AMB POC URINALYSIS DIP STICK AUTO W/O MICRO  -     VITAMIN B12  -     VITAMIN D, 25 HYDROXY  -     RPR  -     ANTINUCLEAR ANTIBODIES, IFA  -     SED RATE (ESR)  -     URIC ACID  -     STREPTOLYSIN O (ASO) AB  -     C REACTIVE PROTEIN, QT  -     RHEUMATOID FACTOR, QL    4. Vitamin D  deficiency  -     CBC WITH AUTOMATED DIFF  -     METABOLIC PANEL, COMPREHENSIVE  -     LIPID PANEL WITH LDL/HDL RATIO  -     TSH 3RD GENERATION  -     AMB POC URINALYSIS DIP STICK AUTO W/O MICRO  -     VITAMIN B12  -     VITAMIN D, 25 HYDROXY  -     RPR  -     ANTINUCLEAR ANTIBODIES, IFA  -     SED RATE (ESR)  -     URIC ACID  -     STREPTOLYSIN O (ASO) AB  -     C REACTIVE PROTEIN, QT  -     RHEUMATOID FACTOR, QL    5. Osteoporosis, unspecified osteoporosis type, unspecified pathological fracture presence  -     CBC WITH AUTOMATED DIFF  -     METABOLIC PANEL, COMPREHENSIVE  -     LIPID PANEL WITH LDL/HDL RATIO  -     TSH 3RD GENERATION  -     AMB POC URINALYSIS DIP STICK AUTO W/O MICRO  -     VITAMIN B12  -     VITAMIN D, 25 HYDROXY  -     RPR  -     ANTINUCLEAR ANTIBODIES, IFA  -     SED RATE (ESR)  -     URIC ACID  -     STREPTOLYSIN O (ASO) AB  -     C REACTIVE PROTEIN, QT  -     RHEUMATOID FACTOR, QL    6. Essential hypertension  -     CBC WITH AUTOMATED DIFF  -     METABOLIC PANEL, COMPREHENSIVE  -     LIPID PANEL WITH LDL/HDL RATIO  -     TSH 3RD GENERATION  -     AMB POC URINALYSIS DIP STICK AUTO W/O MICRO  -     VITAMIN B12  -     VITAMIN D, 25 HYDROXY  -     RPR  -     ANTINUCLEAR ANTIBODIES, IFA  -     SED  RATE (ESR)  -     URIC ACID  -     STREPTOLYSIN O (ASO) AB  -     C REACTIVE PROTEIN, QT  -     RHEUMATOID FACTOR, QL    7. Thyroid disease  -     CBC WITH AUTOMATED DIFF  -     METABOLIC PANEL, COMPREHENSIVE  -     LIPID PANEL WITH LDL/HDL RATIO  -     TSH 3RD GENERATION  -     AMB POC URINALYSIS DIP STICK AUTO W/O MICRO  -     VITAMIN B12  -     VITAMIN D, 25 HYDROXY  -     RPR  -     ANTINUCLEAR ANTIBODIES, IFA  -     SED RATE (ESR)  -     URIC ACID  -     STREPTOLYSIN O (ASO) AB  -     C REACTIVE PROTEIN, QT  -     RHEUMATOID FACTOR, QL    8. Dementia without behavioral disturbance, unspecified dementia type (HCC)  -     CBC WITH AUTOMATED DIFF  -     METABOLIC PANEL, COMPREHENSIVE  -     LIPID  PANEL WITH LDL/HDL RATIO  -     TSH 3RD GENERATION  -     AMB POC URINALYSIS DIP STICK AUTO W/O MICRO  -     VITAMIN B12  -     VITAMIN D, 25 HYDROXY  -     RPR  -     ANTINUCLEAR ANTIBODIES, IFA  -     SED RATE (ESR)  -     URIC ACID  -     STREPTOLYSIN O (ASO) AB  -     C REACTIVE PROTEIN, QT  -     RHEUMATOID FACTOR, QL    9. Medicare annual wellness visit, subsequent    10. Screening for depression  -     DEPRESSION SCREEN ANNUAL      current treatment plan is effective, no change in therapy

## 2019-05-02 NOTE — Progress Notes (Signed)
This is the Subsequent Medicare Annual Wellness Exam, performed 12 months or more after the Initial AWV or the last Subsequent AWV    I have reviewed the patient's medical history in detail and updated the computerized patient record.     History     Patient Active Problem List   Diagnosis Code   ??? Thyroid disease E07.9   ??? Hypertension I10   ??? Chronic kidney disease N18.9   ??? Cancer (HCC) C80.1   ??? Osteoporosis M81.0   ??? Arthritis M19.90   ??? Vitamin D deficiency E55.9   ??? Glaucoma H40.9     Past Medical History:   Diagnosis Date   ??? Arthritis     osteo-arthritis   ??? Cancer ([place])     Bilateral Breast Cancer   ??? Chronic kidney disease     followed by Dr. Stephens Shire   ??? Chronic UTI     Followed by Dr. Venia Minks, chronic macrobid   ??? Colon polyps    ??? Glaucoma    ??? Hematuria     followed by Dr. Irean Hong   ??? Hypertension    ??? Osteoporosis     followed by Dr. Debbra Riding   ??? Thyroid disease     low TSH followed by Dr. Janese Banks   ??? Vitamin D deficiency       Past Surgical History:   Procedure Laterality Date   ??? HX COLONOSCOPY  2006   ??? HX GYN      hysterectomy   ??? HX HEENT      tonsillectomy   ??? HX MASTECTOMY Bilateral 1988 and 2002   ??? PR COLONOSCOPY W/BIOPSY SINGLE/MULTIPLE  05/17/2017    polyp     Current Outpatient Medications   Medication Sig Dispense Refill   ??? metoprolol succinate (TOPROL-XL) 100 mg tablet Take 1 Tab by mouth daily. 90 Tab 3   ??? cholecalciferol (VITAMIN D3) 1,000 unit cap Take  by mouth daily (with dinner).     ??? OTHER,NON-FORMULARY, Unknown eye drop for glaucoma     ??? dorzolamide-timolol (COSOPT) 22.3-6.8 mg/mL ophthalmic solution INSTILL 1 DROP INTO BOTH EYES TWICE A DAY  4   ??? latanoprost (XALATAN) 0.005 % ophthalmic solution INSTILL 1 DROP INTO BOTH EYES AT BEDTIME  11   ??? cholecalciferol, vitamin D3, 2,000 unit tab Take  by mouth.     ??? ascorbic acid (VITAMIN C) 500 mg tablet Strength: ; Form: ; SIG: take 1 tablet orally Daily       Allergies   Allergen Reactions   ??? Nsaids (Non-Steroidal  Anti-Inflammatory Drug) Other (comments)   ??? Azithromycin Unknown (comments)   ??? Celebrex [Celecoxib] Unknown (comments)   ??? Ibuprofen Unknown (comments)   ??? Penicillins Unknown (comments)   ??? Sulfa (Sulfonamide Antibiotics) Unknown (comments)   ??? Tetracyclines Unknown (comments)       Family History   Problem Relation Age of Onset   ??? Cancer Mother    ??? Heart Disease Father      Social History     Tobacco Use   ??? Smoking status: Never Smoker   ??? Smokeless tobacco: Never Used   Substance Use Topics   ??? Alcohol use: No     Alcohol/week: 0.0 standard drinks       Depression Risk Factor Screening:     3 most recent PHQ Screens 05/02/2019   PHQ Not Done -   Little interest or pleasure in doing things Not at all   Feeling down, depressed,  irritable, or hopeless Not at all   Total Score PHQ 2 0       Alcohol Risk Screen   Do you average more than 1 drink per night or more than 7 drinks a week:  No    On any one occasion in the past three months have you have had more than 3 drinks containing alcohol:  No        Functional Ability and Level of Safety:   Hearing: Hearing is good.     Activities of Daily Living:  The home contains: no safety equipment.  Patient does total self care     Ambulation: with no difficulty     Fall Risk:  Fall Risk Assessment, last 12 mths 05/02/2019   Able to walk? Yes   Fall in past 12 months? No     Abuse Screen:  Patient is not abused       Cognitive Screening   Has your family/caregiver stated any concerns about your memory: yes - patients kids worry about memory but husband feels no decline      Cognitive Screening: Abnormal - Clock Drawing Test    Patient Care Team   Patient Care Team:  Bernadette Hoit, MD as PCP - General (Family Medicine)  Bernadette Hoit, MD as PCP - [facility] Empaneled Provider    Assessment/Plan   Education and counseling provided:  Are appropriate based on today's review and evaluation    Diagnoses and all orders for this visit:    1. Arthritis  -     CBC WITH AUTOMATED DIFF  -      METABOLIC PANEL, COMPREHENSIVE  -     LIPID PANEL WITH LDL/HDL RATIO  -     TSH 3RD GENERATION  -     AMB POC URINALYSIS DIP STICK AUTO W/O MICRO  -     VITAMIN B12  -     VITAMIN D, 25 HYDROXY  -     RPR  -     ANTINUCLEAR ANTIBODIES, IFA  -     SED RATE (ESR)  -     URIC ACID  -     STREPTOLYSIN O (ASO) AB  -     C REACTIVE PROTEIN, QT  -     RHEUMATOID FACTOR, QL    2. Weight loss  -     CBC WITH AUTOMATED DIFF  -     METABOLIC PANEL, COMPREHENSIVE  -     LIPID PANEL WITH LDL/HDL RATIO  -     TSH 3RD GENERATION  -     AMB POC URINALYSIS DIP STICK AUTO W/O MICRO  -     VITAMIN B12  -     VITAMIN D, 25 HYDROXY  -     RPR  -     ANTINUCLEAR ANTIBODIES, IFA  -     SED RATE (ESR)  -     URIC ACID  -     STREPTOLYSIN O (ASO) AB  -     C REACTIVE PROTEIN, QT  -     RHEUMATOID FACTOR, QL    3. Anemia, unspecified type  -     CBC WITH AUTOMATED DIFF  -     METABOLIC PANEL, COMPREHENSIVE  -     LIPID PANEL WITH LDL/HDL RATIO  -     TSH 3RD GENERATION  -     AMB POC URINALYSIS DIP STICK AUTO W/O MICRO  -  VITAMIN B12  -     VITAMIN D, 25 HYDROXY  -     RPR  -     ANTINUCLEAR ANTIBODIES, IFA  -     SED RATE (ESR)  -     URIC ACID  -     STREPTOLYSIN O (ASO) AB  -     C REACTIVE PROTEIN, QT  -     RHEUMATOID FACTOR, QL    4. Vitamin D deficiency  -     CBC WITH AUTOMATED DIFF  -     METABOLIC PANEL, COMPREHENSIVE  -     LIPID PANEL WITH LDL/HDL RATIO  -     TSH 3RD GENERATION  -     AMB POC URINALYSIS DIP STICK AUTO W/O MICRO  -     VITAMIN B12  -     VITAMIN D, 25 HYDROXY  -     RPR  -     ANTINUCLEAR ANTIBODIES, IFA  -     SED RATE (ESR)  -     URIC ACID  -     STREPTOLYSIN O (ASO) AB  -     C REACTIVE PROTEIN, QT  -     RHEUMATOID FACTOR, QL    5. Osteoporosis, unspecified osteoporosis type, unspecified pathological fracture presence  -     CBC WITH AUTOMATED DIFF  -     METABOLIC PANEL, COMPREHENSIVE  -     LIPID PANEL WITH LDL/HDL RATIO  -     TSH 3RD GENERATION  -     AMB POC URINALYSIS DIP STICK AUTO W/O MICRO  -      VITAMIN B12  -     VITAMIN D, 25 HYDROXY  -     RPR  -     ANTINUCLEAR ANTIBODIES, IFA  -     SED RATE (ESR)  -     URIC ACID  -     STREPTOLYSIN O (ASO) AB  -     C REACTIVE PROTEIN, QT  -     RHEUMATOID FACTOR, QL    6. Essential hypertension  -     CBC WITH AUTOMATED DIFF  -     METABOLIC PANEL, COMPREHENSIVE  -     LIPID PANEL WITH LDL/HDL RATIO  -     TSH 3RD GENERATION  -     AMB POC URINALYSIS DIP STICK AUTO W/O MICRO  -     VITAMIN B12  -     VITAMIN D, 25 HYDROXY  -     RPR  -     ANTINUCLEAR ANTIBODIES, IFA  -     SED RATE (ESR)  -     URIC ACID  -     STREPTOLYSIN O (ASO) AB  -     C REACTIVE PROTEIN, QT  -     RHEUMATOID FACTOR, QL    7. Thyroid disease  -     CBC WITH AUTOMATED DIFF  -     METABOLIC PANEL, COMPREHENSIVE  -     LIPID PANEL WITH LDL/HDL RATIO  -     TSH 3RD GENERATION  -     AMB POC URINALYSIS DIP STICK AUTO W/O MICRO  -     VITAMIN B12  -     VITAMIN D, 25 HYDROXY  -     RPR  -     ANTINUCLEAR ANTIBODIES, IFA  -     SED RATE (  ESR)  -     URIC ACID  -     STREPTOLYSIN O (ASO) AB  -     C REACTIVE PROTEIN, QT  -     RHEUMATOID FACTOR, QL    8. Dementia without behavioral disturbance, unspecified dementia type (HCC)  -     CBC WITH AUTOMATED DIFF  -     METABOLIC PANEL, COMPREHENSIVE  -     LIPID PANEL WITH LDL/HDL RATIO  -     TSH 3RD GENERATION  -     AMB POC URINALYSIS DIP STICK AUTO W/O MICRO  -     VITAMIN B12  -     VITAMIN D, 25 HYDROXY  -     RPR  -     ANTINUCLEAR ANTIBODIES, IFA  -     SED RATE (ESR)  -     URIC ACID  -     STREPTOLYSIN O (ASO) AB  -     C REACTIVE PROTEIN, QT  -     RHEUMATOID FACTOR, QL        Health Maintenance Due   Topic Date Due   ??? DTaP/Tdap/Td series (1 - Tdap) 05/13/1957   ??? Shingrix Vaccine Age 20> (1 of 2) 05/13/1986   ??? Medicare Yearly Exam  03/30/2019   ??? Flu Vaccine (1) 05/07/2019

## 2019-05-02 NOTE — Progress Notes (Signed)
This is the Subsequent Medicare Annual Wellness Exam, performed 12 months or more after the Initial AWV or the last Subsequent AWV    I have reviewed the patient's medical history in detail and updated the computerized patient record.     History     Patient Active Problem List   Diagnosis Code   ??? Thyroid disease E07.9   ??? Hypertension I10   ??? Chronic kidney disease N18.9   ??? Cancer (HCC) C80.1   ??? Osteoporosis M81.0   ??? Arthritis M19.90   ??? Vitamin D deficiency E55.9   ??? Glaucoma H40.9     Past Medical History:   Diagnosis Date   ??? Arthritis     osteo-arthritis   ??? Cancer ([place])     Bilateral Breast Cancer   ??? Chronic kidney disease     followed by Dr. Stephens Shire   ??? Chronic UTI     Followed by Dr. Venia Minks, chronic macrobid   ??? Colon polyps    ??? Glaucoma    ??? Hematuria     followed by Dr. Irean Hong   ??? Hypertension    ??? Osteoporosis     followed by Dr. Debbra Riding   ??? Thyroid disease     low TSH followed by Dr. Janese Banks   ??? Vitamin D deficiency       Past Surgical History:   Procedure Laterality Date   ??? HX COLONOSCOPY  2006   ??? HX GYN      hysterectomy   ??? HX HEENT      tonsillectomy   ??? HX MASTECTOMY Bilateral 1988 and 2002   ??? PR COLONOSCOPY W/BIOPSY SINGLE/MULTIPLE  05/17/2017    polyp     Current Outpatient Medications   Medication Sig Dispense Refill   ??? metoprolol succinate (TOPROL-XL) 100 mg tablet Take 1 Tab by mouth daily. 90 Tab 3   ??? cholecalciferol (VITAMIN D3) 1,000 unit cap Take  by mouth daily (with dinner).     ??? OTHER,NON-FORMULARY, Unknown eye drop for glaucoma     ??? dorzolamide-timolol (COSOPT) 22.3-6.8 mg/mL ophthalmic solution INSTILL 1 DROP INTO BOTH EYES TWICE A DAY  4   ??? latanoprost (XALATAN) 0.005 % ophthalmic solution INSTILL 1 DROP INTO BOTH EYES AT BEDTIME  11   ??? cholecalciferol, vitamin D3, 2,000 unit tab Take  by mouth.     ??? ascorbic acid (VITAMIN C) 500 mg tablet Strength: ; Form: ; SIG: take 1 tablet orally Daily       Allergies   Allergen Reactions    ??? Nsaids (Non-Steroidal Anti-Inflammatory Drug) Other (comments)   ??? Azithromycin Unknown (comments)   ??? Celebrex [Celecoxib] Unknown (comments)   ??? Ibuprofen Unknown (comments)   ??? Penicillins Unknown (comments)   ??? Sulfa (Sulfonamide Antibiotics) Unknown (comments)   ??? Tetracyclines Unknown (comments)       Family History   Problem Relation Age of Onset   ??? Cancer Mother    ??? Heart Disease Father      Social History     Tobacco Use   ??? Smoking status: Never Smoker   ??? Smokeless tobacco: Never Used   Substance Use Topics   ??? Alcohol use: No     Alcohol/week: 0.0 standard drinks       Depression Risk Factor Screening:     3 most recent PHQ Screens 05/02/2019   PHQ Not Done -   Little interest or pleasure in doing things Not at all   Feeling down, depressed,  irritable, or hopeless Not at all   Total Score PHQ 2 0       Alcohol Risk Screen   Do you average more than 1 drink per night or more than 7 drinks a week:  No    On any one occasion in the past three months have you have had more than 3 drinks containing alcohol:  No        Functional Ability and Level of Safety:   Hearing: Hearing is good.     Activities of Daily Living:  The home contains: no safety equipment.  Patient does total self care     Ambulation: with no difficulty     Fall Risk:  Fall Risk Assessment, last 12 mths 05/02/2019   Able to walk? Yes   Fall in past 12 months? No     Abuse Screen:  Patient is not abused       Cognitive Screening   Has your family/caregiver stated any concerns about your memory: yes - patients kids worry about memory but husband feels no decline      Cognitive Screening: Abnormal - Clock Drawing Test    Patient Care Team   Patient Care Team:  Bernadette Hoit, MD as PCP - General (Family Medicine)  Bernadette Hoit, MD as PCP - [facility] Empaneled Provider    Assessment/Plan   Education and counseling provided:  Are appropriate based on today's review and evaluation    Diagnoses and all orders for this visit:    1. Arthritis   -     CBC WITH AUTOMATED DIFF  -     METABOLIC PANEL, COMPREHENSIVE  -     LIPID PANEL WITH LDL/HDL RATIO  -     TSH 3RD GENERATION  -     AMB POC URINALYSIS DIP STICK AUTO W/O MICRO  -     VITAMIN B12  -     VITAMIN D, 25 HYDROXY  -     RPR  -     ANTINUCLEAR ANTIBODIES, IFA  -     SED RATE (ESR)  -     URIC ACID  -     STREPTOLYSIN O (ASO) AB  -     C REACTIVE PROTEIN, QT  -     RHEUMATOID FACTOR, QL    2. Weight loss  -     CBC WITH AUTOMATED DIFF  -     METABOLIC PANEL, COMPREHENSIVE  -     LIPID PANEL WITH LDL/HDL RATIO  -     TSH 3RD GENERATION  -     AMB POC URINALYSIS DIP STICK AUTO W/O MICRO  -     VITAMIN B12  -     VITAMIN D, 25 HYDROXY  -     RPR  -     ANTINUCLEAR ANTIBODIES, IFA  -     SED RATE (ESR)  -     URIC ACID  -     STREPTOLYSIN O (ASO) AB  -     C REACTIVE PROTEIN, QT  -     RHEUMATOID FACTOR, QL    3. Anemia, unspecified type  -     CBC WITH AUTOMATED DIFF  -     METABOLIC PANEL, COMPREHENSIVE  -     LIPID PANEL WITH LDL/HDL RATIO  -     TSH 3RD GENERATION  -     AMB POC URINALYSIS DIP STICK AUTO W/O MICRO  -  VITAMIN B12  -     VITAMIN D, 25 HYDROXY  -     RPR  -     ANTINUCLEAR ANTIBODIES, IFA  -     SED RATE (ESR)  -     URIC ACID  -     STREPTOLYSIN O (ASO) AB  -     C REACTIVE PROTEIN, QT  -     RHEUMATOID FACTOR, QL    4. Vitamin D deficiency  -     CBC WITH AUTOMATED DIFF  -     METABOLIC PANEL, COMPREHENSIVE  -     LIPID PANEL WITH LDL/HDL RATIO  -     TSH 3RD GENERATION  -     AMB POC URINALYSIS DIP STICK AUTO W/O MICRO  -     VITAMIN B12  -     VITAMIN D, 25 HYDROXY  -     RPR  -     ANTINUCLEAR ANTIBODIES, IFA  -     SED RATE (ESR)  -     URIC ACID  -     STREPTOLYSIN O (ASO) AB  -     C REACTIVE PROTEIN, QT  -     RHEUMATOID FACTOR, QL    5. Osteoporosis, unspecified osteoporosis type, unspecified pathological fracture presence  -     CBC WITH AUTOMATED DIFF  -     METABOLIC PANEL, COMPREHENSIVE  -     LIPID PANEL WITH LDL/HDL RATIO  -     TSH 3RD GENERATION   -     AMB POC URINALYSIS DIP STICK AUTO W/O MICRO  -     VITAMIN B12  -     VITAMIN D, 25 HYDROXY  -     RPR  -     ANTINUCLEAR ANTIBODIES, IFA  -     SED RATE (ESR)  -     URIC ACID  -     STREPTOLYSIN O (ASO) AB  -     C REACTIVE PROTEIN, QT  -     RHEUMATOID FACTOR, QL    6. Essential hypertension  -     CBC WITH AUTOMATED DIFF  -     METABOLIC PANEL, COMPREHENSIVE  -     LIPID PANEL WITH LDL/HDL RATIO  -     TSH 3RD GENERATION  -     AMB POC URINALYSIS DIP STICK AUTO W/O MICRO  -     VITAMIN B12  -     VITAMIN D, 25 HYDROXY  -     RPR  -     ANTINUCLEAR ANTIBODIES, IFA  -     SED RATE (ESR)  -     URIC ACID  -     STREPTOLYSIN O (ASO) AB  -     C REACTIVE PROTEIN, QT  -     RHEUMATOID FACTOR, QL    7. Thyroid disease  -     CBC WITH AUTOMATED DIFF  -     METABOLIC PANEL, COMPREHENSIVE  -     LIPID PANEL WITH LDL/HDL RATIO  -     TSH 3RD GENERATION  -     AMB POC URINALYSIS DIP STICK AUTO W/O MICRO  -     VITAMIN B12  -     VITAMIN D, 25 HYDROXY  -     RPR  -     ANTINUCLEAR ANTIBODIES, IFA  -     SED RATE (  ESR)  -     URIC ACID  -     STREPTOLYSIN O (ASO) AB  -     C REACTIVE PROTEIN, QT  -     RHEUMATOID FACTOR, QL    8. Dementia without behavioral disturbance, unspecified dementia type (HCC)  -     CBC WITH AUTOMATED DIFF  -     METABOLIC PANEL, COMPREHENSIVE  -     LIPID PANEL WITH LDL/HDL RATIO  -     TSH 3RD GENERATION  -     AMB POC URINALYSIS DIP STICK AUTO W/O MICRO  -     VITAMIN B12  -     VITAMIN D, 25 HYDROXY  -     RPR  -     ANTINUCLEAR ANTIBODIES, IFA  -     SED RATE (ESR)  -     URIC ACID  -     STREPTOLYSIN O (ASO) AB  -     C REACTIVE PROTEIN, QT  -     RHEUMATOID FACTOR, QL        Health Maintenance Due   Topic Date Due   ??? DTaP/Tdap/Td series (1 - Tdap) 05/13/1957   ??? Shingrix Vaccine Age 81> (1 of 2) 05/13/1986   ??? Medicare Yearly Exam  03/30/2019   ??? Flu Vaccine (1) 05/07/2019

## 2019-05-02 NOTE — Patient Instructions (Addendum)
Medicare Wellness Visit, Female     The best way to live healthy is to have a lifestyle where you eat a well-balanced diet, exercise regularly, limit alcohol use, and quit all forms of tobacco/nicotine, if applicable.     Regular preventive services are another way to keep healthy. Preventive services (vaccines, screening tests, monitoring & exams) can help personalize your care plan, which helps you manage your own care. Screening tests can find health problems at the earliest stages, when they are easiest to treat.   [facility] follows the current, evidence-based guidelines published by the [place] [employer] (USPSTF) when recommending preventive services for our patients. Because we follow these guidelines, sometimes recommendations change over time as research supports it. (For example, mammograms used to be recommended annually. Even though Medicare will still pay for an annual mammogram, the newer guidelines recommend a mammogram every two years for women of average risk).  Of course, you and your doctor may decide to screen more often for some diseases, based on your risk and your co-morbidities (chronic disease you are already diagnosed with).     Preventive services for you include:  - Medicare offers their members a free annual wellness visit, which is time for you and your primary care provider to discuss and plan for your preventive service needs. Take advantage of this benefit every year!  -All adults over the age of 70 should receive the recommended pneumonia vaccines. Current USPSTF guidelines recommend a series of two vaccines for the best pneumonia protection.   -All adults should have a flu vaccine yearly and a tetanus vaccine every 10 years.   -All adults age 34 and older should receive the shingles vaccines (series of two vaccines).      -All adults age 54-70 who are overweight should have a diabetes screening test once every three years.    -All adults born between [birth year] and 1965 should be screened once for Hepatitis C.  -Other screening tests and preventive services for persons with diabetes include: an eye exam to screen for diabetic retinopathy, a kidney function test, a foot exam, and stricter control over your cholesterol.   -Cardiovascular screening for adults with routine risk involves an electrocardiogram (ECG) at intervals determined by your doctor.   -Colorectal cancer screenings should be done for adults age 82-75 with no increased risk factors for colorectal cancer.  There are a number of acceptable methods of screening for this type of cancer. Each test has its own benefits and drawbacks. Discuss with your doctor what is most appropriate for you during your annual wellness visit. The different tests include: colonoscopy (considered the best screening method), a fecal occult blood test, a fecal DNA test, and sigmoidoscopy.    -A bone mass density test is recommended when a woman turns 65 to screen for osteoporosis. This test is only recommended one time, as a screening. Some providers will use this same test as a disease monitoring tool if you already have osteoporosis.  -Breast cancer screenings are recommended every other year for women of normal risk, age 42-74.  -Cervical cancer screenings for women over age 24 are only recommended with certain risk factors.     Here is a list of your current Health Maintenance items (your personalized list of preventive services) with a due date:  Health Maintenance Due   Topic Date Due   ??? DTaP/Tdap/Td  (1 - Tdap) 05/13/1955   ??? Shingles Vaccine (1 of 2) 05/13/1986   ???  Annual Well Visit  03/30/2019   ??? Yearly Flu Vaccine (1) 05/07/2019            Well Visit, Over 45: Care Instructions  Your Care Instructions     Physical exams can help you stay healthy. Your doctor has checked your overall health and may have suggested ways to take good care of yourself.  He or she also may have recommended tests. At home, you can help prevent illness with healthy eating, regular exercise, and other steps.  Follow-up care is a key part of your treatment and safety. Be sure to make and go to all appointments, and call your doctor if you are having problems. It's also a good idea to know your test results and keep a list of the medicines you take.  How can you care for yourself at home?  ?? Reach and stay at a healthy weight. This will lower your risk for many problems, such as obesity, diabetes, heart disease, and high blood pressure.  ?? Get at least 30 minutes of exercise on most days of the week. Walking is a good choice. You also may want to do other activities, such as running, swimming, cycling, or playing tennis or team sports.  ?? Do not smoke. Smoking can make health problems worse. If you need help quitting, talk to your doctor about stop-smoking programs and medicines. These can increase your chances of quitting for good.  ?? Protect your skin from too much sun. When you're outdoors from 10 a.m. to 4 p.m., stay in the shade or cover up with clothing and a hat with a wide brim. Wear sunglasses that block UV rays. Even when it's cloudy, put broad-spectrum sunscreen (SPF 30 or higher) on any exposed skin.  ?? See a dentist one or two times a year for checkups and to have your teeth cleaned.  ?? Wear a seat belt in the car.  Follow your doctor's advice about when to have certain tests. These tests can spot problems early.  For men and women  ?? Cholesterol. Your doctor will tell you how often to have this done based on your overall health and other things that can increase your risk for heart attack and stroke.  ?? Blood pressure. Have your blood pressure checked during a routine doctor visit. Your doctor will tell you how often to check your blood pressure based on your age, your blood pressure results, and other factors.   ?? Diabetes. Ask your doctor whether you should have tests for diabetes.  ?? Vision. Experts recommend that you have yearly exams for glaucoma and other age-related eye problems.  ?? Hearing. Tell your doctor if you notice any change in your hearing. You can have tests to find out how well you hear.  ?? Colon cancer tests. Keep having colon cancer tests as your doctor recommends. You can have one of several types of tests.  ?? Heart attack and stroke risk. At least every 4 to 6 years, you should have your risk for heart attack and stroke assessed. Your doctor uses factors such as your age, blood pressure, cholesterol, and whether you smoke or have diabetes to show what your risk for a heart attack or stroke is over the next 10 years.  ?? Osteoporosis. Talk to your doctor about whether you should have a bone density test to find out whether you have thinning bones. Ask your doctor if you need to take a calcium plus vitamin D supplement. You may be able  to get enough calcium and vitamin D through your diet.  For women  ?? Pap test and pelvic exam. You may no longer need a Pap test. Talk with your doctor about whether to stop or continue to have Pap tests.  ?? Breast exam and mammogram. Ask how often you should have a mammogram, which is an X-ray of your breasts. A mammogram can spot breast cancer before it can be felt and when it is easiest to treat.  ?? Thyroid disease. Talk to your doctor about whether to have your thyroid checked as part of a regular physical exam. Women have an increased chance of a thyroid problem.  For men  ?? Prostate exam. Talk to your doctor about whether you should have a blood test (called a PSA test) for prostate cancer. Experts recommend that you discuss the benefits and risks of the test with your doctor before you decide whether to have this test. Some experts say that men ages 5 and older no longer need testing.  ?? Abdominal aortic aneurysm. Ask your doctor whether you should have a  test to check for an aneurysm. You may need a test if you ever smoked or if your parent, brother, sister, or child has had an aneurysm.  When should you call for help?  Watch closely for changes in your health, and be sure to contact your doctor if you have any problems or symptoms that concern you.  Where can you learn more?  Go to [email or web address]-rivera.info/  Enter [phone] in the search box to learn more about "Well Visit, Over 65: Care Instructions."  Current as of: Jan 30, 2019??????????????????????????????Content Version: 12.6  ?? 2006-2020 [employer].   Care instructions adapted under license by [employer] (which disclaims liability or warranty for this information). If you have questions about a medical condition or this instruction, always ask your healthcare professional. [facility] any warranty or liability for your use of this information.

## 2019-05-02 NOTE — Progress Notes (Signed)
HISTORY OF PRESENT ILLNESS  Melanie Houston is a 85 y.o. female.  Thyroid Problem   The history is provided by the patient. This is a recurrent problem. The problem has not changed since onset.Pertinent negatives include no chest pain, no abdominal pain, no headaches and no shortness of breath. Nothing (denies change in appetite etc ) aggravates the symptoms. The symptoms are relieved by medications.   Weight Loss   The history is provided by the patient (denies change in appetite etc ). This is a new problem. Pertinent negatives include no chest pain, no abdominal pain, no headaches and no shortness of breath.   Memory Loss    The history is provided by the patient (husband reports stable and non progressive , kids are more worried and she reports this makes her more anxious and feels this makes it worse ). This is a recurrent problem. The problem has not changed since onset.  Eye Problem   The history is provided by the patient (glaucoma). This is a recurrent problem. The problem has not changed since onset.Pertinent negatives include no chest pain, no abdominal pain, no headaches and no shortness of breath.     Allergies   Allergen Reactions   ??? Nsaids (Non-Steroidal Anti-Inflammatory Drug) Other (comments)   ??? Azithromycin Unknown (comments)   ??? Celebrex [Celecoxib] Unknown (comments)   ??? Ibuprofen Unknown (comments)   ??? Penicillins Unknown (comments)   ??? Sulfa (Sulfonamide Antibiotics) Unknown (comments)   ??? Tetracyclines Unknown (comments)     Current Outpatient Medications on File Prior to Visit   Medication Sig Dispense Refill   ??? metoprolol succinate (TOPROL-XL) 100 mg tablet Take 1 Tab by mouth daily. 90 Tab 3   ??? cholecalciferol (VITAMIN D3) 1,000 unit cap Take  by mouth daily (with dinner).     ??? OTHER,NON-FORMULARY, Unknown eye drop for glaucoma     ??? dorzolamide-timolol (COSOPT) 22.3-6.8 mg/mL ophthalmic solution INSTILL 1 DROP INTO BOTH EYES TWICE A DAY  4    ??? latanoprost (XALATAN) 0.005 % ophthalmic solution INSTILL 1 DROP INTO BOTH EYES AT BEDTIME  11   ??? cholecalciferol, vitamin D3, 2,000 unit tab Take  by mouth.     ??? ascorbic acid (VITAMIN C) 500 mg tablet Strength: ; Form: ; SIG: take 1 tablet orally Daily       No current facility-administered medications on file prior to visit.      Past Medical History:   Diagnosis Date   ??? Arthritis     osteo-arthritis   ??? Cancer ([facility])     Bilateral Breast Cancer   ??? Chronic kidney disease     followed by Dr. Stephens Shire   ??? Chronic UTI     Followed by Dr. Venia Minks, chronic macrobid   ??? Colon polyps    ??? Glaucoma    ??? Hematuria     followed by Dr. Irean Hong   ??? Hypertension    ??? Osteoporosis     followed by Dr. Debbra Riding   ??? Thyroid disease     low TSH followed by Dr. Janese Banks   ??? Vitamin D deficiency      Past Surgical History:   Procedure Laterality Date   ??? HX COLONOSCOPY  2006   ??? HX GYN      hysterectomy   ??? HX HEENT      tonsillectomy   ??? HX MASTECTOMY Bilateral 1988 and 2002   ??? PR COLONOSCOPY W/BIOPSY SINGLE/MULTIPLE  05/17/2017    polyp  Social History     Socioeconomic History   ??? Marital status: MARRIED     Spouse name: Not on file   ??? Number of children: Not on file   ??? Years of education: Not on file   ??? Highest education level: Not on file   Occupational History   ??? Not on file   Social Needs   ??? Financial resource strain: Not on file   ??? Food insecurity     Worry: Not on file     Inability: Not on file   ??? Transportation needs     Medical: Not on file     Non-medical: Not on file   Tobacco Use   ??? Smoking status: Never Smoker   ??? Smokeless tobacco: Never Used   Substance and Sexual Activity   ??? Alcohol use: No     Alcohol/week: 0.0 standard drinks   ??? Drug use: No   ??? Sexual activity: Not on file   Lifestyle   ??? Physical activity     Days per week: Not on file     Minutes per session: Not on file   ??? Stress: Not on file   Relationships   ??? Social Product manager on phone: Not on file      Gets together: Not on file     Attends religious service: Not on file     Active member of club or organization: Not on file     Attends meetings of clubs or organizations: Not on file     Relationship status: Not on file   ??? Intimate partner violence     Fear of current or ex partner: Not on file     Emotionally abused: Not on file     Physically abused: Not on file     Forced sexual activity: Not on file   Other Topics Concern   ??? Not on file   Social History Narrative   ??? Not on file       Review of Systems   Constitutional: Negative for chills, fever and malaise/fatigue.   HENT: Negative for congestion and hearing loss.    Eyes: Negative for blurred vision, pain and discharge.   Respiratory: Negative for cough, hemoptysis, shortness of breath and wheezing.    Cardiovascular: Negative for chest pain, palpitations and leg swelling.   Gastrointestinal: Negative for abdominal pain, blood in stool, constipation, diarrhea, heartburn and nausea.   Genitourinary: Negative for dysuria, frequency, hematuria and urgency.   Musculoskeletal: Negative for joint pain and myalgias.   Skin: Negative for rash.   Neurological: Negative for dizziness, sensory change and headaches.   Endo/Heme/Allergies: Negative for polydipsia. Does not bruise/bleed easily.   Psychiatric/Behavioral: Positive for memory loss. Negative for depression. The patient is not nervous/anxious and does not have insomnia.      Blood pressure 116/64, pulse 70, temperature 97.2 ??F (36.2 ??C), temperature source Temporal, height 5' 3.25" (1.607 m), weight 87 lb 3.2 oz (39.6 kg), SpO2 96 %.      Physical Exam  Vitals signs and nursing note reviewed.   Constitutional:       General: She is not in acute distress.     Appearance: She is well-developed.   HENT:      Head: Atraumatic.      Right Ear: External ear normal.      Left Ear: External ear normal.      Nose: Nose normal.  Mouth/Throat:      Pharynx: No oropharyngeal exudate.   Eyes:       General: No scleral icterus.        Right eye: No discharge.         Left eye: No discharge.      Conjunctiva/sclera: Conjunctivae normal.      Pupils: Pupils are equal, round, and reactive to light.   Neck:      Musculoskeletal: Normal range of motion.      Thyroid: No thyromegaly.   Cardiovascular:      Rate and Rhythm: Normal rate and regular rhythm.      Heart sounds: Normal heart sounds. No murmur. No friction rub. No gallop.    Pulmonary:      Effort: Pulmonary effort is normal. No respiratory distress.      Breath sounds: Normal breath sounds. No wheezing or rales.   Chest:      Chest wall: No tenderness.   Abdominal:      General: There is no distension.      Palpations: There is no mass.      Tenderness: There is no abdominal tenderness. There is no guarding or rebound.   Musculoskeletal: Normal range of motion.         General: No tenderness.   Lymphadenopathy:      Cervical: No cervical adenopathy.   Skin:     General: Skin is warm and dry.   Neurological:      Mental Status: She is alert and oriented to person, place, and time.      Coordination: Coordination normal.       Office Visit on 05/02/2019   Component Date Value Ref Range Status   ??? WBC 05/02/2019 8.1  3.4 - 10.8 x10E3/uL Final   ??? RBC 05/02/2019 4.67  3.77 - 5.28 x10E6/uL Final   ??? HGB 05/02/2019 14.7  11.1 - 15.9 g/dL Final   ??? HCT 05/02/2019 43.4  34.0 - 46.6 % Final   ??? MCV 05/02/2019 93  79 - 97 fL Final   ??? MCH 05/02/2019 31.5  26.6 - 33.0 pg Final   ??? MCHC 05/02/2019 33.9  31.5 - 35.7 g/dL Final   ??? RDW 05/02/2019 12.3  11.7 - 15.4 % Final   ??? PLATELET 05/02/2019 190  150 - 450 x10E3/uL Final   ??? NEUTROPHILS 05/02/2019 69  Not Estab. % Final   ??? Lymphocytes 05/02/2019 18  Not Estab. % Final   ??? MONOCYTES 05/02/2019 11  Not Estab. % Final   ??? EOSINOPHILS 05/02/2019 1  Not Estab. % Final   ??? BASOPHILS 05/02/2019 1  Not Estab. % Final   ??? ABS. NEUTROPHILS 05/02/2019 5.6  1.4 - 7.0 x10E3/uL Final    ??? Abs Lymphocytes 05/02/2019 1.4  0.7 - 3.1 x10E3/uL Final   ??? ABS. MONOCYTES 05/02/2019 0.9  0.1 - 0.9 x10E3/uL Final   ??? ABS. EOSINOPHILS 05/02/2019 0.1  0.0 - 0.4 x10E3/uL Final   ??? ABS. BASOPHILS 05/02/2019 0.1  0.0 - 0.2 x10E3/uL Final   ??? IMMATURE GRANULOCYTES 05/02/2019 0  Not Estab. % Final   ??? ABS. IMM. GRANS. 05/02/2019 0.0  0.0 - 0.1 x10E3/uL Final   ??? Glucose 05/02/2019 106* 65 - 99 mg/dL Final   ??? BUN 05/02/2019 25  8 - 27 mg/dL Final   ??? Creatinine 05/02/2019 0.98  0.57 - 1.00 mg/dL Final   ??? GFR est non-AA 05/02/2019 54* >59 mL/min/1.73 Final   ??? GFR est  AA 05/02/2019 62  >59 mL/min/1.73 Final   ??? BUN/Creatinine ratio 05/02/2019 26  12 - 28 Final   ??? Sodium 05/02/2019 141  134 - 144 mmol/L Final   ??? Potassium 05/02/2019 4.0  3.5 - 5.2 mmol/L Final   ??? Chloride 05/02/2019 103  96 - 106 mmol/L Final   ??? CO2 05/02/2019 23  20 - 29 mmol/L Final   ??? Calcium 05/02/2019 9.7  8.7 - 10.3 mg/dL Final   ??? Protein, total 05/02/2019 6.7  6.0 - 8.5 g/dL Final   ??? Albumin 05/02/2019 4.4  3.6 - 4.6 g/dL Final   ??? GLOBULIN, TOTAL 05/02/2019 2.3  1.5 - 4.5 g/dL Final   ??? A-G Ratio 05/02/2019 1.9  1.2 - 2.2 Final   ??? Bilirubin, total 05/02/2019 0.7  0.0 - 1.2 mg/dL Final   ??? Alk. phosphatase 05/02/2019 64  39 - 117 IU/L Final   ??? AST (SGOT) 05/02/2019 22  0 - 40 IU/L Final   ??? ALT (SGPT) 05/02/2019 17  0 - 32 IU/L Final   ??? Cholesterol, total 05/02/2019 202* 100 - 199 mg/dL Final   ??? Triglyceride 05/02/2019 83  0 - 149 mg/dL Final   ??? HDL Cholesterol 05/02/2019 76  >39 mg/dL Final   ??? VLDL, calculated 05/02/2019 17  5 - 40 mg/dL Final   ??? LDL, calculated 05/02/2019 109* 0 - 99 mg/dL Final    Comment: **Effective May 06, 2019, [facility] is implementing an improved**  equation to calculate Low Density Lipoprotein Cholesterol (LDL-C)  concentrations, to be used in all lipid panels that report calculated  LDL-C. This equation was developed through a collaboration with the   [employer] [place] (NIH).[1] The NIH  calculation overcomes the limitations of the existing [place]  LDL-C equation and performs equally well in both fasting and  non-fasting individuals.  1. Pauline Good Q, et al. A new equation for  calculation of low-density lipoprotein cholesterol in patients with  normolipidemia and/or hypertriglyceridemia. JAMA Cardiol.  2020 Feb 26. doi:[email or web address]     ??? LDL/HDL Ratio 05/02/2019 1.4  0.0 - 3.2 ratio Final    Comment:                                     LDL/HDL Ratio                                              Men  Women                                1/2 Avg.Risk  1.0    1.5                                    Avg.Risk  3.6    3.2                                 2X Avg.Risk  6.2    5.0  3X Avg.Risk  8.0    6.1     ??? TSH 05/02/2019 2.690  0.450 - 4.500 uIU/mL Final   ??? Color (UA POC) 05/02/2019 Yellow   Final   ??? Clarity (UA POC) 05/02/2019 Clear   Final   ??? Glucose (UA POC) 05/02/2019 Negative  Negative Final   ??? Bilirubin (UA POC) 05/02/2019 Negative  Negative Final   ??? Ketones (UA POC) 05/02/2019 Negative  Negative Final   ??? Specific gravity (UA POC) 05/02/2019 1.020  1.001 - 1.035 Final   ??? Blood (UA POC) 05/02/2019 Negative  Negative Final   ??? pH (UA POC) 05/02/2019 6.0  4.6 - 8.0 Final   ??? Protein (UA POC) 05/02/2019 Trace  Negative Final   ??? Urobilinogen (UA POC) 05/02/2019 normal  0.2 - 1 Final   ??? Nitrites (UA POC) 05/02/2019 Negative  Negative Final   ??? Leukocyte esterase (UA POC) 05/02/2019 Negative  Negative Final   ??? Vitamin B12 05/02/2019 568  232 - 1,245 pg/mL Final   ??? VITAMIN D, 25-HYDROXY 05/02/2019 68.3  30.0 - 100.0 ng/mL Final    Comment: Vitamin D deficiency has been defined by the Institute of  Medicine and an Endocrine Society practice guideline as a  level of serum 25-OH vitamin D less than 20 ng/mL (1,2).   The Endocrine Society went on to further define vitamin D  insufficiency as a level between 21 and 29 ng/mL (2).  1. IOM (Institute of Medicine). 2010. Dietary reference     intakes for calcium and D. [place]: The     [employer].  2. Holick MF, Binkley NC, Bischoff-Ferrari HA, et al.     Evaluation, treatment, and prevention of vitamin D     deficiency: an Endocrine Society clinical practice     guideline. JCEM. 2011 Jul; 96(7):1911-30.     ??? RPR 05/02/2019 Non Reactive  Non Reactive Final   ??? Antinuclear Abs, IFA 05/02/2019 Negative   Final    Comment:                                      Negative   <1:80                                       Borderline  1:80                                       Positive   >1:80     ??? Sed rate (ESR) 05/02/2019 2  0 - 40 mm/hr Final   ??? Uric acid 05/02/2019 3.8  2.5 - 7.1 mg/dL Final               Therapeutic target for gout patients: <6.0   ??? Anti-streptolysin O Ab 05/02/2019 64.0  0.0 - 200.0 IU/mL Final   ??? C-Reactive Protein, Qt 05/02/2019 <1  0 - 10 mg/L Final   ??? Rheumatoid factor 05/02/2019 <10.0  0.0 - 13.9 IU/mL Final         ASSESSMENT and PLAN    ICD-10-CM ICD-9-CM    1. Arthritis  M19.90 716.90 CBC WITH AUTOMATED DIFF      METABOLIC PANEL, COMPREHENSIVE      LIPID  PANEL WITH LDL/HDL RATIO      TSH 3RD GENERATION      AMB POC URINALYSIS DIP STICK AUTO W/O MICRO      VITAMIN B12      VITAMIN D, 25 HYDROXY      RPR      ANTINUCLEAR ANTIBODIES, IFA      SED RATE (ESR)      URIC ACID      STREPTOLYSIN O (ASO) AB      C REACTIVE PROTEIN, QT      RHEUMATOID FACTOR, QL   2. Weight loss  R63.4 783.21 CBC WITH AUTOMATED DIFF      METABOLIC PANEL, COMPREHENSIVE      LIPID PANEL WITH LDL/HDL RATIO      TSH 3RD GENERATION      AMB POC URINALYSIS DIP STICK AUTO W/O MICRO      VITAMIN B12      VITAMIN D, 25 HYDROXY      RPR      ANTINUCLEAR ANTIBODIES, IFA      SED RATE (ESR)      URIC ACID      STREPTOLYSIN O (ASO) AB      C REACTIVE PROTEIN, QT       RHEUMATOID FACTOR, QL   3. Anemia, unspecified type  D64.9 285.9 CBC WITH AUTOMATED DIFF      METABOLIC PANEL, COMPREHENSIVE      LIPID PANEL WITH LDL/HDL RATIO      TSH 3RD GENERATION      AMB POC URINALYSIS DIP STICK AUTO W/O MICRO      VITAMIN B12      VITAMIN D, 25 HYDROXY      RPR      ANTINUCLEAR ANTIBODIES, IFA      SED RATE (ESR)      URIC ACID      STREPTOLYSIN O (ASO) AB      C REACTIVE PROTEIN, QT      RHEUMATOID FACTOR, QL   4. Vitamin D deficiency  E55.9 268.9 CBC WITH AUTOMATED DIFF      METABOLIC PANEL, COMPREHENSIVE      LIPID PANEL WITH LDL/HDL RATIO      TSH 3RD GENERATION      AMB POC URINALYSIS DIP STICK AUTO W/O MICRO      VITAMIN B12      VITAMIN D, 25 HYDROXY      RPR      ANTINUCLEAR ANTIBODIES, IFA      SED RATE (ESR)      URIC ACID      STREPTOLYSIN O (ASO) AB      C REACTIVE PROTEIN, QT      RHEUMATOID FACTOR, QL   5. Osteoporosis, unspecified osteoporosis type, unspecified pathological fracture presence  M81.0 733.00 CBC WITH AUTOMATED DIFF      METABOLIC PANEL, COMPREHENSIVE      LIPID PANEL WITH LDL/HDL RATIO      TSH 3RD GENERATION      AMB POC URINALYSIS DIP STICK AUTO W/O MICRO      VITAMIN B12      VITAMIN D, 25 HYDROXY      RPR      ANTINUCLEAR ANTIBODIES, IFA      SED RATE (ESR)      URIC ACID      STREPTOLYSIN O (ASO) AB      C REACTIVE PROTEIN, QT      RHEUMATOID FACTOR, QL   6. Essential hypertension  I10  401.9 CBC WITH AUTOMATED DIFF      METABOLIC PANEL, COMPREHENSIVE      LIPID PANEL WITH LDL/HDL RATIO      TSH 3RD GENERATION      AMB POC URINALYSIS DIP STICK AUTO W/O MICRO      VITAMIN B12      VITAMIN D, 25 HYDROXY      RPR      ANTINUCLEAR ANTIBODIES, IFA      SED RATE (ESR)      URIC ACID      STREPTOLYSIN O (ASO) AB      C REACTIVE PROTEIN, QT      RHEUMATOID FACTOR, QL   7. Thyroid disease  E07.9 246.9 CBC WITH AUTOMATED DIFF      METABOLIC PANEL, COMPREHENSIVE      LIPID PANEL WITH LDL/HDL RATIO      TSH 3RD GENERATION       AMB POC URINALYSIS DIP STICK AUTO W/O MICRO      VITAMIN B12      VITAMIN D, 25 HYDROXY      RPR      ANTINUCLEAR ANTIBODIES, IFA      SED RATE (ESR)      URIC ACID      STREPTOLYSIN O (ASO) AB      C REACTIVE PROTEIN, QT      RHEUMATOID FACTOR, QL   8. Dementia without behavioral disturbance, unspecified dementia type (HCC)  F03.90 294.20 CBC WITH AUTOMATED DIFF      METABOLIC PANEL, COMPREHENSIVE      LIPID PANEL WITH LDL/HDL RATIO      TSH 3RD GENERATION      AMB POC URINALYSIS DIP STICK AUTO W/O MICRO      VITAMIN B12      VITAMIN D, 25 HYDROXY      RPR      ANTINUCLEAR ANTIBODIES, IFA      SED RATE (ESR)      URIC ACID      STREPTOLYSIN O (ASO) AB      C REACTIVE PROTEIN, QT      RHEUMATOID FACTOR, QL   9. Medicare annual wellness visit, subsequent  Z00.00 V70.0    10. Screening for depression  Z13.31 V79.0 DEPRESSION SCREEN ANNUAL     Diagnoses and all orders for this visit:    1. Arthritis  -     CBC WITH AUTOMATED DIFF  -     METABOLIC PANEL, COMPREHENSIVE  -     LIPID PANEL WITH LDL/HDL RATIO  -     TSH 3RD GENERATION  -     AMB POC URINALYSIS DIP STICK AUTO W/O MICRO  -     VITAMIN B12  -     VITAMIN D, 25 HYDROXY  -     RPR  -     ANTINUCLEAR ANTIBODIES, IFA  -     SED RATE (ESR)  -     URIC ACID  -     STREPTOLYSIN O (ASO) AB  -     C REACTIVE PROTEIN, QT  -     RHEUMATOID FACTOR, QL    2. Weight loss  -     CBC WITH AUTOMATED DIFF  -     METABOLIC PANEL, COMPREHENSIVE  -     LIPID PANEL WITH LDL/HDL RATIO  -     TSH 3RD GENERATION  -     AMB POC URINALYSIS DIP STICK AUTO W/O MICRO  -  VITAMIN B12  -     VITAMIN D, 25 HYDROXY  -     RPR  -     ANTINUCLEAR ANTIBODIES, IFA  -     SED RATE (ESR)  -     URIC ACID  -     STREPTOLYSIN O (ASO) AB  -     C REACTIVE PROTEIN, QT  -     RHEUMATOID FACTOR, QL    3. Anemia, unspecified type  -     CBC WITH AUTOMATED DIFF  -     METABOLIC PANEL, COMPREHENSIVE  -     LIPID PANEL WITH LDL/HDL RATIO  -     TSH 3RD GENERATION   -     AMB POC URINALYSIS DIP STICK AUTO W/O MICRO  -     VITAMIN B12  -     VITAMIN D, 25 HYDROXY  -     RPR  -     ANTINUCLEAR ANTIBODIES, IFA  -     SED RATE (ESR)  -     URIC ACID  -     STREPTOLYSIN O (ASO) AB  -     C REACTIVE PROTEIN, QT  -     RHEUMATOID FACTOR, QL    4. Vitamin D deficiency  -     CBC WITH AUTOMATED DIFF  -     METABOLIC PANEL, COMPREHENSIVE  -     LIPID PANEL WITH LDL/HDL RATIO  -     TSH 3RD GENERATION  -     AMB POC URINALYSIS DIP STICK AUTO W/O MICRO  -     VITAMIN B12  -     VITAMIN D, 25 HYDROXY  -     RPR  -     ANTINUCLEAR ANTIBODIES, IFA  -     SED RATE (ESR)  -     URIC ACID  -     STREPTOLYSIN O (ASO) AB  -     C REACTIVE PROTEIN, QT  -     RHEUMATOID FACTOR, QL    5. Osteoporosis, unspecified osteoporosis type, unspecified pathological fracture presence  -     CBC WITH AUTOMATED DIFF  -     METABOLIC PANEL, COMPREHENSIVE  -     LIPID PANEL WITH LDL/HDL RATIO  -     TSH 3RD GENERATION  -     AMB POC URINALYSIS DIP STICK AUTO W/O MICRO  -     VITAMIN B12  -     VITAMIN D, 25 HYDROXY  -     RPR  -     ANTINUCLEAR ANTIBODIES, IFA  -     SED RATE (ESR)  -     URIC ACID  -     STREPTOLYSIN O (ASO) AB  -     C REACTIVE PROTEIN, QT  -     RHEUMATOID FACTOR, QL    6. Essential hypertension  -     CBC WITH AUTOMATED DIFF  -     METABOLIC PANEL, COMPREHENSIVE  -     LIPID PANEL WITH LDL/HDL RATIO  -     TSH 3RD GENERATION  -     AMB POC URINALYSIS DIP STICK AUTO W/O MICRO  -     VITAMIN B12  -     VITAMIN D, 25 HYDROXY  -     RPR  -     ANTINUCLEAR ANTIBODIES, IFA  -     SED  RATE (ESR)  -     URIC ACID  -     STREPTOLYSIN O (ASO) AB  -     C REACTIVE PROTEIN, QT  -     RHEUMATOID FACTOR, QL    7. Thyroid disease  -     CBC WITH AUTOMATED DIFF  -     METABOLIC PANEL, COMPREHENSIVE  -     LIPID PANEL WITH LDL/HDL RATIO  -     TSH 3RD GENERATION  -     AMB POC URINALYSIS DIP STICK AUTO W/O MICRO  -     VITAMIN B12  -     VITAMIN D, 25 HYDROXY  -     RPR  -     ANTINUCLEAR ANTIBODIES, IFA   -     SED RATE (ESR)  -     URIC ACID  -     STREPTOLYSIN O (ASO) AB  -     C REACTIVE PROTEIN, QT  -     RHEUMATOID FACTOR, QL    8. Dementia without behavioral disturbance, unspecified dementia type (HCC)  -     CBC WITH AUTOMATED DIFF  -     METABOLIC PANEL, COMPREHENSIVE  -     LIPID PANEL WITH LDL/HDL RATIO  -     TSH 3RD GENERATION  -     AMB POC URINALYSIS DIP STICK AUTO W/O MICRO  -     VITAMIN B12  -     VITAMIN D, 25 HYDROXY  -     RPR  -     ANTINUCLEAR ANTIBODIES, IFA  -     SED RATE (ESR)  -     URIC ACID  -     STREPTOLYSIN O (ASO) AB  -     C REACTIVE PROTEIN, QT  -     RHEUMATOID FACTOR, QL    9. Medicare annual wellness visit, subsequent    10. Screening for depression  -     DEPRESSION SCREEN ANNUAL      current treatment plan is effective, no change in therapy

## 2019-05-04 LAB — CBC WITH AUTO DIFFERENTIAL
Basophils %: 1 %
Basophils Absolute: 0.1 10*3/uL (ref 0.0–0.2)
Eosinophils %: 1 %
Eosinophils Absolute: 0.1 10*3/uL (ref 0.0–0.4)
Granulocyte Absolute Count: 0 10*3/uL (ref 0.0–0.1)
Hematocrit: 43.4 % (ref 34.0–46.6)
Hemoglobin: 14.7 g/dL (ref 11.1–15.9)
Immature Granulocytes: 0 %
Lymphocytes %: 18 %
Lymphocytes Absolute: 1.4 10*3/uL (ref 0.7–3.1)
MCH: 31.5 pg (ref 26.6–33.0)
MCHC: 33.9 g/dL (ref 31.5–35.7)
MCV: 93 fL (ref 79–97)
Monocytes %: 11 %
Monocytes Absolute: 0.9 10*3/uL (ref 0.1–0.9)
Neutrophils %: 69 %
Neutrophils Absolute: 5.6 10*3/uL (ref 1.4–7.0)
Platelets: 190 10*3/uL (ref 150–450)
RBC: 4.67 x10E6/uL (ref 3.77–5.28)
RDW: 12.3 % (ref 11.7–15.4)
WBC: 8.1 10*3/uL (ref 3.4–10.8)

## 2019-05-04 LAB — COMPREHENSIVE METABOLIC PANEL
ALT: 17 IU/L (ref 0–32)
AST: 22 IU/L (ref 0–40)
Albumin/Globulin Ratio: 1.9 NA (ref 1.2–2.2)
Albumin: 4.4 g/dL (ref 3.6–4.6)
Alkaline Phosphatase: 64 IU/L (ref 39–117)
BUN: 25 mg/dL (ref 8–27)
Bun/Cre Ratio: 26 NA (ref 12–28)
CO2: 23 mmol/L (ref 20–29)
Calcium: 9.7 mg/dL (ref 8.7–10.3)
Chloride: 103 mmol/L (ref 96–106)
Creatinine: 0.98 mg/dL (ref 0.57–1.00)
EGFR IF NonAfrican American: 54 mL/min/{1.73_m2} — ABNORMAL LOW (ref >59)
GFR African American: 62 mL/min/{1.73_m2} (ref >59)
Globulin, Total: 2.3 g/dL (ref 1.5–4.5)
Glucose: 106 mg/dL — ABNORMAL HIGH (ref 65–99)
Potassium: 4 mmol/L (ref 3.5–5.2)
Sodium: 141 mmol/L (ref 134–144)
Total Bilirubin: 0.7 mg/dL (ref 0.0–1.2)
Total Protein: 6.7 g/dL (ref 6.0–8.5)

## 2019-05-04 LAB — LIPID PANEL WITH LDL/HDL RATIO
Cholesterol, Total: 202 mg/dL — ABNORMAL HIGH (ref 100–199)
Cholesterol, total: 202 mg/dL — ABNORMAL HIGH (ref 100–199)
HDL Cholesterol: 76 mg/dL (ref >39)
HDL: 76 mg/dL (ref >39)
LDL Calculated: 109 mg/dL — ABNORMAL HIGH (ref 0–99)
LDL, calculated: 109 mg/dL — ABNORMAL HIGH (ref 0–99)
LDL/HDL Ratio: 1.4 ratio (ref 0.0–3.2)
LDL/HDL Ratio: 1.4 ratio (ref 0.0–3.2)
Triglyceride: 83 mg/dL (ref 0–149)
Triglycerides: 83 mg/dL (ref 0–149)
VLDL Cholesterol Calculated: 17 mg/dL (ref 5–40)
VLDL, calculated: 17 mg/dL (ref 5–40)

## 2019-05-04 LAB — RPR
RPR: NONREACTIVE
RPR: NONREACTIVE

## 2019-05-04 LAB — STREPTOLYSIN O (ASO) AB
ANTI-STREPTOLYSIN O AB, 006031: 64 IU/mL (ref 0.0–200.0)
Anti-streptolysin O Ab: 64 IU/mL (ref 0.0–200.0)

## 2019-05-04 LAB — RHEUMATOID FACTOR, QUALITATIVE: Rheumatoid Factor: <10 IU/mL (ref 0.0–13.9)

## 2019-05-04 LAB — URIC ACID
Uric Acid, Serum: 3.8 mg/dL (ref 2.5–7.1)
Uric acid: 3.8 mg/dL (ref 2.5–7.1)

## 2019-05-04 LAB — VITAMIN B12
Vitamin B-12: 568 pg/mL (ref 232–1245)
Vitamin B12: 568 pg/mL (ref 232–1245)

## 2019-05-04 LAB — VITAMIN D 25 HYDROXY: Vit D, 25-Hydroxy: 68.3 ng/mL (ref 30.0–100.0)

## 2019-05-04 LAB — C-REACTIVE PROTEIN: CRP: <1 mg/L (ref 0–10)

## 2019-05-04 LAB — TSH 3RD GENERATION
TSH: 2.69 u[IU]/mL (ref 0.450–4.500)
TSH: 2.69 u[IU]/mL (ref 0.450–4.500)

## 2019-05-04 LAB — ANTINUCLEAR ANTIBODIES, IFA
Antinuclear Abs, IFA: NEGATIVE
Antinuclear Abs, IFA: NEGATIVE

## 2019-05-04 LAB — SEDIMENTATION RATE: Sed Rate: 2 mm/hr (ref 0–40)

## 2019-05-04 LAB — METABOLIC PANEL, COMPREHENSIVE
A-G Ratio: 1.9 (ref 1.2–2.2)
ALT (SGPT): 17 IU/L (ref 0–32)
AST (SGOT): 22 IU/L (ref 0–40)
Albumin: 4.4 g/dL (ref 3.6–4.6)
Alk. phosphatase: 64 IU/L (ref 39–117)
BUN/Creatinine ratio: 26 (ref 12–28)
BUN: 25 mg/dL (ref 8–27)
Bilirubin, total: 0.7 mg/dL (ref 0.0–1.2)
CO2: 23 mmol/L (ref 20–29)
Calcium: 9.7 mg/dL (ref 8.7–10.3)
Chloride: 103 mmol/L (ref 96–106)
Creatinine: 0.98 mg/dL (ref 0.57–1.00)
GFR est AA: 62 mL/min/{1.73_m2} (ref >59)
GFR est non-AA: 54 mL/min/{1.73_m2} — ABNORMAL LOW (ref >59)
GLOBULIN, TOTAL: 2.3 g/dL (ref 1.5–4.5)
Glucose: 106 mg/dL — ABNORMAL HIGH (ref 65–99)
Potassium: 4 mmol/L (ref 3.5–5.2)
Protein, total: 6.7 g/dL (ref 6.0–8.5)
Sodium: 141 mmol/L (ref 134–144)

## 2019-05-04 LAB — CBC WITH AUTOMATED DIFF
ABS. BASOPHILS: 0.1 10*3/uL (ref 0.0–0.2)
ABS. EOSINOPHILS: 0.1 10*3/uL (ref 0.0–0.4)
ABS. IMM. GRANS.: 0 10*3/uL (ref 0.0–0.1)
ABS. MONOCYTES: 0.9 10*3/uL (ref 0.1–0.9)
ABS. NEUTROPHILS: 5.6 10*3/uL (ref 1.4–7.0)
Abs Lymphocytes: 1.4 10*3/uL (ref 0.7–3.1)
BASOPHILS: 1 %
EOSINOPHILS: 1 %
HCT: 43.4 % (ref 34.0–46.6)
HGB: 14.7 g/dL (ref 11.1–15.9)
IMMATURE GRANULOCYTES: 0 %
Lymphocytes: 18 %
MCH: 31.5 pg (ref 26.6–33.0)
MCHC: 33.9 g/dL (ref 31.5–35.7)
MCV: 93 fL (ref 79–97)
MONOCYTES: 11 %
NEUTROPHILS: 69 %
PLATELET: 190 10*3/uL (ref 150–450)
RBC: 4.67 x10E6/uL (ref 3.77–5.28)
RDW: 12.3 % (ref 11.7–15.4)
WBC: 8.1 10*3/uL (ref 3.4–10.8)

## 2019-05-04 LAB — VITAMIN D, 25 HYDROXY: VITAMIN D, 25-HYDROXY: 68.3 ng/mL (ref 30.0–100.0)

## 2019-05-04 LAB — C REACTIVE PROTEIN, QT: C-Reactive Protein, Qt: <1 mg/L (ref 0–10)

## 2019-05-04 LAB — RHEUMATOID FACTOR, QL: Rheumatoid factor: <10 IU/mL (ref 0.0–13.9)

## 2019-05-04 LAB — SED RATE (ESR): Sed rate (ESR): 2 mm/hr (ref 0–40)

## 2019-08-21 ENCOUNTER — Telehealth

## 2019-08-21 MED ORDER — METOPROLOL SUCCINATE SR 100 MG 24 HR TAB
100 mg | ORAL_TABLET | Freq: Every day | ORAL | 3 refills | Status: AC
Start: 2019-08-21 — End: ?

## 2019-08-21 NOTE — Telephone Encounter (Signed)
Patient is out of her meds , requesting refills,  All her meds

## 2019-08-21 NOTE — Telephone Encounter (Signed)
Sent refills to pharmacy.

## 2019-09-16 ENCOUNTER — Encounter: Payer: MEDICARE | Attending: Family Medicine | Primary: Family Medicine

## 2019-10-31 ENCOUNTER — Encounter: Payer: MEDICARE | Attending: Family Medicine | Primary: Family Medicine

## 2019-11-07 ENCOUNTER — Encounter: Attending: Family Medicine | Primary: Family Medicine

## 2019-11-14 ENCOUNTER — Ambulatory Visit: Admit: 2019-11-14 | Discharge: 2019-11-14 | Payer: MEDICARE | Attending: Family Medicine | Primary: Family Medicine

## 2019-11-14 ENCOUNTER — Ambulatory Visit: Attending: Family Medicine | Primary: Family Medicine

## 2019-11-14 DIAGNOSIS — I10 Essential (primary) hypertension: Secondary | ICD-10-CM

## 2019-11-14 NOTE — Progress Notes (Signed)
HISTORY OF PRESENT ILLNESS  Melanie Houston is a 86 y.o. female.  Hypertension   The history is provided by the patient. This is a recurrent problem. The problem has not changed since onset.Pertinent negatives include no chest pain, no palpitations, no malaise/fatigue, no blurred vision, no headaches, no dizziness, no shortness of breath and no nausea.   Weight Loss  The history is provided by the patient and spouse. This is a recurrent problem. Pertinent negatives include no chest pain, no abdominal pain, no headaches and no shortness of breath.   Memory Loss   The history is provided by the patient. This is a recurrent problem. The problem has not changed since onset.Her past medical history is significant for hypertension.     Allergies   Allergen Reactions   ??? Nsaids (Non-Steroidal Anti-Inflammatory Drug) Other (comments)   ??? Azithromycin Unknown (comments)   ??? Celebrex [Celecoxib] Unknown (comments)   ??? Ibuprofen Unknown (comments)   ??? Penicillins Unknown (comments)   ??? Sulfa (Sulfonamide Antibiotics) Unknown (comments)   ??? Tetracyclines Unknown (comments)     Current Outpatient Medications   Medication Sig   ??? metoprolol succinate (TOPROL-XL) 100 mg tablet Take 1 Tab by mouth daily.   ??? cholecalciferol (VITAMIN D3) 1,000 unit cap Take  by mouth daily (with dinner).   ??? OTHER,NON-FORMULARY, Unknown eye drop for glaucoma   ??? dorzolamide-timolol (COSOPT) 22.3-6.8 mg/mL ophthalmic solution INSTILL 1 DROP INTO BOTH EYES TWICE A DAY   ??? latanoprost (XALATAN) 0.005 % ophthalmic solution INSTILL 1 DROP INTO BOTH EYES AT BEDTIME   ??? cholecalciferol, vitamin D3, 2,000 unit tab Take  by mouth.   ??? ascorbic acid (VITAMIN C) 500 mg tablet Strength: ; Form: ; SIG: take 1 tablet orally Daily     No current facility-administered medications for this visit.      Past Medical History:   Diagnosis Date   ??? Arthritis     osteo-arthritis   ??? Cancer ([place])     Bilateral Breast Cancer   ??? Chronic kidney disease     followed by Dr.  Stephens Shire   ??? Chronic UTI     Followed by Dr. Venia Minks, chronic macrobid   ??? Colon polyps    ??? Glaucoma    ??? Hematuria     followed by Dr. Irean Hong   ??? Hypertension    ??? Osteoporosis     followed by Dr. Debbra Riding   ??? Thyroid disease     low TSH followed by Dr. Janese Banks   ??? Vitamin D deficiency      Past Surgical History:   Procedure Laterality Date   ??? HX COLONOSCOPY  2006   ??? HX GYN      hysterectomy   ??? HX HEENT      tonsillectomy   ??? HX MASTECTOMY Bilateral 1988 and 2002   ??? PR COLONOSCOPY W/BIOPSY SINGLE/MULTIPLE  05/17/2017    polyp     Social History     Socioeconomic History   ??? Marital status: MARRIED     Spouse name: Not on file   ??? Number of children: Not on file   ??? Years of education: Not on file   ??? Highest education level: Not on file   Occupational History   ??? Not on file   Social Needs   ??? Financial resource strain: Not on file   ??? Food insecurity     Worry: Not on file     Inability: Not on file   ???  Transportation needs     Medical: Not on file     Non-medical: Not on file   Tobacco Use   ??? Smoking status: Never Smoker   ??? Smokeless tobacco: Never Used   Substance and Sexual Activity   ??? Alcohol use: No     Alcohol/week: 0.0 standard drinks   ??? Drug use: No   ??? Sexual activity: Not on file   Lifestyle   ??? Physical activity     Days per week: Not on file     Minutes per session: Not on file   ??? Stress: Not on file   Relationships   ??? Social Product manager on phone: Not on file     Gets together: Not on file     Attends religious service: Not on file     Active member of club or organization: Not on file     Attends meetings of clubs or organizations: Not on file     Relationship status: Not on file   ??? Intimate partner violence     Fear of current or ex partner: Not on file     Emotionally abused: Not on file     Physically abused: Not on file     Forced sexual activity: Not on file   Other Topics Concern   ??? Not on file   Social History Narrative   ??? Not on file       Review of Systems    Constitutional: Negative for chills, fever and malaise/fatigue.   HENT: Negative for congestion and hearing loss.    Eyes: Negative for blurred vision, pain and discharge.   Respiratory: Negative for cough, hemoptysis, shortness of breath and wheezing.    Cardiovascular: Negative for chest pain, palpitations and leg swelling.   Gastrointestinal: Negative for abdominal pain, blood in stool, constipation, diarrhea, heartburn and nausea.   Genitourinary: Negative for dysuria, frequency, hematuria and urgency.   Musculoskeletal: Negative for joint pain and myalgias.   Skin: Negative for rash.   Neurological: Negative for dizziness, sensory change and headaches.   Endo/Heme/Allergies: Negative for polydipsia. Does not bruise/bleed easily.   Psychiatric/Behavioral: Positive for memory loss. Negative for depression. The patient is not nervous/anxious and does not have insomnia.      Blood pressure (!) 150/80, pulse 74, temperature 97.7 ??F (36.5 ??C), temperature source Temporal, height 5' 3.25" (1.607 m), weight 84 lb 12.8 oz (38.5 kg), SpO2 97 %.      Physical Exam  Constitutional:       Appearance: She is well-developed.   Eyes:      Pupils: Pupils are equal, round, and reactive to light.   Neck:      Thyroid: No thyromegaly.      Vascular: No JVD.   Cardiovascular:      Rate and Rhythm: Normal rate and regular rhythm.      Heart sounds: Normal heart sounds. No murmur.   Pulmonary:      Effort: Pulmonary effort is normal. No respiratory distress.      Breath sounds: Normal breath sounds.   Abdominal:      General: Bowel sounds are normal.      Palpations: Abdomen is soft. There is no mass.      Tenderness: There is no abdominal tenderness.   Skin:     General: Skin is warm and dry.   Neurological:      Mental Status: She is alert and oriented to person, place,  and time.       No visits with results within 3 Month(s) from this visit.   Latest known visit with results is:   Office Visit on 05/02/2019   Component Date  Value Ref Range Status   ??? WBC 05/02/2019 8.1  3.4 - 10.8 x10E3/uL Final   ??? RBC 05/02/2019 4.67  3.77 - 5.28 x10E6/uL Final   ??? HGB 05/02/2019 14.7  11.1 - 15.9 g/dL Final   ??? HCT 05/02/2019 43.4  34.0 - 46.6 % Final   ??? MCV 05/02/2019 93  79 - 97 fL Final   ??? MCH 05/02/2019 31.5  26.6 - 33.0 pg Final   ??? MCHC 05/02/2019 33.9  31.5 - 35.7 g/dL Final   ??? RDW 05/02/2019 12.3  11.7 - 15.4 % Final   ??? PLATELET 05/02/2019 190  150 - 450 x10E3/uL Final   ??? NEUTROPHILS 05/02/2019 69  Not Estab. % Final   ??? Lymphocytes 05/02/2019 18  Not Estab. % Final   ??? MONOCYTES 05/02/2019 11  Not Estab. % Final   ??? EOSINOPHILS 05/02/2019 1  Not Estab. % Final   ??? BASOPHILS 05/02/2019 1  Not Estab. % Final   ??? ABS. NEUTROPHILS 05/02/2019 5.6  1.4 - 7.0 x10E3/uL Final   ??? Abs Lymphocytes 05/02/2019 1.4  0.7 - 3.1 x10E3/uL Final   ??? ABS. MONOCYTES 05/02/2019 0.9  0.1 - 0.9 x10E3/uL Final   ??? ABS. EOSINOPHILS 05/02/2019 0.1  0.0 - 0.4 x10E3/uL Final   ??? ABS. BASOPHILS 05/02/2019 0.1  0.0 - 0.2 x10E3/uL Final   ??? IMMATURE GRANULOCYTES 05/02/2019 0  Not Estab. % Final   ??? ABS. IMM. GRANS. 05/02/2019 0.0  0.0 - 0.1 x10E3/uL Final   ??? Glucose 05/02/2019 106* 65 - 99 mg/dL Final   ??? BUN 05/02/2019 25  8 - 27 mg/dL Final   ??? Creatinine 05/02/2019 0.98  0.57 - 1.00 mg/dL Final   ??? GFR est non-AA 05/02/2019 54* >59 mL/min/1.73 Final   ??? GFR est AA 05/02/2019 62  >59 mL/min/1.73 Final   ??? BUN/Creatinine ratio 05/02/2019 26  12 - 28 Final   ??? Sodium 05/02/2019 141  134 - 144 mmol/L Final   ??? Potassium 05/02/2019 4.0  3.5 - 5.2 mmol/L Final   ??? Chloride 05/02/2019 103  96 - 106 mmol/L Final   ??? CO2 05/02/2019 23  20 - 29 mmol/L Final   ??? Calcium 05/02/2019 9.7  8.7 - 10.3 mg/dL Final   ??? Protein, total 05/02/2019 6.7  6.0 - 8.5 g/dL Final   ??? Albumin 05/02/2019 4.4  3.6 - 4.6 g/dL Final   ??? GLOBULIN, TOTAL 05/02/2019 2.3  1.5 - 4.5 g/dL Final   ??? A-G Ratio 05/02/2019 1.9  1.2 - 2.2 Final   ??? Bilirubin, total 05/02/2019 0.7  0.0 - 1.2 mg/dL Final   ???  Alk. phosphatase 05/02/2019 64  39 - 117 IU/L Final   ??? AST (SGOT) 05/02/2019 22  0 - 40 IU/L Final   ??? ALT (SGPT) 05/02/2019 17  0 - 32 IU/L Final   ??? Cholesterol, total 05/02/2019 202* 100 - 199 mg/dL Final   ??? Triglyceride 05/02/2019 83  0 - 149 mg/dL Final   ??? HDL Cholesterol 05/02/2019 76  >39 mg/dL Final   ??? VLDL, calculated 05/02/2019 17  5 - 40 mg/dL Final   ??? LDL, calculated 05/02/2019 109* 0 - 99 mg/dL Final    Comment: **Effective May 06, 2019, [facility] is implementing an  improved**  equation to calculate Low Density Lipoprotein Cholesterol (LDL-C)  concentrations, to be used in all lipid panels that report calculated  LDL-C. This equation was developed through a collaboration with the  [employer], Lung and [place] (NIH).[1] The NIH  calculation overcomes the limitations of the existing [place]  LDL-C equation and performs equally well in both fasting and  non-fasting individuals.  1. Pauline Good Q, et al. A new equation for  calculation of low-density lipoprotein cholesterol in patients with  normolipidemia and/or hypertriglyceridemia. JAMA Cardiol.  2020 Feb 26. doi:[email or web address]     ??? LDL/HDL Ratio 05/02/2019 1.4  0.0 - 3.2 ratio Final    Comment:                                     LDL/HDL Ratio                                              Men  Women                                1/2 Avg.Risk  1.0    1.5                                    Avg.Risk  3.6    3.2                                 2X Avg.Risk  6.2    5.0                                 3X Avg.Risk  8.0    6.1     ??? TSH 05/02/2019 2.690  0.450 - 4.500 uIU/mL Final   ??? Color (UA POC) 05/02/2019 Yellow   Final   ??? Clarity (UA POC) 05/02/2019 Clear   Final   ??? Glucose (UA POC) 05/02/2019 Negative  Negative Final   ??? Bilirubin (UA POC) 05/02/2019 Negative  Negative Final   ??? Ketones (UA POC) 05/02/2019 Negative  Negative Final   ??? Specific gravity (UA POC) 05/02/2019 1.020  1.001 - 1.035  Final   ??? Blood (UA POC) 05/02/2019 Negative  Negative Final   ??? pH (UA POC) 05/02/2019 6.0  4.6 - 8.0 Final   ??? Protein (UA POC) 05/02/2019 Trace  Negative Final   ??? Urobilinogen (UA POC) 05/02/2019 normal  0.2 - 1 Final   ??? Nitrites (UA POC) 05/02/2019 Negative  Negative Final   ??? Leukocyte esterase (UA POC) 05/02/2019 Negative  Negative Final   ??? Vitamin B12 05/02/2019 568  232 - 1,245 pg/mL Final   ??? VITAMIN D, 25-HYDROXY 05/02/2019 68.3  30.0 - 100.0 ng/mL Final    Comment: Vitamin D deficiency has been defined by the Institute of  Medicine and an Endocrine Society practice guideline as a  level of serum 25-OH vitamin D less than 20 ng/mL (1,2).  The Endocrine Society went on to further define vitamin D  insufficiency as  a level between 21 and 29 ng/mL (2).  1. IOM (Institute of Medicine). 2010. Dietary reference     intakes for calcium and D. [place]: The     [employer].  2. Holick MF, Binkley NC, Bischoff-Ferrari HA, et al.     Evaluation, treatment, and prevention of vitamin D     deficiency: an Endocrine Society clinical practice     guideline. JCEM. 2011 Jul; 96(7):1911-30.     ??? RPR 05/02/2019 Non Reactive  Non Reactive Final   ??? Antinuclear Abs, IFA 05/02/2019 Negative   Final    Comment:                                      Negative   <1:80                                       Borderline  1:80                                       Positive   >1:80     ??? Sed rate (ESR) 05/02/2019 2  0 - 40 mm/hr Final   ??? Uric acid 05/02/2019 3.8  2.5 - 7.1 mg/dL Final               Therapeutic target for gout patients: <6.0   ??? Anti-streptolysin O Ab 05/02/2019 64.0  0.0 - 200.0 IU/mL Final   ??? C-Reactive Protein, Qt 05/02/2019 <1  0 - 10 mg/L Final   ??? Rheumatoid factor 05/02/2019 <10.0  0.0 - 13.9 IU/mL Final       ASSESSMENT and PLAN    ICD-10-CM ICD-9-CM    1. Essential hypertension  I10 401.9    2. Weight loss  R63.4 783.21    3. Memory loss, short term  R41.3 780.93      Diagnoses and all  orders for this visit:    1. Essential hypertension    2. Weight loss    3. Memory loss, short term      current treatment plan is effective, no change in therapy

## 2019-11-14 NOTE — Progress Notes (Signed)
HISTORY OF PRESENT ILLNESS  Melanie Houston is a 86 y.o. female.  Hypertension   The history is provided by the patient. This is a recurrent problem. The problem has not changed since onset.Pertinent negatives include no chest pain, no palpitations, no malaise/fatigue, no blurred vision, no headaches, no dizziness, no shortness of breath and no nausea.   Weight Loss  The history is provided by the patient and spouse. This is a recurrent problem. Pertinent negatives include no chest pain, no abdominal pain, no headaches and no shortness of breath.   Memory Loss   The history is provided by the patient. This is a recurrent problem. The problem has not changed since onset.Her past medical history is significant for hypertension.     Allergies   Allergen Reactions   ??? Nsaids (Non-Steroidal Anti-Inflammatory Drug) Other (comments)   ??? Azithromycin Unknown (comments)   ??? Celebrex [Celecoxib] Unknown (comments)   ??? Ibuprofen Unknown (comments)   ??? Penicillins Unknown (comments)   ??? Sulfa (Sulfonamide Antibiotics) Unknown (comments)   ??? Tetracyclines Unknown (comments)     Current Outpatient Medications   Medication Sig   ??? metoprolol succinate (TOPROL-XL) 100 mg tablet Take 1 Tab by mouth daily.   ??? cholecalciferol (VITAMIN D3) 1,000 unit cap Take  by mouth daily (with dinner).   ??? OTHER,NON-FORMULARY, Unknown eye drop for glaucoma   ??? dorzolamide-timolol (COSOPT) 22.3-6.8 mg/mL ophthalmic solution INSTILL 1 DROP INTO BOTH EYES TWICE A DAY   ??? latanoprost (XALATAN) 0.005 % ophthalmic solution INSTILL 1 DROP INTO BOTH EYES AT BEDTIME   ??? cholecalciferol, vitamin D3, 2,000 unit tab Take  by mouth.   ??? ascorbic acid (VITAMIN C) 500 mg tablet Strength: ; Form: ; SIG: take 1 tablet orally Daily     No current facility-administered medications for this visit.      Past Medical History:   Diagnosis Date   ??? Arthritis     osteo-arthritis   ??? Cancer ([place])     Bilateral Breast Cancer   ??? Chronic kidney disease     followed by Dr.  Stephens Shire   ??? Chronic UTI     Followed by Dr. Venia Minks, chronic macrobid   ??? Colon polyps    ??? Glaucoma    ??? Hematuria     followed by Dr. Irean Hong   ??? Hypertension    ??? Osteoporosis     followed by Dr. Debbra Riding   ??? Thyroid disease     low TSH followed by Dr. Janese Banks   ??? Vitamin D deficiency      Past Surgical History:   Procedure Laterality Date   ??? HX COLONOSCOPY  2006   ??? HX GYN      hysterectomy   ??? HX HEENT      tonsillectomy   ??? HX MASTECTOMY Bilateral 1988 and 2002   ??? PR COLONOSCOPY W/BIOPSY SINGLE/MULTIPLE  05/17/2017    polyp     Social History     Socioeconomic History   ??? Marital status: MARRIED     Spouse name: Not on file   ??? Number of children: Not on file   ??? Years of education: Not on file   ??? Highest education level: Not on file   Occupational History   ??? Not on file   Social Needs   ??? Financial resource strain: Not on file   ??? Food insecurity     Worry: Not on file     Inability: Not on file   ???  Transportation needs     Medical: Not on file     Non-medical: Not on file   Tobacco Use   ??? Smoking status: Never Smoker   ??? Smokeless tobacco: Never Used   Substance and Sexual Activity   ??? Alcohol use: No     Alcohol/week: 0.0 standard drinks   ??? Drug use: No   ??? Sexual activity: Not on file   Lifestyle   ??? Physical activity     Days per week: Not on file     Minutes per session: Not on file   ??? Stress: Not on file   Relationships   ??? Social Product manager on phone: Not on file     Gets together: Not on file     Attends religious service: Not on file     Active member of club or organization: Not on file     Attends meetings of clubs or organizations: Not on file     Relationship status: Not on file   ??? Intimate partner violence     Fear of current or ex partner: Not on file     Emotionally abused: Not on file     Physically abused: Not on file     Forced sexual activity: Not on file   Other Topics Concern   ??? Not on file   Social History Narrative   ??? Not on file       Review of Systems    Constitutional: Negative for chills, fever and malaise/fatigue.   HENT: Negative for congestion and hearing loss.    Eyes: Negative for blurred vision, pain and discharge.   Respiratory: Negative for cough, hemoptysis, shortness of breath and wheezing.    Cardiovascular: Negative for chest pain, palpitations and leg swelling.   Gastrointestinal: Negative for abdominal pain, blood in stool, constipation, diarrhea, heartburn and nausea.   Genitourinary: Negative for dysuria, frequency, hematuria and urgency.   Musculoskeletal: Negative for joint pain and myalgias.   Skin: Negative for rash.   Neurological: Negative for dizziness, sensory change and headaches.   Endo/Heme/Allergies: Negative for polydipsia. Does not bruise/bleed easily.   Psychiatric/Behavioral: Positive for memory loss. Negative for depression. The patient is not nervous/anxious and does not have insomnia.      Blood pressure (!) 150/80, pulse 74, temperature 97.7 ??F (36.5 ??C), temperature source Temporal, height 5' 3.25" (1.607 m), weight 84 lb 12.8 oz (38.5 kg), SpO2 97 %.      Physical Exam  Constitutional:       Appearance: She is well-developed.   Eyes:      Pupils: Pupils are equal, round, and reactive to light.   Neck:      Thyroid: No thyromegaly.      Vascular: No JVD.   Cardiovascular:      Rate and Rhythm: Normal rate and regular rhythm.      Heart sounds: Normal heart sounds. No murmur.   Pulmonary:      Effort: Pulmonary effort is normal. No respiratory distress.      Breath sounds: Normal breath sounds.   Abdominal:      General: Bowel sounds are normal.      Palpations: Abdomen is soft. There is no mass.      Tenderness: There is no abdominal tenderness.   Skin:     General: Skin is warm and dry.   Neurological:      Mental Status: She is alert and oriented to person, place,  and time.       No visits with results within 3 Month(s) from this visit.   Latest known visit with results is:   Office Visit on 05/02/2019   Component Date Value  Ref Range Status   ??? WBC 05/02/2019 8.1  3.4 - 10.8 x10E3/uL Final   ??? RBC 05/02/2019 4.67  3.77 - 5.28 x10E6/uL Final   ??? HGB 05/02/2019 14.7  11.1 - 15.9 g/dL Final   ??? HCT 05/02/2019 43.4  34.0 - 46.6 % Final   ??? MCV 05/02/2019 93  79 - 97 fL Final   ??? MCH 05/02/2019 31.5  26.6 - 33.0 pg Final   ??? MCHC 05/02/2019 33.9  31.5 - 35.7 g/dL Final   ??? RDW 05/02/2019 12.3  11.7 - 15.4 % Final   ??? PLATELET 05/02/2019 190  150 - 450 x10E3/uL Final   ??? NEUTROPHILS 05/02/2019 69  Not Estab. % Final   ??? Lymphocytes 05/02/2019 18  Not Estab. % Final   ??? MONOCYTES 05/02/2019 11  Not Estab. % Final   ??? EOSINOPHILS 05/02/2019 1  Not Estab. % Final   ??? BASOPHILS 05/02/2019 1  Not Estab. % Final   ??? ABS. NEUTROPHILS 05/02/2019 5.6  1.4 - 7.0 x10E3/uL Final   ??? Abs Lymphocytes 05/02/2019 1.4  0.7 - 3.1 x10E3/uL Final   ??? ABS. MONOCYTES 05/02/2019 0.9  0.1 - 0.9 x10E3/uL Final   ??? ABS. EOSINOPHILS 05/02/2019 0.1  0.0 - 0.4 x10E3/uL Final   ??? ABS. BASOPHILS 05/02/2019 0.1  0.0 - 0.2 x10E3/uL Final   ??? IMMATURE GRANULOCYTES 05/02/2019 0  Not Estab. % Final   ??? ABS. IMM. GRANS. 05/02/2019 0.0  0.0 - 0.1 x10E3/uL Final   ??? Glucose 05/02/2019 106* 65 - 99 mg/dL Final   ??? BUN 05/02/2019 25  8 - 27 mg/dL Final   ??? Creatinine 05/02/2019 0.98  0.57 - 1.00 mg/dL Final   ??? GFR est non-AA 05/02/2019 54* >59 mL/min/1.73 Final   ??? GFR est AA 05/02/2019 62  >59 mL/min/1.73 Final   ??? BUN/Creatinine ratio 05/02/2019 26  12 - 28 Final   ??? Sodium 05/02/2019 141  134 - 144 mmol/L Final   ??? Potassium 05/02/2019 4.0  3.5 - 5.2 mmol/L Final   ??? Chloride 05/02/2019 103  96 - 106 mmol/L Final   ??? CO2 05/02/2019 23  20 - 29 mmol/L Final   ??? Calcium 05/02/2019 9.7  8.7 - 10.3 mg/dL Final   ??? Protein, total 05/02/2019 6.7  6.0 - 8.5 g/dL Final   ??? Albumin 05/02/2019 4.4  3.6 - 4.6 g/dL Final   ??? GLOBULIN, TOTAL 05/02/2019 2.3  1.5 - 4.5 g/dL Final   ??? A-G Ratio 05/02/2019 1.9  1.2 - 2.2 Final   ??? Bilirubin, total 05/02/2019 0.7  0.0 - 1.2 mg/dL Final   ??? Alk.  phosphatase 05/02/2019 64  39 - 117 IU/L Final   ??? AST (SGOT) 05/02/2019 22  0 - 40 IU/L Final   ??? ALT (SGPT) 05/02/2019 17  0 - 32 IU/L Final   ??? Cholesterol, total 05/02/2019 202* 100 - 199 mg/dL Final   ??? Triglyceride 05/02/2019 83  0 - 149 mg/dL Final   ??? HDL Cholesterol 05/02/2019 76  >39 mg/dL Final   ??? VLDL, calculated 05/02/2019 17  5 - 40 mg/dL Final   ??? LDL, calculated 05/02/2019 109* 0 - 99 mg/dL Final    Comment: **Effective May 06, 2019, [facility] is implementing an  improved**  equation to calculate Low Density Lipoprotein Cholesterol (LDL-C)  concentrations, to be used in all lipid panels that report calculated  LDL-C. This equation was developed through a collaboration with the  [employer], [facility] and [facility] (NIH).[1] The NIH  calculation overcomes the limitations of the existing Friedewald  LDL-C equation and performs equally well in both fasting and  non-fasting individuals.  1. Pauline Good Q, et al. A new equation for  calculation of low-density lipoprotein cholesterol in patients with  normolipidemia and/or hypertriglyceridemia. JAMA Cardiol.  2020 Feb 26. doi:10.1001/jamacardio.2020.0013     ??? LDL/HDL Ratio 05/02/2019 1.4  0.0 - 3.2 ratio Final    Comment:                                     LDL/HDL Ratio                                              Men  Women                                1/2 Avg.Risk  1.0    1.5                                    Avg.Risk  3.6    3.2                                 2X Avg.Risk  6.2    5.0                                 3X Avg.Risk  8.0    6.1     ??? TSH 05/02/2019 2.690  0.450 - 4.500 uIU/mL Final   ??? Color (UA POC) 05/02/2019 Yellow   Final   ??? Clarity (UA POC) 05/02/2019 Clear   Final   ??? Glucose (UA POC) 05/02/2019 Negative  Negative Final   ??? Bilirubin (UA POC) 05/02/2019 Negative  Negative Final   ??? Ketones (UA POC) 05/02/2019 Negative  Negative Final   ??? Specific gravity (UA POC) 05/02/2019 1.020  1.001 - 1.035 Final   ???  Blood (UA POC) 05/02/2019 Negative  Negative Final   ??? pH (UA POC) 05/02/2019 6.0  4.6 - 8.0 Final   ??? Protein (UA POC) 05/02/2019 Trace  Negative Final   ??? Urobilinogen (UA POC) 05/02/2019 normal  0.2 - 1 Final   ??? Nitrites (UA POC) 05/02/2019 Negative  Negative Final   ??? Leukocyte esterase (UA POC) 05/02/2019 Negative  Negative Final   ??? Vitamin B12 05/02/2019 568  232 - 1,245 pg/mL Final   ??? VITAMIN D, 25-HYDROXY 05/02/2019 68.3  30.0 - 100.0 ng/mL Final    Comment: Vitamin D deficiency has been defined by the Institute of  Medicine and an Endocrine Society practice guideline as a  level of serum 25-OH vitamin D less than 20 ng/mL (1,2).  The Endocrine Society went on to further define vitamin D  insufficiency as  a level between 21 and 29 ng/mL (2).  1. IOM (Institute of Medicine). 2010. Dietary reference     intakes for calcium and D. [place]: The     [employer].  2. Holick MF, Binkley NC, Bischoff-Ferrari HA, et al.     Evaluation, treatment, and prevention of vitamin D     deficiency: an Endocrine Society clinical practice     guideline. JCEM. 2011 Jul; 96(7):1911-30.     ??? RPR 05/02/2019 Non Reactive  Non Reactive Final   ??? Antinuclear Abs, IFA 05/02/2019 Negative   Final    Comment:                                      Negative   <1:80                                       Borderline  1:80                                       Positive   >1:80     ??? Sed rate (ESR) 05/02/2019 2  0 - 40 mm/hr Final   ??? Uric acid 05/02/2019 3.8  2.5 - 7.1 mg/dL Final               Therapeutic target for gout patients: <6.0   ??? Anti-streptolysin O Ab 05/02/2019 64.0  0.0 - 200.0 IU/mL Final   ??? C-Reactive Protein, Qt 05/02/2019 <1  0 - 10 mg/L Final   ??? Rheumatoid factor 05/02/2019 <10.0  0.0 - 13.9 IU/mL Final       ASSESSMENT and PLAN    ICD-10-CM ICD-9-CM    1. Essential hypertension  I10 401.9    2. Weight loss  R63.4 783.21    3. Memory loss, short term  R41.3 780.93      Diagnoses and all orders for  this visit:    1. Essential hypertension    2. Weight loss    3. Memory loss, short term      current treatment plan is effective, no change in therapy

## 2020-01-04 DIAGNOSIS — Z9289 Personal history of other medical treatment: Secondary | ICD-10-CM

## 2020-01-04 HISTORY — DX: Personal history of other medical treatment: Z92.89

## 2020-01-14 NOTE — Telephone Encounter (Signed)
Patients daughter called in due to pt acting strange, asking odd questions, confusion. Call was directed to the nurse and appt was made by me for first avail which is 01/17/20

## 2020-01-15 ENCOUNTER — Encounter: Payer: MEDICARE | Attending: Family | Primary: Family Medicine

## 2020-01-15 ENCOUNTER — Inpatient Hospital Stay
Admit: 2020-01-15 | Discharge: 2020-01-16 | Disposition: A | Discharge status: Home / Self Care | Payer: MEDICARE | Attending: Emergency Medicine

## 2020-01-15 ENCOUNTER — Emergency Department: Admit: 2020-01-15 | Payer: MEDICARE | Primary: Family Medicine

## 2020-01-15 DIAGNOSIS — R413 Other amnesia: Secondary | ICD-10-CM

## 2020-01-15 LAB — CBC WITH AUTO DIFFERENTIAL
Basophils %: 1 % (ref 0.0–2.0)
Basophils Absolute: 0.1 10*3/uL (ref 0.0–0.2)
Eosinophils %: 0 % — ABNORMAL LOW (ref 0.5–7.8)
Eosinophils Absolute: 0 10*3/uL (ref 0.0–0.8)
Granulocyte Absolute Count: 0 10*3/uL (ref 0.0–0.5)
Hematocrit: 48.1 % — ABNORMAL HIGH (ref 35.8–46.3)
Hemoglobin: 16.1 g/dL — ABNORMAL HIGH (ref 11.7–15.4)
Immature Granulocytes: 0 % (ref 0.0–5.0)
Lymphocytes %: 18 % (ref 13–44)
Lymphocytes Absolute: 1.7 10*3/uL (ref 0.5–4.6)
MCH: 31.3 PG (ref 26.1–32.9)
MCHC: 33.5 g/dL (ref 31.4–35.0)
MCV: 93.6 FL (ref 79.6–97.8)
MPV: 10.5 FL (ref 9.4–12.3)
Monocytes %: 12 % (ref 4.0–12.0)
Monocytes Absolute: 1.1 10*3/uL (ref 0.1–1.3)
NRBC Absolute: 0 10*3/uL (ref 0.0–0.2)
Neutrophils %: 69 % (ref 43–78)
Neutrophils Absolute: 6.5 10*3/uL (ref 1.7–8.2)
Platelets: 255 10*3/uL (ref 150–450)
RBC: 5.14 M/uL (ref 4.05–5.2)
RDW: 12.6 % (ref 11.9–14.6)
WBC: 9.4 10*3/uL (ref 4.3–11.1)

## 2020-01-15 LAB — COMPREHENSIVE METABOLIC PANEL
ALT: 18 U/L (ref 12–65)
AST: 18 U/L (ref 15–37)
Albumin/Globulin Ratio: 1 — ABNORMAL LOW (ref 1.2–3.5)
Albumin: 4.3 (ref 3.5–5.0)
Albumin: 4.3 g/dL (ref 3.2–4.6)
Alkaline Phosphatase: 89 U/L (ref 50–130)
Anion Gap: 7 mmol/L (ref 7–16)
BUN: 28 MG/DL — ABNORMAL HIGH (ref 8–23)
CO2: 29 mmol/L (ref 21–32)
Calcium: 10 (ref 8.7–10.7)
Calcium: 10 MG/DL (ref 8.3–10.4)
Chloride: 105 mmol/L (ref 98–107)
Creatinine: 0.88 MG/DL (ref 0.6–1.0)
EGFR IF NonAfrican American: >60 mL/min/{1.73_m2} (ref >60)
GFR African American: >60 mL/min/{1.73_m2} (ref >60)
GFR calc Af Amer: >60
GFR calc non Af Amer: >60
Globulin: 4.4
Globulin: 4.4 g/dL — ABNORMAL HIGH (ref 2.3–3.5)
Glucose: 92 mg/dL (ref 65–100)
Potassium: 3.4 mmol/L — ABNORMAL LOW (ref 3.5–5.1)
Sodium: 141 mmol/L (ref 136–145)
Total Bilirubin: 0.6 MG/DL (ref 0.2–1.1)
Total Protein: 8.7 g/dL — ABNORMAL HIGH (ref 6.3–8.2)

## 2020-01-15 LAB — EKG 12-LEAD
Atrial Rate: 82 {beats}/min
Diagnosis: NORMAL
P Axis: 83 degrees
P-R Interval: 162 ms
Q-T Interval: 380 ms
QRS Duration: 84 ms
QTc Calculation (Bazett): 443 ms
R Axis: 35 degrees
T Axis: 75 degrees
Ventricular Rate: 82 {beats}/min

## 2020-01-15 LAB — POCT GLUCOSE: POC Glucose: 94 mg/dL (ref 65–100)

## 2020-01-15 LAB — LACTIC ACID
Lactic Acid: 0.9 MMOL/L (ref 0.4–2.0)
Lactic acid: 0.9 MMOL/L (ref 0.4–2.0)

## 2020-01-15 LAB — CBC WITH AUTOMATED DIFF
ABS. BASOPHILS: 0.1 10*3/uL (ref 0.0–0.2)
ABS. EOSINOPHILS: 0 10*3/uL (ref 0.0–0.8)
ABS. IMM. GRANS.: 0 10*3/uL (ref 0.0–0.5)
ABS. LYMPHOCYTES: 1.7 10*3/uL (ref 0.5–4.6)
ABS. MONOCYTES: 1.1 10*3/uL (ref 0.1–1.3)
ABS. NEUTROPHILS: 6.5 10*3/uL (ref 1.7–8.2)
ABSOLUTE NRBC: 0 10*3/uL (ref 0.0–0.2)
BASOPHILS: 1 % (ref 0.0–2.0)
EOSINOPHILS: 0 % — ABNORMAL LOW (ref 0.5–7.8)
HCT: 48.1 % — ABNORMAL HIGH (ref 35.8–46.3)
HGB: 16.1 g/dL — ABNORMAL HIGH (ref 11.7–15.4)
IMMATURE GRANULOCYTES: 0 % (ref 0.0–5.0)
LYMPHOCYTES: 18 % (ref 13–44)
MCH: 31.3 PG (ref 26.1–32.9)
MCHC: 33.5 g/dL (ref 31.4–35.0)
MCV: 93.6 FL (ref 79.6–97.8)
MONOCYTES: 12 % (ref 4.0–12.0)
MPV: 10.5 FL (ref 9.4–12.3)
NEUTROPHILS: 69 % (ref 43–78)
PLATELET: 255 10*3/uL (ref 150–450)
RBC: 5.14 M/uL (ref 4.05–5.2)
RDW: 12.6 % (ref 11.9–14.6)
WBC: 9.4 10*3/uL (ref 4.3–11.1)

## 2020-01-15 LAB — METABOLIC PANEL, COMPREHENSIVE
A-G Ratio: 1 — ABNORMAL LOW (ref 1.2–3.5)
ALT (SGPT): 18 U/L (ref 12–65)
AST (SGOT): 18 U/L (ref 15–37)
Albumin: 4.3 g/dL (ref 3.2–4.6)
Alk. phosphatase: 89 U/L (ref 50–130)
Anion gap: 7 mmol/L (ref 7–16)
BUN: 28 MG/DL — ABNORMAL HIGH (ref 8–23)
Bilirubin, total: 0.6 MG/DL (ref 0.2–1.1)
CO2: 29 mmol/L (ref 21–32)
Calcium: 10 MG/DL (ref 8.3–10.4)
Chloride: 105 mmol/L (ref 98–107)
Creatinine: 0.88 MG/DL (ref 0.6–1.0)
GFR est AA: >60 mL/min/{1.73_m2} (ref >60)
GFR est non-AA: >60 mL/min/{1.73_m2} (ref >60)
Globulin: 4.4 g/dL — ABNORMAL HIGH (ref 2.3–3.5)
Glucose: 92 mg/dL (ref 65–100)
Potassium: 3.4 mmol/L — ABNORMAL LOW (ref 3.5–5.1)
Protein, total: 8.7 g/dL — ABNORMAL HIGH (ref 6.3–8.2)
Sodium: 141 mmol/L (ref 136–145)

## 2020-01-15 LAB — EKG, 12 LEAD, INITIAL
Atrial Rate: 82 {beats}/min
Calculated P Axis: 83 degrees
Calculated R Axis: 35 degrees
Calculated T Axis: 75 degrees
Diagnosis: NORMAL
P-R Interval: 162 ms
Q-T Interval: 380 ms
QRS Duration: 84 ms
QTC Calculation (Bezet): 443 ms
Ventricular Rate: 82 {beats}/min

## 2020-01-15 LAB — GLUCOSE, POC: Glucose (POC): 94 mg/dL (ref 65–100)

## 2020-01-15 LAB — BASIC METABOLIC PANEL
BUN: 28 — AB (ref 4–21)
CO2: 29 — AB (ref 13–22)
Chloride: 105 (ref 99–108)
Creatinine: 0.9 (ref 0.5–1.1)
Glucose: 92
Potassium: 3.4 (ref 3.4–5.3)

## 2020-01-15 LAB — CBC AND DIFFERENTIAL
HCT: 48 — AB (ref 36–46)
Hemoglobin: 16.1 — AB (ref 12.0–16.0)
Neutrophils Absolute: 69
Platelets: 255 (ref 150–399)
WBC: 9.4

## 2020-01-15 LAB — HEPATIC FUNCTION PANEL
ALT: 18 (ref 7–35)
AST: 18 (ref 13–35)
Alkaline Phosphatase: 89 (ref 25–125)
Bilirubin, Total: 0.6

## 2020-01-15 LAB — CBC: RBC: 5.14 — AB (ref 3.87–5.11)

## 2020-01-15 MED ORDER — SODIUM CHLORIDE 0.9% BOLUS IV
0.9 % | Freq: Once | INTRAVENOUS | Status: AC
Start: 2020-01-15 — End: 2020-01-15
  Administered 2020-01-15: 22:00:00 via INTRAVENOUS

## 2020-01-15 NOTE — ED Triage Notes (Signed)
Pt daughter states that she has had AMS recently, saying things that do not make sense. States they were supposed to follow up with PCP Friday. AMS is baseline has gotten  worse over past day. Per daughter, mom did not want to go get lab work done so PCP said they should come to ED. Suspect possible UTI. Mask applied to pt.

## 2020-01-15 NOTE — ED Notes (Cosign Needed)
Chart accessed as [facility] had not received script for keflex.  I gave him verbal order for what dr bourdon had ordered.

## 2020-01-15 NOTE — ED Provider Notes (Signed)
Per nurse's notes: "Pt daughter states that she has had AMS recently, saying things that do not make sense. States they were supposed to follow up with PCP Friday. AMS is baseline has gotten  worse over past day. Per daughter, mom did not want to go get lab work done so PCP said they should come to ED. Suspect possible UTI. Mask applied to pt. "    Historian is actually not the patient's daughter but her neighbor and good friend.  Patient lives at home with her husband who is in the lobby.    The history is provided by the patient.   Altered mental status   This is a new problem. The current episode started more than 2 days ago. The problem has been gradually worsening. Pertinent negatives include no confusion, no somnolence, no seizures, no unresponsiveness, no weakness, no agitation, no delusions, no hallucinations, no self-injury, no violence, no tingling and no numbness. Mental status baseline is normal.  Risk factors: Short-term memory issues. Her past medical history is significant for hypertension and dementia. Her past medical history does not include diabetes, seizures, liver disease, CVA, TIA, AIDS, COPD, depression, psychotropic medication treatment, head trauma or heart disease.        Past Medical History:   Diagnosis Date   ??? Arthritis     osteo-arthritis   ??? Cancer ([place])     Bilateral Breast Cancer   ??? Chronic kidney disease     followed by Dr. Stephens Shire   ??? Chronic UTI     Followed by Dr. Venia Minks, chronic macrobid   ??? Colon polyps    ??? Glaucoma    ??? Hematuria     followed by Dr. Irean Hong   ??? Hypertension    ??? Osteoporosis     followed by Dr. Debbra Riding   ??? Thyroid disease     low TSH followed by Dr. Janese Banks   ??? Vitamin D deficiency        Past Surgical History:   Procedure Laterality Date   ??? HX COLONOSCOPY  2006   ??? HX GYN      hysterectomy   ??? HX HEENT      tonsillectomy   ??? HX MASTECTOMY Bilateral 1988 and 2002   ??? PR COLONOSCOPY W/BIOPSY SINGLE/MULTIPLE  05/17/2017    polyp         Family History:    Problem Relation Age of Onset   ??? Cancer Mother    ??? Heart Disease Father        Social History     Socioeconomic History   ??? Marital status: MARRIED     Spouse name: Not on file   ??? Number of children: Not on file   ??? Years of education: Not on file   ??? Highest education level: Not on file   Occupational History   ??? Not on file   Social Needs   ??? Financial resource strain: Not on file   ??? Food insecurity     Worry: Not on file     Inability: Not on file   ??? Transportation needs     Medical: Not on file     Non-medical: Not on file   Tobacco Use   ??? Smoking status: Never Smoker   ??? Smokeless tobacco: Never Used   Substance and Sexual Activity   ??? Alcohol use: No     Alcohol/week: 0.0 standard drinks   ??? Drug use: No   ??? Sexual activity: Not on  file   Lifestyle   ??? Physical activity     Days per week: Not on file     Minutes per session: Not on file   ??? Stress: Not on file   Relationships   ??? Social Product manager on phone: Not on file     Gets together: Not on file     Attends religious service: Not on file     Active member of club or organization: Not on file     Attends meetings of clubs or organizations: Not on file     Relationship status: Not on file   ??? Intimate partner violence     Fear of current or ex partner: Not on file     Emotionally abused: Not on file     Physically abused: Not on file     Forced sexual activity: Not on file   Other Topics Concern   ??? Not on file   Social History Narrative   ??? Not on file         ALLERGIES: Nsaids (non-steroidal anti-inflammatory drug), Azithromycin, Celebrex [celecoxib], Ibuprofen, Penicillins, Sulfa (sulfonamide antibiotics), and Tetracyclines    Review of Systems   Constitutional: Negative for chills and fever.   Neurological: Negative for dizziness, tingling, seizures, weakness, numbness and headaches.   Psychiatric/Behavioral: Negative for agitation, confusion, hallucinations and self-injury.   All other systems reviewed and are negative.      Vitals:     01/15/20 1728   BP: (!) 168/83   Pulse: 89   Resp: 18   SpO2: 99%            Physical Exam  Vitals signs and nursing note reviewed.   Constitutional:       General: She is not in acute distress.     Appearance: She is well-developed and underweight.   HENT:      Head: Normocephalic and atraumatic.      Right Ear: External ear normal.      Left Ear: External ear normal.   Eyes:      Extraocular Movements: Extraocular movements intact.      Conjunctiva/sclera: Conjunctivae normal.      Pupils: Pupils are equal, round, and reactive to light.   Neck:      Musculoskeletal: Normal range of motion and neck supple.   Cardiovascular:      Rate and Rhythm: Normal rate and regular rhythm.      Heart sounds: Normal heart sounds.   Pulmonary:      Effort: Pulmonary effort is normal.      Breath sounds: Normal breath sounds. No wheezing, rhonchi or rales.   Abdominal:      General: Bowel sounds are normal.      Palpations: Abdomen is soft.      Tenderness: There is no abdominal tenderness. There is no right CVA tenderness or left CVA tenderness.   Musculoskeletal: Normal range of motion.      Right lower leg: No edema.      Left lower leg: No edema.   Skin:     General: Skin is warm and dry.      Capillary Refill: Capillary refill takes less than 2 seconds.   Neurological:      Mental Status: She is alert. Mental status is at baseline.      Cranial Nerves: No cranial nerve deficit.      Sensory: No sensory deficit.      Comments: Patient cannot  tell me the date/year or who the president is, and is somewhat confused with where she is now.   Psychiatric:         Mood and Affect: Mood normal.          MDM  Number of Diagnoses or Management Options  Abnormal chest x-ray: new and requires workup  Dehydration: new and requires workup  Short-term memory loss: new and requires workup     Amount and/or Complexity of Data Reviewed  Clinical lab tests: ordered and reviewed  Tests in the radiology section of CPT??: ordered and  reviewed  Tests in the medicine section of CPT??: ordered and reviewed (EKG interpretation for EKG dated 12 May 21 at 1811: EKG reveals normal sinus rhythm at a rate of 82 bpm, biatrial enlargement, LVH, possible old septal infarct. Abnormal EKG.   Daron Offer, MD  )  Obtain history from someone other than the patient: yes  Review and summarize past medical records: yes    Risk of Complications, Morbidity, and/or Mortality  Presenting problems: moderate  Diagnostic procedures: moderate  Management options: moderate    Patient Progress  Patient progress: stable         Procedures    The patient was observed in the ED. patient has had no cough, fever, chills or shortness of breath.  We will treat the abnormality on chest x-ray with antibiotics for 1 week, and choice of antibiotic was Keflex to the multiple allergies the patient.  Radiology review recommends repeat chest x-ray in 3 to 4 weeks.    Results Reviewed:      Recent Results (from the past 24 hour(s))   EKG, 12 LEAD, INITIAL    Collection Time: 01/15/20  6:11 PM   Result Value Ref Range    Ventricular Rate 82 BPM    Atrial Rate 82 BPM    P-R Interval 162 ms    QRS Duration 84 ms    Q-T Interval 380 ms    QTC Calculation (Bezet) 443 ms    Calculated P Axis 83 degrees    Calculated R Axis 35 degrees    Calculated T Axis 75 degrees    Diagnosis       Normal sinus rhythm  Biatrial enlargement  Left ventricular hypertrophy  Anteroseptal infarct , age undetermined  Abnormal ECG  No previous ECGs available  Confirmed by GRABARCZYK  MD ([facility]), MARK A ([id number]) on 01/15/2020 6:45:08 PM     CBC WITH AUTOMATED DIFF    Collection Time: 01/15/20  6:12 PM   Result Value Ref Range    WBC 9.4 4.3 - 11.1 K/uL    RBC 5.14 4.05 - 5.2 M/uL    HGB 16.1 (H) 11.7 - 15.4 g/dL    HCT 48.1 (H) 35.8 - 46.3 %    MCV 93.6 79.6 - 97.8 FL    MCH 31.3 26.1 - 32.9 PG    MCHC 33.5 31.4 - 35.0 g/dL    RDW 12.6 11.9 - 14.6 %    PLATELET 255 150 - 450 K/uL    MPV 10.5 9.4 - 12.3 FL    ABSOLUTE NRBC  0.00 0.0 - 0.2 K/uL    NEUTROPHILS 69 43 - 78 %    LYMPHOCYTES 18 13 - 44 %    MONOCYTES 12 4.0 - 12.0 %    EOSINOPHILS 0 (L) 0.5 - 7.8 %    BASOPHILS 1 0.0 - 2.0 %    IMMATURE GRANULOCYTES 0 0.0 -  5.0 %    ABS. NEUTROPHILS 6.5 1.7 - 8.2 K/UL    ABS. LYMPHOCYTES 1.7 0.5 - 4.6 K/UL    ABS. MONOCYTES 1.1 0.1 - 1.3 K/UL    ABS. EOSINOPHILS 0.0 0.0 - 0.8 K/UL    ABS. BASOPHILS 0.1 0.0 - 0.2 K/UL    ABS. IMM. GRANS. 0.0 0.0 - 0.5 K/UL    DF AUTOMATED     METABOLIC PANEL, COMPREHENSIVE    Collection Time: 01/15/20  6:12 PM   Result Value Ref Range    Sodium 141 136 - 145 mmol/L    Potassium 3.4 (L) 3.5 - 5.1 mmol/L    Chloride 105 98 - 107 mmol/L    CO2 29 21 - 32 mmol/L    Anion gap 7 7 - 16 mmol/L    Glucose 92 65 - 100 mg/dL    BUN 28 (H) 8 - 23 MG/DL    Creatinine 0.88 0.6 - 1.0 MG/DL    GFR est AA >60 >60 ml/min/1.76m    GFR est non-AA >60 >60 ml/min/1.774m   Calcium 10.0 8.3 - 10.4 MG/DL    Bilirubin, total 0.6 0.2 - 1.1 MG/DL    ALT (SGPT) 18 12 - 65 U/L    AST (SGOT) 18 15 - 37 U/L    Alk. phosphatase 89 50 - 130 U/L    Protein, total 8.7 (H) 6.3 - 8.2 g/dL    Albumin 4.3 3.2 - 4.6 g/dL    Globulin 4.4 (H) 2.3 - 3.5 g/dL    A-G Ratio 1.0 (L) 1.2 - 3.5     LACTIC ACID    Collection Time: 01/15/20  6:12 PM   Result Value Ref Range    Lactic acid 0.9 0.4 - 2.0 MMOL/L   GLUCOSE, POC    Collection Time: 01/15/20  6:20 PM   Result Value Ref Range    Glucose (POC) 94 65 - 100 mg/dL    Performed by [email or web address]    URINALYSIS W/ RFLX MICROSCOPIC    Collection Time: 01/15/20  7:51 PM   Result Value Ref Range    Color YELLOW      Appearance CLEAR      Specific gravity 1.009 1.001 - 1.023      pH (UA) 6.5 5.0 - 9.0      Protein Negative NEG mg/dL    Glucose Negative mg/dL    Ketone Negative NEG mg/dL    Bilirubin Negative NEG      Blood Negative NEG      Urobilinogen 0.2 0.2 - 1.0 EU/dL    Nitrites Negative NEG      Leukocyte Esterase Negative NEG       CT HEAD WO CONT   Final Result   No CT evidence of acute  intracranial abnormality.            XR CHEST PORT   Final Result   1. Bilateral infiltrates.   2. Right upper lobe peripheral opacity could be infection. Recommend follow-up   radiography in several weeks after treatment to exclude underlying mass.   3. Hyperinflation, probable emphysema and COPD.             I discussed the results of all labs, procedures, radiographs, and treatments with the patient and available family.  Treatment plan is agreed upon and the patient is ready for discharge.  All voiced understanding of the discharge plan and medication instructions or changes as appropriate.  Questions about treatment in the ED  were answered.  All were encouraged to return should symptoms worsen or new problems develop.

## 2020-01-15 NOTE — ED Notes (Signed)
 I have reviewed discharge instructions with the patient.  The patient verbalized understanding.    Patient left ED via Discharge Method: wheelchair to Home with family.    Opportunity for questions and clarification provided.       Patient given 1 scripts.         To continue your aftercare when you leave the hospital, you may receive an automated call from our care team to check in on how you are doing.  This is a free service and part of our promise to provide the best care and service to meet your aftercare needs." If you have questions, or wish to unsubscribe from this service please call [phone].  Thank you for Choosing our [facility] Emergency Department.

## 2020-01-15 NOTE — ED Notes (Signed)
Pt daughter states that she has had AMS recently, saying things that do not make sense. States they were supposed to follow up with PCP Friday. AMS is baseline has gotten  worse over past day. Per daughter, mom did not want to go get lab work done so PCP said they should come to ED. Suspect possible UTI. Mask applied to pt.

## 2020-01-15 NOTE — ED Notes (Deleted)
Chart accessed as [facility] had not received script for keflex.  I gave him verbal order for what dr bourdon had ordered.

## 2020-01-15 NOTE — ED Notes (Signed)
I have reviewed discharge instructions with the patient.  The patient verbalized understanding.    Patient left ED via Discharge Method: wheelchair to Home with family).    Opportunity for questions and clarification provided.       Patient given 1 scripts.         To continue your aftercare when you leave the hospital, you may receive an automated call from our care team to check in on how you are doing.  This is a free service and part of our promise to provide the best care and service to meet your aftercare needs.??? If you have questions, or wish to unsubscribe from this service please call [phone].  Thank you for Choosing our [place] Emergency Department.

## 2020-01-15 NOTE — ED Provider Notes (Signed)
Per nurse's notes: "Pt daughter states that she has had AMS recently, saying things that do not make sense. States they were supposed to follow up with PCP Friday. AMS is baseline has gotten  worse over past day. Per daughter, mom did not want to go get lab work done so PCP said they should come to ED. Suspect possible UTI. Mask applied to pt. "    Historian is actually not the patient's daughter but her neighbor and good friend.  Patient lives at home with her husband who is in the lobby.    The history is provided by the patient.   Altered mental status   This is a new problem. The current episode started more than 2 days ago. The problem has been gradually worsening. Pertinent negatives include no confusion, no somnolence, no seizures, no unresponsiveness, no weakness, no agitation, no delusions, no hallucinations, no self-injury, no violence, no tingling and no numbness. Mental status baseline is normal.  Risk factors: Short-term memory issues. Her past medical history is significant for hypertension and dementia. Her past medical history does not include diabetes, seizures, liver disease, CVA, TIA, AIDS, COPD, depression, psychotropic medication treatment, head trauma or heart disease.        Past Medical History:   Diagnosis Date   ??? Arthritis     osteo-arthritis   ??? Cancer ([place])     Bilateral Breast Cancer   ??? Chronic kidney disease     followed by Dr. Stephens Shire   ??? Chronic UTI     Followed by Dr. Venia Minks, chronic macrobid   ??? Colon polyps    ??? Glaucoma    ??? Hematuria     followed by Dr. Irean Hong   ??? Hypertension    ??? Osteoporosis     followed by Dr. Debbra Riding   ??? Thyroid disease     low TSH followed by Dr. Janese Banks   ??? Vitamin D deficiency        Past Surgical History:   Procedure Laterality Date   ??? HX COLONOSCOPY  2006   ??? HX GYN      hysterectomy   ??? HX HEENT      tonsillectomy   ??? HX MASTECTOMY Bilateral 1988 and 2002   ??? PR COLONOSCOPY W/BIOPSY SINGLE/MULTIPLE  05/17/2017    polyp         Family History:    Problem Relation Age of Onset   ??? Cancer Mother    ??? Heart Disease Father        Social History     Socioeconomic History   ??? Marital status: MARRIED     Spouse name: Not on file   ??? Number of children: Not on file   ??? Years of education: Not on file   ??? Highest education level: Not on file   Occupational History   ??? Not on file   Social Needs   ??? Financial resource strain: Not on file   ??? Food insecurity     Worry: Not on file     Inability: Not on file   ??? Transportation needs     Medical: Not on file     Non-medical: Not on file   Tobacco Use   ??? Smoking status: Never Smoker   ??? Smokeless tobacco: Never Used   Substance and Sexual Activity   ??? Alcohol use: No     Alcohol/week: 0.0 standard drinks   ??? Drug use: No   ??? Sexual activity: Not on  file   Lifestyle   ??? Physical activity     Days per week: Not on file     Minutes per session: Not on file   ??? Stress: Not on file   Relationships   ??? Social Product manager on phone: Not on file     Gets together: Not on file     Attends religious service: Not on file     Active member of club or organization: Not on file     Attends meetings of clubs or organizations: Not on file     Relationship status: Not on file   ??? Intimate partner violence     Fear of current or ex partner: Not on file     Emotionally abused: Not on file     Physically abused: Not on file     Forced sexual activity: Not on file   Other Topics Concern   ??? Not on file   Social History Narrative   ??? Not on file         ALLERGIES: Nsaids (non-steroidal anti-inflammatory drug), Azithromycin, Celebrex [celecoxib], Ibuprofen, Penicillins, Sulfa (sulfonamide antibiotics), and Tetracyclines    Review of Systems   Constitutional: Negative for chills and fever.   Neurological: Negative for dizziness, tingling, seizures, weakness, numbness and headaches.   Psychiatric/Behavioral: Negative for agitation, confusion, hallucinations and self-injury.   All other systems reviewed and are negative.      Vitals:     01/15/20 1728   BP: (!) 168/83   Pulse: 89   Resp: 18   SpO2: 99%            Physical Exam  Vitals signs and nursing note reviewed.   Constitutional:       General: She is not in acute distress.     Appearance: She is well-developed and underweight.   HENT:      Head: Normocephalic and atraumatic.      Right Ear: External ear normal.      Left Ear: External ear normal.   Eyes:      Extraocular Movements: Extraocular movements intact.      Conjunctiva/sclera: Conjunctivae normal.      Pupils: Pupils are equal, round, and reactive to light.   Neck:      Musculoskeletal: Normal range of motion and neck supple.   Cardiovascular:      Rate and Rhythm: Normal rate and regular rhythm.      Heart sounds: Normal heart sounds.   Pulmonary:      Effort: Pulmonary effort is normal.      Breath sounds: Normal breath sounds. No wheezing, rhonchi or rales.   Abdominal:      General: Bowel sounds are normal.      Palpations: Abdomen is soft.      Tenderness: There is no abdominal tenderness. There is no right CVA tenderness or left CVA tenderness.   Musculoskeletal: Normal range of motion.      Right lower leg: No edema.      Left lower leg: No edema.   Skin:     General: Skin is warm and dry.      Capillary Refill: Capillary refill takes less than 2 seconds.   Neurological:      Mental Status: She is alert. Mental status is at baseline.      Cranial Nerves: No cranial nerve deficit.      Sensory: No sensory deficit.      Comments: Patient cannot  tell me the date/year or who the president is, and is somewhat confused with where she is now.   Psychiatric:         Mood and Affect: Mood normal.          MDM  Number of Diagnoses or Management Options  Abnormal chest x-ray: new and requires workup  Dehydration: new and requires workup  Short-term memory loss: new and requires workup     Amount and/or Complexity of Data Reviewed  Clinical lab tests: ordered and reviewed  Tests in the radiology section of CPT??: ordered and reviewed  Tests  in the medicine section of CPT??: ordered and reviewed (EKG interpretation for EKG dated 12 May 21 at 1811: EKG reveals normal sinus rhythm at a rate of 82 bpm, biatrial enlargement, LVH, possible old septal infarct. Abnormal EKG.   Daron Offer, MD  )  Obtain history from someone other than the patient: yes  Review and summarize past medical records: yes    Risk of Complications, Morbidity, and/or Mortality  Presenting problems: moderate  Diagnostic procedures: moderate  Management options: moderate    Patient Progress  Patient progress: stable         Procedures    The patient was observed in the ED. patient has had no cough, fever, chills or shortness of breath.  We will treat the abnormality on chest x-ray with antibiotics for 1 week, and choice of antibiotic was Keflex to the multiple allergies the patient.  Radiology review recommends repeat chest x-ray in 3 to 4 weeks.    Results Reviewed:      Recent Results (from the past 24 hour(s))   EKG, 12 LEAD, INITIAL    Collection Time: 01/15/20  6:11 PM   Result Value Ref Range    Ventricular Rate 82 BPM    Atrial Rate 82 BPM    P-R Interval 162 ms    QRS Duration 84 ms    Q-T Interval 380 ms    QTC Calculation (Bezet) 443 ms    Calculated P Axis 83 degrees    Calculated R Axis 35 degrees    Calculated T Axis 75 degrees    Diagnosis       Normal sinus rhythm  Biatrial enlargement  Left ventricular hypertrophy  Anteroseptal infarct , age undetermined  Abnormal ECG  No previous ECGs available  Confirmed by GRABARCZYK  MD (UC), MARK A ([id number]) on 01/15/2020 6:45:08 PM     CBC WITH AUTOMATED DIFF    Collection Time: 01/15/20  6:12 PM   Result Value Ref Range    WBC 9.4 4.3 - 11.1 K/uL    RBC 5.14 4.05 - 5.2 M/uL    HGB 16.1 (H) 11.7 - 15.4 g/dL    HCT 48.1 (H) 35.8 - 46.3 %    MCV 93.6 79.6 - 97.8 FL    MCH 31.3 26.1 - 32.9 PG    MCHC 33.5 31.4 - 35.0 g/dL    RDW 12.6 11.9 - 14.6 %    PLATELET 255 150 - 450 K/uL    MPV 10.5 9.4 - 12.3 FL    ABSOLUTE NRBC 0.00 0.0 - 0.2  K/uL    NEUTROPHILS 69 43 - 78 %    LYMPHOCYTES 18 13 - 44 %    MONOCYTES 12 4.0 - 12.0 %    EOSINOPHILS 0 (L) 0.5 - 7.8 %    BASOPHILS 1 0.0 - 2.0 %    IMMATURE GRANULOCYTES 0 0.0 -  5.0 %    ABS. NEUTROPHILS 6.5 1.7 - 8.2 K/UL    ABS. LYMPHOCYTES 1.7 0.5 - 4.6 K/UL    ABS. MONOCYTES 1.1 0.1 - 1.3 K/UL    ABS. EOSINOPHILS 0.0 0.0 - 0.8 K/UL    ABS. BASOPHILS 0.1 0.0 - 0.2 K/UL    ABS. IMM. GRANS. 0.0 0.0 - 0.5 K/UL    DF AUTOMATED     METABOLIC PANEL, COMPREHENSIVE    Collection Time: 01/15/20  6:12 PM   Result Value Ref Range    Sodium 141 136 - 145 mmol/L    Potassium 3.4 (L) 3.5 - 5.1 mmol/L    Chloride 105 98 - 107 mmol/L    CO2 29 21 - 32 mmol/L    Anion gap 7 7 - 16 mmol/L    Glucose 92 65 - 100 mg/dL    BUN 28 (H) 8 - 23 MG/DL    Creatinine 0.88 0.6 - 1.0 MG/DL    GFR est AA >60 >60 ml/min/1.5m    GFR est non-AA >60 >60 ml/min/1.770m   Calcium 10.0 8.3 - 10.4 MG/DL    Bilirubin, total 0.6 0.2 - 1.1 MG/DL    ALT (SGPT) 18 12 - 65 U/L    AST (SGOT) 18 15 - 37 U/L    Alk. phosphatase 89 50 - 130 U/L    Protein, total 8.7 (H) 6.3 - 8.2 g/dL    Albumin 4.3 3.2 - 4.6 g/dL    Globulin 4.4 (H) 2.3 - 3.5 g/dL    A-G Ratio 1.0 (L) 1.2 - 3.5     LACTIC ACID    Collection Time: 01/15/20  6:12 PM   Result Value Ref Range    Lactic acid 0.9 0.4 - 2.0 MMOL/L   GLUCOSE, POC    Collection Time: 01/15/20  6:20 PM   Result Value Ref Range    Glucose (POC) 94 65 - 100 mg/dL    Performed by [email or web address]    URINALYSIS W/ RFLX MICROSCOPIC    Collection Time: 01/15/20  7:51 PM   Result Value Ref Range    Color YELLOW      Appearance CLEAR      Specific gravity 1.009 1.001 - 1.023      pH (UA) 6.5 5.0 - 9.0      Protein Negative NEG mg/dL    Glucose Negative mg/dL    Ketone Negative NEG mg/dL    Bilirubin Negative NEG      Blood Negative NEG      Urobilinogen 0.2 0.2 - 1.0 EU/dL    Nitrites Negative NEG      Leukocyte Esterase Negative NEG       CT HEAD WO CONT   Final Result   No CT evidence of acute intracranial  abnormality.            XR CHEST PORT   Final Result   1. Bilateral infiltrates.   2. Right upper lobe peripheral opacity could be infection. Recommend follow-up   radiography in several weeks after treatment to exclude underlying mass.   3. Hyperinflation, probable emphysema and COPD.             I discussed the results of all labs, procedures, radiographs, and treatments with the patient and available family.  Treatment plan is agreed upon and the patient is ready for discharge.  All voiced understanding of the discharge plan and medication instructions or changes as appropriate.  Questions about treatment in the ED  were answered.  All were encouraged to return should symptoms worsen or new problems develop.

## 2020-01-16 LAB — URINALYSIS W/ RFLX MICROSCOPIC
Bilirubin, Urine: NEGATIVE
Bilirubin: NEGATIVE
Blood, Urine: NEGATIVE
Blood: NEGATIVE
Glucose, Ur: NEGATIVE mg/dL
Glucose: NEGATIVE mg/dL
Ketone: NEGATIVE mg/dL
Ketones, Urine: NEGATIVE mg/dL
Leukocyte Esterase, Urine: NEGATIVE
Leukocyte Esterase: NEGATIVE
Nitrite, Urine: NEGATIVE
Nitrites: NEGATIVE
Protein, UA: NEGATIVE mg/dL
Protein: NEGATIVE mg/dL
Specific Gravity, UA: 1.009 (ref 1.001–1.023)
Specific gravity: 1.009 (ref 1.001–1.023)
Urobilinogen, UA, POCT: 0.2 EU/dL (ref 0.2–1.0)
Urobilinogen: 0.2 EU/dL (ref 0.2–1.0)
pH (UA): 6.5 (ref 5.0–9.0)
pH, UA: 6.5 (ref 5.0–9.0)

## 2020-01-16 MED ORDER — CEPHALEXIN 500 MG CAP
500 mg | ORAL_CAPSULE | Freq: Three times a day (TID) | ORAL | 0 refills | Status: AC
Start: 2020-01-16 — End: 2020-01-22

## 2020-01-17 ENCOUNTER — Ambulatory Visit: Admit: 2020-01-17 | Discharge: 2020-01-17 | Payer: MEDICARE | Attending: Family Medicine | Primary: Family Medicine

## 2020-01-17 ENCOUNTER — Ambulatory Visit: Attending: Family Medicine | Primary: Family Medicine

## 2020-01-17 DIAGNOSIS — E559 Vitamin D deficiency, unspecified: Secondary | ICD-10-CM

## 2020-01-17 LAB — TSH: TSH: 1.9 (ref <=5.90)

## 2020-01-17 LAB — VITAMIN B12: Vitamin B-12: 354

## 2020-01-17 LAB — VITAMIN D 25 HYDROXY (VIT D DEFICIENCY, FRACTURES): Vit D, 25-Hydroxy: 28.9

## 2020-01-17 MED ORDER — LATANOPROST 0.005 % EYE DROPS
0.005 % | OPHTHALMIC | 11 refills | Status: AC
Start: 2020-01-17 — End: ?

## 2020-01-17 MED ORDER — CEFTRIAXONE 500 MG SOLUTION FOR INJECTION
500 mg | Freq: Once | INTRAMUSCULAR | Status: AC
Start: 2020-01-17 — End: 2020-01-17
  Administered 2020-01-17: 19:00:00 via INTRAMUSCULAR

## 2020-01-17 NOTE — Progress Notes (Signed)
HISTORY OF PRESENT ILLNESS  Melanie Houston is a 86 y.o. female.  Cough  The history is provided by the patient, spouse and relative (noted on ER visit 2 day ago ). This is a new problem. The problem has been gradually improving. Pertinent negatives include no chest pain, no abdominal pain, no headaches and no shortness of breath.   Memory Loss   The history is provided by the patient. This is a recurrent problem. The problem has been gradually worsening. Pertinent negatives include no weakness. Mental status baseline is moderate dementia.      Allergies   Allergen Reactions   ??? Nsaids (Non-Steroidal Anti-Inflammatory Drug) Other (comments)   ??? Azithromycin Unknown (comments)   ??? Celebrex [Celecoxib] Unknown (comments)   ??? Ibuprofen Unknown (comments)   ??? Penicillins Unknown (comments)   ??? Sulfa (Sulfonamide Antibiotics) Unknown (comments)   ??? Tetracyclines Unknown (comments)     Current Outpatient Medications   Medication Sig   ??? latanoprost (XALATAN) 0.005 % ophthalmic solution INSTILL 1 DROP INTO BOTH EYES AT BEDTIME   ??? cephALEXin (Keflex) 500 mg capsule Take 1 Cap by mouth three (3) times daily for 7 days.   ??? metoprolol succinate (TOPROL-XL) 100 mg tablet Take 1 Tab by mouth daily.   ??? OTHER,NON-FORMULARY, Unknown eye drop for glaucoma   ??? dorzolamide-timolol (COSOPT) 22.3-6.8 mg/mL ophthalmic solution INSTILL 1 DROP INTO BOTH EYES TWICE A DAY   ??? cholecalciferol (VITAMIN D3) 1,000 unit cap Take  by mouth daily (with dinner).   ??? cholecalciferol, vitamin D3, 2,000 unit tab Take  by mouth.   ??? ascorbic acid (VITAMIN C) 500 mg tablet Strength: ; Form: ; SIG: take 1 tablet orally Daily     No current facility-administered medications for this visit.      Past Medical History:   Diagnosis Date   ??? Arthritis     osteo-arthritis   ??? Cancer ([place])     Bilateral Breast Cancer   ??? Chronic kidney disease     followed by Dr. Stephens Shire   ??? Chronic UTI     Followed by Dr. Venia Minks, chronic macrobid   ??? Colon polyps    ???  Glaucoma    ??? Hematuria     followed by Dr. Irean Hong   ??? Hypertension    ??? Osteoporosis     followed by Dr. Debbra Riding   ??? Thyroid disease     low TSH followed by Dr. Janese Banks   ??? Vitamin D deficiency      Past Surgical History:   Procedure Laterality Date   ??? HX COLONOSCOPY  2006   ??? HX GYN      hysterectomy   ??? HX HEENT      tonsillectomy   ??? HX MASTECTOMY Bilateral 1988 and 2002   ??? PR COLONOSCOPY W/BIOPSY SINGLE/MULTIPLE  05/17/2017    polyp     Social History     Socioeconomic History   ??? Marital status: MARRIED     Spouse name: Not on file   ??? Number of children: Not on file   ??? Years of education: Not on file   ??? Highest education level: Not on file   Occupational History   ??? Not on file   Social Needs   ??? Financial resource strain: Not on file   ??? Food insecurity     Worry: Not on file     Inability: Not on file   ??? Transportation needs     Medical:  Not on file     Non-medical: Not on file   Tobacco Use   ??? Smoking status: Never Smoker   ??? Smokeless tobacco: Never Used   Substance and Sexual Activity   ??? Alcohol use: No     Alcohol/week: 0.0 standard drinks   ??? Drug use: No   ??? Sexual activity: Not on file   Lifestyle   ??? Physical activity     Days per week: Not on file     Minutes per session: Not on file   ??? Stress: Not on file   Relationships   ??? Social Product manager on phone: Not on file     Gets together: Not on file     Attends religious service: Not on file     Active member of club or organization: Not on file     Attends meetings of clubs or organizations: Not on file     Relationship status: Not on file   ??? Intimate partner violence     Fear of current or ex partner: Not on file     Emotionally abused: Not on file     Physically abused: Not on file     Forced sexual activity: Not on file   Other Topics Concern   ??? Not on file   Social History Narrative   ??? Not on file       Review of Systems   Constitutional: Negative for chills, fever and malaise/fatigue.   HENT: Negative for congestion and  hearing loss.    Eyes: Negative for blurred vision, pain and discharge.   Respiratory: Positive for cough. Negative for hemoptysis, shortness of breath and wheezing.    Cardiovascular: Negative for chest pain, palpitations and leg swelling.   Gastrointestinal: Negative for abdominal pain, blood in stool, constipation, diarrhea, heartburn and nausea.   Genitourinary: Negative for dysuria, frequency, hematuria and urgency.   Musculoskeletal: Negative for joint pain and myalgias.   Skin: Negative for rash.   Neurological: Negative for dizziness, tremors, sensory change, speech change, focal weakness, weakness and headaches.   Endo/Heme/Allergies: Negative for polydipsia. Does not bruise/bleed easily.   Psychiatric/Behavioral: Positive for memory loss. Negative for depression. The patient is nervous/anxious. The patient does not have insomnia.      Blood pressure (!) 142/68, pulse 78, weight 87 lb (39.5 kg), SpO2 99 %.      Physical Exam  Vitals signs and nursing note reviewed.   Constitutional:       General: She is not in acute distress.     Appearance: She is well-developed.   HENT:      Head: Atraumatic.      Right Ear: External ear normal.      Left Ear: External ear normal.      Nose: Nose normal.      Mouth/Throat:      Pharynx: No oropharyngeal exudate.   Eyes:      General: No scleral icterus.        Right eye: No discharge.         Left eye: No discharge.      Conjunctiva/sclera: Conjunctivae normal.      Pupils: Pupils are equal, round, and reactive to light.   Neck:      Musculoskeletal: Normal range of motion.      Thyroid: No thyromegaly.   Cardiovascular:      Rate and Rhythm: Normal rate and regular rhythm.      Heart  sounds: Normal heart sounds. No murmur. No friction rub. No gallop.    Pulmonary:      Effort: Pulmonary effort is normal. No respiratory distress.      Breath sounds: Normal breath sounds. No wheezing or rales.   Chest:      Chest wall: No tenderness.   Abdominal:      General: There is no  distension.      Palpations: There is no mass.      Tenderness: There is no abdominal tenderness. There is no guarding or rebound.   Musculoskeletal: Normal range of motion.         General: No tenderness.   Lymphadenopathy:      Cervical: No cervical adenopathy.   Skin:     General: Skin is warm and dry.   Neurological:      Mental Status: She is alert and oriented to person, place, and time.      Coordination: Coordination normal.       Office Visit on 01/17/2020   Component Date Value Ref Range Status   ??? VITAMIN D, 25-HYDROXY 01/17/2020 28.9* 30.0 - 100.0 ng/mL Final    Comment: Vitamin D deficiency has been defined by the [place] practice guideline as a  level of serum 25-OH vitamin D less than 20 ng/mL (1,2).  The Endocrine Society went on to further define vitamin D  insufficiency as a level between 21 and 29 ng/mL (2).  1. IOM (Institute of Medicine). 2010. Dietary reference     intakes for calcium and D. [place]: The     [employer].  2. Holick MF, Binkley NC, Bischoff-Ferrari HA, et al.     Evaluation, treatment, and prevention of vitamin D     deficiency: an Endocrine Society clinical practice     guideline. JCEM. 2011 Jul; 96(7):1911-30.     ??? Vitamin B12 01/17/2020 354  232 - 1,245 pg/mL Final   ??? TSH 01/17/2020 1.900  0.450 - 4.500 uIU/mL Final   Admission on 01/15/2020, Discharged on 01/15/2020   Component Date Value Ref Range Status   ??? WBC 01/15/2020 9.4  4.3 - 11.1 K/uL Final   ??? RBC 01/15/2020 5.14  4.05 - 5.2 M/uL Final   ??? HGB 01/15/2020 16.1* 11.7 - 15.4 g/dL Final   ??? HCT 01/15/2020 48.1* 35.8 - 46.3 % Final   ??? MCV 01/15/2020 93.6  79.6 - 97.8 FL Final   ??? MCH 01/15/2020 31.3  26.1 - 32.9 PG Final   ??? MCHC 01/15/2020 33.5  31.4 - 35.0 g/dL Final   ??? RDW 01/15/2020 12.6  11.9 - 14.6 % Final   ??? PLATELET 01/15/2020 255  150 - 450 K/uL Final   ??? MPV 01/15/2020 10.5  9.4 - 12.3 FL Final   ??? ABSOLUTE NRBC 01/15/2020 0.00  0.0 - 0.2 K/uL  Final    **Note: Absolute NRBC parameter is now reported with Hemogram**   ??? NEUTROPHILS 01/15/2020 69  43 - 78 % Final   ??? LYMPHOCYTES 01/15/2020 18  13 - 44 % Final   ??? MONOCYTES 01/15/2020 12  4.0 - 12.0 % Final   ??? EOSINOPHILS 01/15/2020 0* 0.5 - 7.8 % Final   ??? BASOPHILS 01/15/2020 1  0.0 - 2.0 % Final   ??? IMMATURE GRANULOCYTES 01/15/2020 0  0.0 - 5.0 % Final   ??? ABS. NEUTROPHILS 01/15/2020 6.5  1.7 - 8.2 K/UL Final   ??? ABS. LYMPHOCYTES  01/15/2020 1.7  0.5 - 4.6 K/UL Final   ??? ABS. MONOCYTES 01/15/2020 1.1  0.1 - 1.3 K/UL Final   ??? ABS. EOSINOPHILS 01/15/2020 0.0  0.0 - 0.8 K/UL Final   ??? ABS. BASOPHILS 01/15/2020 0.1  0.0 - 0.2 K/UL Final   ??? ABS. IMM. GRANS. 01/15/2020 0.0  0.0 - 0.5 K/UL Final   ??? DF 01/15/2020 AUTOMATED    Final   ??? Sodium 01/15/2020 141  136 - 145 mmol/L Final   ??? Potassium 01/15/2020 3.4* 3.5 - 5.1 mmol/L Final   ??? Chloride 01/15/2020 105  98 - 107 mmol/L Final   ??? CO2 01/15/2020 29  21 - 32 mmol/L Final   ??? Anion gap 01/15/2020 7  7 - 16 mmol/L Final   ??? Glucose 01/15/2020 92  65 - 100 mg/dL Final    Comment: 47 - 60 mg/dl Consistent with, but not fully diagnostic of hypoglycemia.  101 - 125 mg/dl Impaired fasting glucose/consistent with pre-diabetes mellitus  > 126 mg/dl Fasting glucose consistent with overt diabetes mellitus     ??? BUN 01/15/2020 28* 8 - 23 MG/DL Final   ??? Creatinine 01/15/2020 0.88  0.6 - 1.0 MG/DL Final   ??? GFR est AA 01/15/2020 >60  >60 ml/min/1.67m Final   ??? GFR est non-AA 01/15/2020 >60  >60 ml/min/1.714mFinal    Comment: (NOTE)  Estimated GFR is calculated using the Modification of Diet in Renal   Disease (MDRD) Study equation, reported for both African Americans   (GFRAA) and non-African Americans (GFRNA), and normalized to 1.7331m body surface area. The physician must decide which value applies to   the patient. The MDRD study equation should only be used in   individuals age 24 59 older. It has not been validated for the   following: pregnant women, patients  with serious comorbid conditions,   or on certain medications, or persons with extremes of body size,   muscle mass, or nutritional status.     ??? Calcium 01/15/2020 10.0  8.3 - 10.4 MG/DL Final   ??? Bilirubin, total 01/15/2020 0.6  0.2 - 1.1 MG/DL Final   ??? ALT (SGPT) 01/15/2020 18  12 - 65 U/L Final   ??? AST (SGOT) 01/15/2020 18  15 - 37 U/L Final   ??? Alk. phosphatase 01/15/2020 89  50 - 130 U/L Final   ??? Protein, total 01/15/2020 8.7* 6.3 - 8.2 g/dL Final   ??? Albumin 01/15/2020 4.3  3.2 - 4.6 g/dL Final   ??? Globulin 01/15/2020 4.4* 2.3 - 3.5 g/dL Final   ??? A-G Ratio 01/15/2020 1.0* 1.2 - 3.5   Final   ??? Lactic acid 01/15/2020 0.9  0.4 - 2.0 MMOL/L Final   ??? Ventricular Rate 01/15/2020 82  BPM Final   ??? Atrial Rate 01/15/2020 82  BPM Final   ??? P-R Interval 01/15/2020 162  ms Final   ??? QRS Duration 01/15/2020 84  ms Final   ??? Q-T Interval 01/15/2020 380  ms Final   ??? QTC Calculation (Bezet) 01/15/2020 443  ms Final   ??? Calculated P Axis 01/15/2020 83  degrees Final   ??? Calculated R Axis 01/15/2020 35  degrees Final   ??? Calculated T Axis 01/15/2020 75  degrees Final   ??? Diagnosis 01/15/2020    Final                    Value:Normal sinus rhythm  Biatrial enlargement  Left ventricular hypertrophy  Anteroseptal infarct , age undetermined  Abnormal ECG  No previous ECGs available  Confirmed by GRABARCZYK  MD ([facility]), Allayah Raineri A ([id number]) on 01/15/2020 6:45:08 PM     ??? Color 01/15/2020 YELLOW    Final   ??? Appearance 01/15/2020 CLEAR    Final   ??? Specific gravity 01/15/2020 1.009  1.001 - 1.023   Final   ??? pH (UA) 01/15/2020 6.5  5.0 - 9.0   Final   ??? Protein 01/15/2020 Negative  NEG mg/dL Final   ??? Glucose 01/15/2020 Negative  mg/dL Final   ??? Ketone 01/15/2020 Negative  NEG mg/dL Final   ??? Bilirubin 01/15/2020 Negative  NEG   Final   ??? Blood 01/15/2020 Negative  NEG   Final   ??? Urobilinogen 01/15/2020 0.2  0.2 - 1.0 EU/dL Final   ??? Nitrites 01/15/2020 Negative  NEG   Final   ??? Leukocyte Esterase 01/15/2020 Negative  NEG   Final   ???  Glucose (POC) 01/15/2020 94  65 - 100 mg/dL Final    Comment: 47 - 60 mg/dl Consistent with, but not fully diagnostic of hypoglycemia.  101 - 125 mg/dl Impaired fasting glucose/consistent with pre-diabetes mellitus  > 126 mg/dl Fasting glucose consistent with overt diabetes mellitus     ??? Performed by 01/15/2020 [email or web address]   Final         ASSESSMENT and PLAN    ICD-10-CM ICD-9-CM    1. Vitamin D deficiency  E55.9 268.9 VITAMIN D, 25 HYDROXY      VITAMIN B12      TSH 3RD GENERATION   2. Anemia, unspecified type  D64.9 285.9 VITAMIN D, 25 HYDROXY      VITAMIN B12      TSH 3RD GENERATION   3. Memory loss, short term  R41.3 780.93 VITAMIN D, 25 HYDROXY      VITAMIN B12      TSH 3RD GENERATION      REFERRAL TO NEUROLOGY   4. Pneumonia due to infectious organism, unspecified laterality, unspecified part of lung  J18.9 486 cefTRIAXone (ROCEPHIN) injection 500 mg   5. 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 Vitamin D deficiency  -     VITAMIN D, 25 HYDROXY  -     VITAMIN B12  -     TSH 3RD GENERATION    2. Anemia, unspecified type  -     VITAMIN D, 25 HYDROXY  -     VITAMIN B12  -     TSH 3RD GENERATION    3. Memory loss, short term  -     VITAMIN D, 25 HYDROXY  -     VITAMIN B12  -     TSH 3RD GENERATION  -     REFERRAL TO NEUROLOGY    4. Pneumonia due to infectious organism, unspecified laterality, unspecified part of lung  -     cefTRIAXone (ROCEPHIN) injection 500 mg    5. 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

## 2020-01-17 NOTE — Progress Notes (Signed)
Per Dr. White, Notify husband and daughter , she needs b-12 shot q week x 3 , vit D 2000 every day and we should have already made the referral to Neuro , please check---Called patient's daughter and didn't get an answer, called and spoke with patient's husband and went over everything. He says they are waiting on a call from neuro to set up that appointment.

## 2020-01-17 NOTE — Progress Notes (Signed)
HISTORY OF PRESENT ILLNESS  Melanie Houston is a 86 y.o. female.  Cough  The history is provided by the patient, spouse and relative (noted on ER visit 2 day ago ). This is a new problem. The problem has been gradually improving. Pertinent negatives include no chest pain, no abdominal pain, no headaches and no shortness of breath.   Memory Loss   The history is provided by the patient. This is a recurrent problem. The problem has been gradually worsening. Pertinent negatives include no weakness. Mental status baseline is moderate dementia.      Allergies   Allergen Reactions   ??? Nsaids (Non-Steroidal Anti-Inflammatory Drug) Other (comments)   ??? Azithromycin Unknown (comments)   ??? Celebrex [Celecoxib] Unknown (comments)   ??? Ibuprofen Unknown (comments)   ??? Penicillins Unknown (comments)   ??? Sulfa (Sulfonamide Antibiotics) Unknown (comments)   ??? Tetracyclines Unknown (comments)     Current Outpatient Medications   Medication Sig   ??? latanoprost (XALATAN) 0.005 % ophthalmic solution INSTILL 1 DROP INTO BOTH EYES AT BEDTIME   ??? cephALEXin (Keflex) 500 mg capsule Take 1 Cap by mouth three (3) times daily for 7 days.   ??? metoprolol succinate (TOPROL-XL) 100 mg tablet Take 1 Tab by mouth daily.   ??? OTHER,NON-FORMULARY, Unknown eye drop for glaucoma   ??? dorzolamide-timolol (COSOPT) 22.3-6.8 mg/mL ophthalmic solution INSTILL 1 DROP INTO BOTH EYES TWICE A DAY   ??? cholecalciferol (VITAMIN D3) 1,000 unit cap Take  by mouth daily (with dinner).   ??? cholecalciferol, vitamin D3, 2,000 unit tab Take  by mouth.   ??? ascorbic acid (VITAMIN C) 500 mg tablet Strength: ; Form: ; SIG: take 1 tablet orally Daily     No current facility-administered medications for this visit.      Past Medical History:   Diagnosis Date   ??? Arthritis     osteo-arthritis   ??? Cancer ([place])     Bilateral Breast Cancer   ??? Chronic kidney disease     followed by Dr. Stephens Shire   ??? Chronic UTI     Followed by Dr. Venia Minks, chronic macrobid   ??? Colon polyps    ???  Glaucoma    ??? Hematuria     followed by Dr. Irean Hong   ??? Hypertension    ??? Osteoporosis     followed by Dr. Debbra Riding   ??? Thyroid disease     low TSH followed by Dr. Janese Banks   ??? Vitamin D deficiency      Past Surgical History:   Procedure Laterality Date   ??? HX COLONOSCOPY  2006   ??? HX GYN      hysterectomy   ??? HX HEENT      tonsillectomy   ??? HX MASTECTOMY Bilateral 1988 and 2002   ??? PR COLONOSCOPY W/BIOPSY SINGLE/MULTIPLE  05/17/2017    polyp     Social History     Socioeconomic History   ??? Marital status: MARRIED     Spouse name: Not on file   ??? Number of children: Not on file   ??? Years of education: Not on file   ??? Highest education level: Not on file   Occupational History   ??? Not on file   Social Needs   ??? Financial resource strain: Not on file   ??? Food insecurity     Worry: Not on file     Inability: Not on file   ??? Transportation needs     Medical:  Not on file     Non-medical: Not on file   Tobacco Use   ??? Smoking status: Never Smoker   ??? Smokeless tobacco: Never Used   Substance and Sexual Activity   ??? Alcohol use: No     Alcohol/week: 0.0 standard drinks   ??? Drug use: No   ??? Sexual activity: Not on file   Lifestyle   ??? Physical activity     Days per week: Not on file     Minutes per session: Not on file   ??? Stress: Not on file   Relationships   ??? Social Product manager on phone: Not on file     Gets together: Not on file     Attends religious service: Not on file     Active member of club or organization: Not on file     Attends meetings of clubs or organizations: Not on file     Relationship status: Not on file   ??? Intimate partner violence     Fear of current or ex partner: Not on file     Emotionally abused: Not on file     Physically abused: Not on file     Forced sexual activity: Not on file   Other Topics Concern   ??? Not on file   Social History Narrative   ??? Not on file       Review of Systems   Constitutional: Negative for chills, fever and malaise/fatigue.   HENT: Negative for congestion and  hearing loss.    Eyes: Negative for blurred vision, pain and discharge.   Respiratory: Positive for cough. Negative for hemoptysis, shortness of breath and wheezing.    Cardiovascular: Negative for chest pain, palpitations and leg swelling.   Gastrointestinal: Negative for abdominal pain, blood in stool, constipation, diarrhea, heartburn and nausea.   Genitourinary: Negative for dysuria, frequency, hematuria and urgency.   Musculoskeletal: Negative for joint pain and myalgias.   Skin: Negative for rash.   Neurological: Negative for dizziness, tremors, sensory change, speech change, focal weakness, weakness and headaches.   Endo/Heme/Allergies: Negative for polydipsia. Does not bruise/bleed easily.   Psychiatric/Behavioral: Positive for memory loss. Negative for depression. The patient is nervous/anxious. The patient does not have insomnia.      Blood pressure (!) 142/68, pulse 78, weight 87 lb (39.5 kg), SpO2 99 %.      Physical Exam  Vitals signs and nursing note reviewed.   Constitutional:       General: She is not in acute distress.     Appearance: She is well-developed.   HENT:      Head: Atraumatic.      Right Ear: External ear normal.      Left Ear: External ear normal.      Nose: Nose normal.      Mouth/Throat:      Pharynx: No oropharyngeal exudate.   Eyes:      General: No scleral icterus.        Right eye: No discharge.         Left eye: No discharge.      Conjunctiva/sclera: Conjunctivae normal.      Pupils: Pupils are equal, round, and reactive to light.   Neck:      Musculoskeletal: Normal range of motion.      Thyroid: No thyromegaly.   Cardiovascular:      Rate and Rhythm: Normal rate and regular rhythm.      Heart  sounds: Normal heart sounds. No murmur. No friction rub. No gallop.    Pulmonary:      Effort: Pulmonary effort is normal. No respiratory distress.      Breath sounds: Normal breath sounds. No wheezing or rales.   Chest:      Chest wall: No tenderness.   Abdominal:      General: There is no  distension.      Palpations: There is no mass.      Tenderness: There is no abdominal tenderness. There is no guarding or rebound.   Musculoskeletal: Normal range of motion.         General: No tenderness.   Lymphadenopathy:      Cervical: No cervical adenopathy.   Skin:     General: Skin is warm and dry.   Neurological:      Mental Status: She is alert and oriented to person, place, and time.      Coordination: Coordination normal.       Office Visit on 01/17/2020   Component Date Value Ref Range Status   ??? VITAMIN D, 25-HYDROXY 01/17/2020 28.9* 30.0 - 100.0 ng/mL Final    Comment: Vitamin D deficiency has been defined by the [place] practice guideline as a  level of serum 25-OH vitamin D less than 20 ng/mL (1,2).  The Endocrine Society went on to further define vitamin D  insufficiency as a level between 21 and 29 ng/mL (2).  1. IOM (Institute of Medicine). 2010. Dietary reference     intakes for calcium and D. [place]: The     [employer].  2. Holick MF, Binkley NC, Bischoff-Ferrari HA, et al.     Evaluation, treatment, and prevention of vitamin D     deficiency: an Endocrine Society clinical practice     guideline. JCEM. 2011 Jul; 96(7):1911-30.     ??? Vitamin B12 01/17/2020 354  232 - 1,245 pg/mL Final   ??? TSH 01/17/2020 1.900  0.450 - 4.500 uIU/mL Final   Admission on 01/15/2020, Discharged on 01/15/2020   Component Date Value Ref Range Status   ??? WBC 01/15/2020 9.4  4.3 - 11.1 K/uL Final   ??? RBC 01/15/2020 5.14  4.05 - 5.2 M/uL Final   ??? HGB 01/15/2020 16.1* 11.7 - 15.4 g/dL Final   ??? HCT 01/15/2020 48.1* 35.8 - 46.3 % Final   ??? MCV 01/15/2020 93.6  79.6 - 97.8 FL Final   ??? MCH 01/15/2020 31.3  26.1 - 32.9 PG Final   ??? MCHC 01/15/2020 33.5  31.4 - 35.0 g/dL Final   ??? RDW 01/15/2020 12.6  11.9 - 14.6 % Final   ??? PLATELET 01/15/2020 255  150 - 450 K/uL Final   ??? MPV 01/15/2020 10.5  9.4 - 12.3 FL Final   ??? ABSOLUTE NRBC 01/15/2020 0.00  0.0 - 0.2 K/uL  Final    **Note: Absolute NRBC parameter is now reported with Hemogram**   ??? NEUTROPHILS 01/15/2020 69  43 - 78 % Final   ??? LYMPHOCYTES 01/15/2020 18  13 - 44 % Final   ??? MONOCYTES 01/15/2020 12  4.0 - 12.0 % Final   ??? EOSINOPHILS 01/15/2020 0* 0.5 - 7.8 % Final   ??? BASOPHILS 01/15/2020 1  0.0 - 2.0 % Final   ??? IMMATURE GRANULOCYTES 01/15/2020 0  0.0 - 5.0 % Final   ??? ABS. NEUTROPHILS 01/15/2020 6.5  1.7 - 8.2 K/UL Final   ??? ABS. LYMPHOCYTES  01/15/2020 1.7  0.5 - 4.6 K/UL Final   ??? ABS. MONOCYTES 01/15/2020 1.1  0.1 - 1.3 K/UL Final   ??? ABS. EOSINOPHILS 01/15/2020 0.0  0.0 - 0.8 K/UL Final   ??? ABS. BASOPHILS 01/15/2020 0.1  0.0 - 0.2 K/UL Final   ??? ABS. IMM. GRANS. 01/15/2020 0.0  0.0 - 0.5 K/UL Final   ??? DF 01/15/2020 AUTOMATED    Final   ??? Sodium 01/15/2020 141  136 - 145 mmol/L Final   ??? Potassium 01/15/2020 3.4* 3.5 - 5.1 mmol/L Final   ??? Chloride 01/15/2020 105  98 - 107 mmol/L Final   ??? CO2 01/15/2020 29  21 - 32 mmol/L Final   ??? Anion gap 01/15/2020 7  7 - 16 mmol/L Final   ??? Glucose 01/15/2020 92  65 - 100 mg/dL Final    Comment: 47 - 60 mg/dl Consistent with, but not fully diagnostic of hypoglycemia.  101 - 125 mg/dl Impaired fasting glucose/consistent with pre-diabetes mellitus  > 126 mg/dl Fasting glucose consistent with overt diabetes mellitus     ??? BUN 01/15/2020 28* 8 - 23 MG/DL Final   ??? Creatinine 01/15/2020 0.88  0.6 - 1.0 MG/DL Final   ??? GFR est AA 01/15/2020 >60  >60 ml/min/1.41m Final   ??? GFR est non-AA 01/15/2020 >60  >60 ml/min/1.752mFinal    Comment: (NOTE)  Estimated GFR is calculated using the Modification of Diet in Renal   Disease (MDRD) Study equation, reported for both African Americans   (GFRAA) and non-African Americans (GFRNA), and normalized to 1.7367m body surface area. The physician must decide which value applies to   the patient. The MDRD study equation should only be used in   individuals age 21 19 older. It has not been validated for the   following: pregnant women, patients  with serious comorbid conditions,   or on certain medications, or persons with extremes of body size,   muscle mass, or nutritional status.     ??? Calcium 01/15/2020 10.0  8.3 - 10.4 MG/DL Final   ??? Bilirubin, total 01/15/2020 0.6  0.2 - 1.1 MG/DL Final   ??? ALT (SGPT) 01/15/2020 18  12 - 65 U/L Final   ??? AST (SGOT) 01/15/2020 18  15 - 37 U/L Final   ??? Alk. phosphatase 01/15/2020 89  50 - 130 U/L Final   ??? Protein, total 01/15/2020 8.7* 6.3 - 8.2 g/dL Final   ??? Albumin 01/15/2020 4.3  3.2 - 4.6 g/dL Final   ??? Globulin 01/15/2020 4.4* 2.3 - 3.5 g/dL Final   ??? A-G Ratio 01/15/2020 1.0* 1.2 - 3.5   Final   ??? Lactic acid 01/15/2020 0.9  0.4 - 2.0 MMOL/L Final   ??? Ventricular Rate 01/15/2020 82  BPM Final   ??? Atrial Rate 01/15/2020 82  BPM Final   ??? P-R Interval 01/15/2020 162  ms Final   ??? QRS Duration 01/15/2020 84  ms Final   ??? Q-T Interval 01/15/2020 380  ms Final   ??? QTC Calculation (Bezet) 01/15/2020 443  ms Final   ??? Calculated P Axis 01/15/2020 83  degrees Final   ??? Calculated R Axis 01/15/2020 35  degrees Final   ??? Calculated T Axis 01/15/2020 75  degrees Final   ??? Diagnosis 01/15/2020    Final                    Value:Normal sinus rhythm  Biatrial enlargement  Left ventricular hypertrophy  Anteroseptal infarct , age undetermined  Abnormal ECG  No previous ECGs available  Confirmed by GRABARCZYK  MD ([facility]), Tabbitha Janvrin A ([id number]) on 01/15/2020 6:45:08 PM     ??? Color 01/15/2020 YELLOW    Final   ??? Appearance 01/15/2020 CLEAR    Final   ??? Specific gravity 01/15/2020 1.009  1.001 - 1.023   Final   ??? pH (UA) 01/15/2020 6.5  5.0 - 9.0   Final   ??? Protein 01/15/2020 Negative  NEG mg/dL Final   ??? Glucose 01/15/2020 Negative  mg/dL Final   ??? Ketone 01/15/2020 Negative  NEG mg/dL Final   ??? Bilirubin 01/15/2020 Negative  NEG   Final   ??? Blood 01/15/2020 Negative  NEG   Final   ??? Urobilinogen 01/15/2020 0.2  0.2 - 1.0 EU/dL Final   ??? Nitrites 01/15/2020 Negative  NEG   Final   ??? Leukocyte Esterase 01/15/2020 Negative  NEG   Final   ???  Glucose (POC) 01/15/2020 94  65 - 100 mg/dL Final    Comment: 47 - 60 mg/dl Consistent with, but not fully diagnostic of hypoglycemia.  101 - 125 mg/dl Impaired fasting glucose/consistent with pre-diabetes mellitus  > 126 mg/dl Fasting glucose consistent with overt diabetes mellitus     ??? Performed by 01/15/2020 [email or web address]   Final         ASSESSMENT and PLAN    ICD-10-CM ICD-9-CM    1. Vitamin D deficiency  E55.9 268.9 VITAMIN D, 25 HYDROXY      VITAMIN B12      TSH 3RD GENERATION   2. Anemia, unspecified type  D64.9 285.9 VITAMIN D, 25 HYDROXY      VITAMIN B12      TSH 3RD GENERATION   3. Memory loss, short term  R41.3 780.93 VITAMIN D, 25 HYDROXY      VITAMIN B12      TSH 3RD GENERATION      REFERRAL TO NEUROLOGY   4. Pneumonia due to infectious organism, unspecified laterality, unspecified part of lung  J18.9 486 cefTRIAXone (ROCEPHIN) injection 500 mg   5. 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 Vitamin D deficiency  -     VITAMIN D, 25 HYDROXY  -     VITAMIN B12  -     TSH 3RD GENERATION    2. Anemia, unspecified type  -     VITAMIN D, 25 HYDROXY  -     VITAMIN B12  -     TSH 3RD GENERATION    3. Memory loss, short term  -     VITAMIN D, 25 HYDROXY  -     VITAMIN B12  -     TSH 3RD GENERATION  -     REFERRAL TO NEUROLOGY    4. Pneumonia due to infectious organism, unspecified laterality, unspecified part of lung  -     cefTRIAXone (ROCEPHIN) injection 500 mg    5. 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

## 2020-01-17 NOTE — Progress Notes (Signed)
Per Dr. Cliffton Asters, Notify husband and daughter , she needs b-12 shot q week x 3 , vit D 2000 every day and we should have already made the referral to Neuro , please check---Called patient's daughter and didn't get an answer, called and spoke with patient's husband and went over everything. He says they are waiting on a call from neuro to set up that appointment.

## 2020-01-18 LAB — TSH 3RD GENERATION
TSH: 1.9 u[IU]/mL (ref 0.450–4.500)
TSH: 1.9 u[IU]/mL (ref 0.450–4.500)

## 2020-01-18 LAB — VITAMIN D 25 HYDROXY: Vit D, 25-Hydroxy: 28.9 ng/mL — ABNORMAL LOW (ref 30.0–100.0)

## 2020-01-18 LAB — VITAMIN B12
Vitamin B-12: 354 pg/mL (ref 232–1245)
Vitamin B12: 354 pg/mL (ref 232–1245)

## 2020-01-18 LAB — VITAMIN D, 25 HYDROXY: VITAMIN D, 25-HYDROXY: 28.9 ng/mL — ABNORMAL LOW (ref 30.0–100.0)

## 2020-01-24 ENCOUNTER — Ambulatory Visit: Admit: 2020-01-24 | Discharge: 2020-01-24 | Payer: MEDICARE | Primary: Family Medicine

## 2020-01-24 ENCOUNTER — Ambulatory Visit: Primary: Family Medicine

## 2020-01-24 DIAGNOSIS — E538 Deficiency of other specified B group vitamins: Secondary | ICD-10-CM

## 2020-01-24 MED ORDER — CYANOCOBALAMIN 1,000 MCG/ML IJ SOLN
1000 mcg/mL | Freq: Once | INTRAMUSCULAR | Status: AC
Start: 2020-01-24 — End: 2020-01-24
  Administered 2020-01-24: 14:00:00 via INTRAMUSCULAR

## 2020-01-30 ENCOUNTER — Ambulatory Visit: Admit: 2020-01-30 | Discharge: 2020-01-30 | Payer: MEDICARE | Primary: Family Medicine

## 2020-01-30 ENCOUNTER — Ambulatory Visit: Primary: Family Medicine

## 2020-01-30 DIAGNOSIS — E538 Deficiency of other specified B group vitamins: Secondary | ICD-10-CM

## 2020-01-30 MED ORDER — CYANOCOBALAMIN 1,000 MCG/ML IJ SOLN
1000 mcg/mL | Freq: Once | INTRAMUSCULAR | Status: AC
Start: 2020-01-30 — End: 2020-01-30
  Administered 2020-01-30: 14:00:00 via INTRAMUSCULAR

## 2020-01-30 NOTE — Progress Notes (Signed)
Here for second B12 injection only. Husband at side. Tolerated well.

## 2020-01-30 NOTE — Progress Notes (Signed)
Here for second B12 injection only. Husband at side. Tolerated well.

## 2020-02-07 ENCOUNTER — Ambulatory Visit: Admit: 2020-02-07 | Discharge: 2020-02-07 | Payer: MEDICARE | Primary: Family Medicine

## 2020-02-07 ENCOUNTER — Ambulatory Visit: Primary: Family Medicine

## 2020-02-07 DIAGNOSIS — E538 Deficiency of other specified B group vitamins: Secondary | ICD-10-CM

## 2020-02-07 MED ORDER — CYANOCOBALAMIN 1,000 MCG/ML IJ SOLN
1000 mcg/mL | Freq: Once | INTRAMUSCULAR | Status: AC
Start: 2020-02-07 — End: 2020-02-07
  Administered 2020-02-07: 13:00:00 via INTRAMUSCULAR

## 2020-02-08 ENCOUNTER — Encounter

## 2020-02-10 NOTE — Telephone Encounter (Signed)
Telephone Encounter by Corlis Leak at 02/10/20 1111                Author: Corlis Leak  Service: --  Author Type: Registered Nurse       Filed: 02/10/20 1111  Encounter Date: 02/08/2020  Status: Signed          Editor: McTaggart, Shireen Quan (Registered Nurse)          From: Wilburn Mylar MaynardTo: Jeanelle Malling, MDSent: 02/08/2020  8:22 AM EDTSubject: Non-Urgent Medical QuestionMy mom needs appointment for 4 week  follow  up chest X-ray , seen in ED May 12,2021, can you schedule that for [place]?Thanks Dara Hoyer

## 2020-02-17 ENCOUNTER — Encounter

## 2020-02-17 ENCOUNTER — Inpatient Hospital Stay: Admit: 2020-02-17 | Payer: MEDICARE | Primary: Family Medicine

## 2020-02-17 DIAGNOSIS — Z8709 Personal history of other diseases of the respiratory system: Secondary | ICD-10-CM

## 2020-02-17 NOTE — Progress Notes (Signed)
Sent mychart message

## 2020-03-16 ENCOUNTER — Encounter

## 2020-03-19 NOTE — Telephone Encounter (Signed)
Telephone Encounter by Corlis Leak at 03/19/20 1056                Author: Corlis Leak  Service: --  Author Type: Registered Nurse       Filed: 03/19/20 1056  Encounter Date: 03/16/2020  Status: Signed          Editor: McTaggart, Shireen Quan (Registered Nurse)          From: Wilburn Mylar MaynardTo: Jeanelle Malling, MDSent: 03/16/2020  8:47 AM EDTSubject: Non-Urgent Medical QuestionHi Wanted to follow up so Mom had CXR June  14 that showed improvement, does she need another? Also she received B12 injections in June, does she need an oral vitamin B12 ?Also my parents reluctant to accept help, any way you can write a script for 4 hours per week of respite for Dad since he is  taking care of Mom ? Her neurologist appointment is coming up August 19Thanks so much Melanie Houston

## 2020-04-23 ENCOUNTER — Ambulatory Visit: Admit: 2020-04-23 | Discharge: 2020-04-23 | Payer: MEDICARE | Attending: Neurology | Primary: Family Medicine

## 2020-04-23 ENCOUNTER — Inpatient Hospital Stay: Admit: 2020-04-23 | Payer: MEDICARE | Primary: Family Medicine

## 2020-04-23 ENCOUNTER — Ambulatory Visit: Attending: Neurology | Primary: Family Medicine

## 2020-04-23 DIAGNOSIS — F0280 Dementia in other diseases classified elsewhere without behavioral disturbance: Secondary | ICD-10-CM

## 2020-04-23 DIAGNOSIS — G309 Alzheimer's disease, unspecified: Secondary | ICD-10-CM

## 2020-04-23 MED ORDER — MEMANTINE 10 MG TAB
10 mg | ORAL_TABLET | Freq: Two times a day (BID) | ORAL | 4 refills | Status: AC
Start: 2020-04-23 — End: ?

## 2020-04-23 NOTE — Progress Notes (Signed)
[facility] NEUROLOGY  [address], [address]  [place], [place] [address]  Phone: [phone] Fax [phone]  Dr. Amador Cunas      04/23/2020  Brett Canales     Patient is referred by the following provider for consultation regarding as below:       I reviewed the available records and notes and have examined patient with the following findings:     Chief Complaint:  Chief Complaint   Patient presents with   ??? Establish Care   ??? Memory Loss          HPI: This is a   right handed 86 y.o. Married  female here with her daughter and her husband.  Her daughter is the primary story and her husband is somewhat reluctant to speak.  The patient is somewhat abrasive and in significant denial as to the severity of her problem or that she even has a problem.  The history begins about 3 years ago when she started noticing the family started noticing progressive memory problems.  Where she was repeating questions over and over again.  That is progressively gotten worse.  The patient denies all of this.  Her long-term memory is definitely more retained she can tell stories from the old days.  She cannot remember appointments, she is incapable of taking care of any kind of bills, she certainly cannot shop independently.  She is no longer able to drive but that was for ocular reasons.  Glaucoma.  She really does not take her medications on her own her husband helps dispense them probably more than what he would list.  She has an enormous amount of anxiety she is not recognizing certainly family is in an unfamiliar friends.  The family actually started noticing symptoms about 6 years ago but about 3 years ago they actually reached out because they were concerned.  She did have a CAT scan done approximately 4 months ago 3 months ago showing no ventricular enlargement.  We do not have any family history with her.  She does take care of her bathroom details to the best that we can tell.  She also can dress herself but  she does dress herself in the same clothing every time.    IMAGING REVIEW:  I REVIEWED PERTINENT  IMAGES AND REPORTS WITH THE PATIENT PERSONALLY, DIRECTLY AND FULLY.     Past Medical History:  Past Medical History:   Diagnosis Date   ??? Arthritis     osteo-arthritis   ??? Cancer (HCC)     Bilateral Breast Cancer   ??? Chronic kidney disease     followed by Dr. Dora Sims   ??? Chronic UTI     Followed by Dr. Elease Hashimoto, chronic macrobid   ??? Colon polyps    ??? Glaucoma    ??? Hematuria     followed by Dr. Thurston Hole   ??? Hypertension    ??? Osteoporosis     followed by Dr. Odis Luster   ??? Thyroid disease     low TSH followed by Dr. Mickie Kay   ??? Vitamin D deficiency        Past Surgical History:  Past Surgical History:   Procedure Laterality Date   ??? HX COLONOSCOPY  2006   ??? HX GYN      hysterectomy   ??? HX HEENT      tonsillectomy   ??? HX MASTECTOMY Bilateral 1988 and 2002   ??? PR COLONOSCOPY W/BIOPSY SINGLE/MULTIPLE  05/17/2017  polyp       Social History:  Social History     Socioeconomic History   ??? Marital status: MARRIED     Spouse name: Not on file   ??? Number of children: Not on file   ??? Years of education: Not on file   ??? Highest education level: Not on file   Occupational History   ??? Not on file   Tobacco Use   ??? Smoking status: Never Smoker   ??? Smokeless tobacco: Never Used   Substance and Sexual Activity   ??? Alcohol use: No     Alcohol/week: 0.0 standard drinks   ??? Drug use: No   ??? Sexual activity: Not on file   Other Topics Concern   ??? Not on file   Social History Narrative   ??? Not on file     Social Determinants of Health     Financial Resource Strain:    ??? Difficulty of Paying Living Expenses:    Food Insecurity:    ??? Worried About Programme researcher, broadcasting/film/video in the Last Year:    ??? Barista in the Last Year:    Transportation Needs:    ??? Freight forwarder (Medical):    ??? Lack of Transportation (Non-Medical):    Physical Activity:    ??? Days of Exercise per Week:    ??? Minutes of Exercise per Session:    Stress:    ??? Feeling of  Stress :    Social Connections:    ??? Frequency of Communication with Friends and Family:    ??? Frequency of Social Gatherings with Friends and Family:    ??? Attends Religious Services:    ??? Database administrator or Organizations:    ??? Attends Engineer, structural:    ??? Marital Status:    Intimate Programme researcher, broadcasting/film/video Violence:    ??? Fear of Current or Ex-Partner:    ??? Emotionally Abused:    ??? Physically Abused:    ??? Sexually Abused:        Family History:   Family History   Problem Relation Age of Onset   ??? Cancer Mother    ??? Heart Disease Father        Current Outpatient Medications on File Prior to Visit   Medication Sig Dispense Refill   ??? b complex vitamins tablet Take 1 Tablet by mouth daily.     ??? latanoprost (XALATAN) 0.005 % ophthalmic solution INSTILL 1 DROP INTO BOTH EYES AT BEDTIME 1 Bottle 11   ??? metoprolol succinate (TOPROL-XL) 100 mg tablet Take 1 Tab by mouth daily. 90 Tab 3   ??? OTHER,NON-FORMULARY, Unknown eye drop for glaucoma     ??? dorzolamide-timolol (COSOPT) 22.3-6.8 mg/mL ophthalmic solution INSTILL 1 DROP INTO BOTH EYES TWICE A DAY  4   ??? cholecalciferol, vitamin D3, 2,000 unit tab Take  by mouth.     ??? ascorbic acid (VITAMIN C) 500 mg tablet Strength: ; Form: ; SIG: take 1 tablet orally Daily     ??? [DISCONTINUED] cholecalciferol (VITAMIN D3) 1,000 unit cap Take  by mouth daily (with dinner).       No current facility-administered medications on file prior to visit.       Allergies   Allergen Reactions   ??? Nsaids (Non-Steroidal Anti-Inflammatory Drug) Other (comments)   ??? Azithromycin Unknown (comments)   ??? Celebrex [Celecoxib] Unknown (comments)   ??? Ibuprofen Unknown (comments)   ??? Penicillins Unknown (comments)   ???  Sulfa (Sulfonamide Antibiotics) Unknown (comments)   ??? Tetracyclines Unknown (comments)       Review of Systems:  Review of Systems   Constitutional: Negative.    HENT: Negative.    Eyes: Negative.    Respiratory: Negative.    Cardiovascular: Negative.    Gastrointestinal: Negative.     Genitourinary: Negative.    Musculoskeletal: Negative.    Skin: Negative.    Psychiatric/Behavioral: Positive for memory loss.     Fall Risk Assessment, last 12 mths 11/14/2019   Able to walk? Yes   Fall in past 12 months? 0   Do you feel unsteady? 0   Are you worried about falling 0     No flowsheet data found.       Vitals:    04/23/20 0948   BP: (!) 205/103   Pulse: 70   Weight: 83 lb (37.6 kg)   Height: 5' 3.25" (1.607 m)        Physical Exam  Constitutional:       Appearance: Normal appearance.   HENT:      Head: Normocephalic and atraumatic.   Eyes:      Extraocular Movements: Extraocular movements intact and EOM normal.   Cardiovascular:      Rate and Rhythm: Normal rate and regular rhythm.      Pulses: Normal pulses.   Pulmonary:      Effort: Pulmonary effort is normal.   Neurological:      Mental Status: She is alert.      Gait: Gait is intact.      Deep Tendon Reflexes:      Reflex Scores:       Tricep reflexes are 2+ on the right side and 2+ on the left side.       Bicep reflexes are 2+ on the right side and 2+ on the left side.       Brachioradialis reflexes are 2+ on the right side and 2+ on the left side.       Patellar reflexes are 2+ on the right side and 2+ on the left side.       Achilles reflexes are 2+ on the right side and 2+ on the left side.        Neurologic Exam     Mental Status   Attention: decreased. Concentration: decreased.   Level of consciousness: alert  Knowledge: poor.   Through the entire evaluation within moments after conversing with her she would repeat questions continuously.  She does not know the year the month she does not know the season she does not know the president she guessed at the day of the week and happen to get a right is Thursday but she even admittedly stated she gassed.  She could not draw a box she cannot draw a clock she could remember 3 of 3 objects immediately but 03 objects within a few minutes.     Cranial Nerves     CN II   Visual fields full to  confrontation.     CN III, IV, VI   Extraocular motions are normal.     CN VII   Facial expression full, symmetric.     Motor Exam   Muscle bulk: normal  Right arm tone: normal  Left arm tone: normal  Right leg tone: normal  Left leg tone: normal    Gait, Coordination, and Reflexes     Gait  Gait: normal    Tremor   Resting tremor:  absent  Intention tremor: absent  Action tremor: absent    Reflexes   Right brachioradialis: 2+  Left brachioradialis: 2+  Right biceps: 2+  Left biceps: 2+  Right triceps: 2+  Left triceps: 2+  Right patellar: 2+  Left patellar: 2+  Right achilles: 2+  Left achilles: 2+          Assessment   Assessment / Plan:    Diagnoses and all orders for this visit:    1. Alzheimer's dementia without behavioral disturbance, unspecified timing of dementia onset (HCC) at this time the patient has severe severely advanced.  She cannot survive without full care.  She repeats within seconds to minutes.  And has no appreciation to how for far she is gone.  Unfortunately she is somewhat stubborn and resistant and I feel that they are going to have a hard time with her medications but nonetheless should we are to start her on Namenda 10 twice a day I wrote on a piece of paper for the husband who dispenses and helps with the medication to take half in the morning half at night.  That is only for 1 month and then a full 1 twice a day.  Her blood pressure did run 205/103 in the office today.  The blood pressure cuff is way too big for her arm her arm needs a small pediatric cuff.  They apparently have one at home I asked them to recheck at home her blood pressure if it still elevated go to the primary physician.  They are willing to accept this.        The Diagnosis and differential diagnostic considerations, and Rx Tx were reviewed with the patient at length.           No orders of the defined types were placed in this encounter.        I have spent greater than 50% of visit discussing and counseling of  patient  for treatment and diagnostic plan review. Total time 40 min     .      Notes: Patient is to continue all medications as directed by prescribing physicians. Continuations on today's visit are made based on the patient's report of current medications.             Dr. Amador Cunas  Consultation Neurology, Neurodiagnostics and Neurotherapeutics  Neuroelectrophysiology, EEG, EMG  [facility]  [address]  [place], [place] [address]  Phone:  [phone]  Fax:   [phone]

## 2020-04-24 ENCOUNTER — Encounter

## 2020-04-24 NOTE — Telephone Encounter (Signed)
Notified husband, placed MRI order.

## 2020-04-27 NOTE — Progress Notes (Signed)
 Initial Contact Social Work Note - Ambulatory  04/27/2020      Date of referral: 04/27/20  Referral received from: PCP  Reason for referral: Support with additional help at home and Dementia diagnosis     Previous SW Referral: No    If yes, brief summary and outcome:      Two Identifiers Verified: Yes    Insurance Provider: Medicare    Support System: Adult Child/Children     Veteran Status: N/A    Community Providers: None at this time but they are looking into finding additional help at home. They also have  Long-term Care insurance      ADL Assistance: Dressing and Eating    Housing/Living Concerns or Home Modification Needs: No    Transportation Concern: No    Medication Cost Concern: No    Medication Education or Adherence Concern: No    Financial Concern(s): No    Income (only if applicable): No    Ability to Read/Write: Yes    Advance Care Plan:  Not on file; education provided    Other:    Identified Needs:     . Additional help at home, since patient's dementia diagnosis.  . Finding in home personal care.      Social Work Plan:    . Referral to [facility]  . Referral to [facility]  . Next Steps: family to review the Long-term Care Policy and move forward.      Goals Addressed                 This Visit's Progress    . Supportive resources in place to maintain patient in the community (ie. Home Health, DME equipment, refer to, medication assistant plan, etc.)        Patients family to find in home personal care. By 06/03/2020.          CC: Teresa Oneil DASEN, MD

## 2020-05-04 NOTE — Telephone Encounter (Signed)
Telephone Encounter by Maury Dus, CMA at 05/04/20 9678                Author: Maury Dus, CMA  Service: --  Author Type: Medical Assistant       Filed: 05/04/20 0837  Encounter Date: 05/01/2020  Status: Signed          Editor: Maury Dus, CMA (Medical Assistant)          From: Thera Flake: Sheryle Spray, DOSent: 05/01/2020  3:00 PM EDTSubject: Non-Urgent Medical QuestionHi   Wanted to see if we could get referral  for Paliative Care nurse visit for mom weekly given her diagnosis is this something you can help with ? Thanks so much [employer]

## 2020-05-07 NOTE — Progress Notes (Signed)
I spoke with the daughter #unfortunately the patient is advancing and getting more difficult and certainly her father the patient's husband cannot control her or take care of her.  He is in the hospital with sepsis and the patient does not even acknowledge that.  So there can a try placement for her and I think that is a great choice.

## 2020-05-19 NOTE — Progress Notes (Signed)
 Completed [facility] form available for pick up.  [name] made aware and will come by the office to pick up.    Form placed in envelope with patient name and DOB with front office staff.  Copy of form sent to scanning Dept.    Will close this encounter.

## 2020-05-20 ENCOUNTER — Ambulatory Visit: Admit: 2020-05-20 | Discharge: 2020-05-20 | Payer: MEDICARE | Attending: Family Medicine | Primary: Family Medicine

## 2020-05-20 ENCOUNTER — Ambulatory Visit: Attending: Family Medicine | Primary: Family Medicine

## 2020-05-20 DIAGNOSIS — Z23 Encounter for immunization: Secondary | ICD-10-CM

## 2020-05-20 LAB — BASIC METABOLIC PANEL
BUN: 29 — AB (ref 4–21)
CO2: 27 — AB (ref 13–22)
Chloride: 107 (ref 99–108)
Creatinine: 1 (ref 0.5–1.1)
Glucose: 82
Potassium: 4 (ref 3.4–5.3)
Sodium: 144 (ref 137–147)

## 2020-05-20 LAB — CBC AND DIFFERENTIAL
HCT: 45 (ref 36–46)
Hemoglobin: 14.7 (ref 12.0–16.0)
Platelets: 231 (ref 150–399)
WBC: 7.9

## 2020-05-20 LAB — HEPATIC FUNCTION PANEL
ALT: 16 (ref 7–35)
AST: 16 (ref 13–35)
Alkaline Phosphatase: 71 (ref 25–125)
Bilirubin, Total: 0.5

## 2020-05-20 LAB — VITAMIN B12: Vitamin B-12: 495

## 2020-05-20 LAB — CBC: RBC: 4.66 (ref 3.87–5.11)

## 2020-05-20 LAB — COMPREHENSIVE METABOLIC PANEL
Albumin: 4.5 (ref 3.5–5.0)
Calcium: 9.5 (ref 8.7–10.7)
Globulin: 2.6

## 2020-05-20 LAB — VITAMIN D 25 HYDROXY (VIT D DEFICIENCY, FRACTURES): Vit D, 25-Hydroxy: 27.4

## 2020-05-20 NOTE — ACP (Advance Care Planning) (Signed)
Advance Care Planning     General Advance Care Planning (ACP) Conversation      Date of Conversation: 05/20/2020  Conducted with: Healthcare Decision Maker: Named in Futures trader of [employer] Decision Maker:   No healthcare decision makers have been documented.   Click here to complete Clinical research associate of the Environmental health practitioner Relationship (ie "Primary")    Today we documented Decision Maker(s). The patient will provide ACP documents.    Content/Action Overview:   Has ACP document(s) NOT on file - requested patient to provide  Reviewed DNR/DNI and patient elects Full Code (Attempt Resuscitation)  Topics discussed: n/a  Additional Comments: n/a     Length of Voluntary ACP Conversation in minutes:  <16 minutes (Non-Billable)    Melanie Ganser, MA

## 2020-05-20 NOTE — Progress Notes (Signed)
Per Dr. White, Notify normal --Notified via Mychart message

## 2020-05-20 NOTE — Progress Notes (Signed)
This is the Subsequent Medicare Annual Wellness Exam, performed 12 months or more after the Initial AWV or the last Subsequent AWV    I have reviewed the patient's medical history in detail and updated the computerized patient record.       Assessment/Plan   Education and counseling provided:  Are appropriate based on today's review and evaluation    1. Encounter for immunization  -     AMB POC TUBERCULOSIS, INTRADERMAL (SKIN TEST)  2. Medicare annual wellness visit, subsequent  -     PR ANNUAL ALCOHOL SCREEN 15 MIN  -     URINALYSIS W/ RFLX MICROSCOPIC  3. Screening for alcoholism  -     PR ANNUAL ALCOHOL SCREEN 15 MIN  4. Screening for depression  -     DEPRESSION SCREEN ANNUAL  5. Essential hypertension  -     CBC WITH AUTOMATED DIFF  -     METABOLIC PANEL, COMPREHENSIVE  6. Thyroid disease  -     CBC WITH AUTOMATED DIFF  7. Osteoporosis, unspecified osteoporosis type, unspecified pathological fracture presence  -     CBC WITH AUTOMATED DIFF  8. Vitamin D deficiency  -     VITAMIN B12  -     VITAMIN D, 25 HYDROXY  9. Anemia, unspecified type  -     CBC WITH AUTOMATED DIFF  10. Protein-calorie malnutrition, unspecified severity (HCC)   -     VITAMIN B12       Depression Risk Factor Screening     3 most recent PHQ Screens 05/20/2020   PHQ Not Done -   Little interest or pleasure in doing things Not at all   Feeling down, depressed, irritable, or hopeless Not at all   Total Score PHQ 2 0   Trouble falling or staying asleep, or sleeping too much -   Feeling tired or having little energy -   Poor appetite, weight loss, or overeating -   Feeling bad about yourself - or that you are a failure or have let yourself or your family down -   Trouble concentrating on things such as school, work, reading, or watching TV -   Moving or speaking so slowly that other people could have noticed; or the opposite being so fidgety that others notice -   Thoughts of being better off dead, or hurting yourself in some way -   PHQ 9 Score -    How difficult have these problems made it for you to do your work, take care of your home and get along with others -       Alcohol Risk Screen   Do you average more than 1 drink per night or more than 7 drinks a week?: (P) No  On any one occasion in the past three months have you had more than 3 drinks containing alcohol?: (P) No          Functional Ability and Level of Safety   Hearing:  Hearing: (P) Patient reports hearing is good      Activities of Daily Living:  The home contains: (P) grab bars  Functional ADLs: (P) Patient needs help with self care  Patient needs help with: (P) phone, transportation, shopping, preparing meals, managing money, hygiene     Ambulation:  Patient ambulates: (P) with no difficulty     Fall Risk:  Fall Risk Assessment, last 12 mths 05/20/2020   Able to walk? Yes   Fall in past 12  months? 0   Do you feel unsteady? 0   Are you worried about falling 0     Abuse Screen:  Do you ever feel afraid of your partner?: (P) No  Are you in a relationship with someone who physically or mentally threatens you?: (P) No  Is it safe for you to go home?: (!) (P) No        Cognitive Screening   Has your family/caregiver stated any concerns about your memory?: (P) Yes     Cognitive Screening: Abnormal - Clock Drawing Test    Health Maintenance Due     Health Maintenance Due   Topic Date Due   ??? DTaP/Tdap/Td series (1 - Tdap) Never done   ??? Shingrix Vaccine Age 84> (1 of 2) Never done   ??? Flu Vaccine (1) 05/06/2020       Patient Care Team   Patient Care Team:  Jeanelle Malling, MD as PCP - General (Family Medicine)  Jeanelle Malling, MD as PCP - [facility] Empaneled Provider  Sim Boast as Ambulatory Care Manager    History     Patient Active Problem List   Diagnosis Code   ??? Thyroid disease E07.9   ??? Hypertension I10   ??? Chronic kidney disease N18.9   ??? Cancer (HCC) C80.1   ??? Osteoporosis M81.0   ??? Arthritis M19.90   ??? Vitamin D deficiency E55.9   ??? Glaucoma H40.9     Past Medical History:   Diagnosis Date    ??? Arthritis     osteo-arthritis   ??? Cancer (HCC)     Bilateral Breast Cancer   ??? Chronic kidney disease     followed by Dr. Dora Sims   ??? Chronic UTI     Followed by Dr. Elease Hashimoto, chronic macrobid   ??? Colon polyps    ??? Glaucoma    ??? Hematuria     followed by Dr. Thurston Hole   ??? Hypertension    ??? Osteoporosis     followed by Dr. Odis Luster   ??? Thyroid disease     low TSH followed by Dr. Mickie Kay   ??? Vitamin D deficiency       Past Surgical History:   Procedure Laterality Date   ??? HX COLONOSCOPY  2006   ??? HX GYN      hysterectomy   ??? HX HEENT      tonsillectomy   ??? HX MASTECTOMY Bilateral 1988 and 2002   ??? PR COLONOSCOPY W/BIOPSY SINGLE/MULTIPLE  05/17/2017    polyp     Current Outpatient Medications   Medication Sig Dispense Refill   ??? b complex vitamins tablet Take 1 Tablet by mouth daily.     ??? memantine (Namenda) 10 mg tablet Take 1 Tablet by mouth two (2) times a day. 180 Tablet 4   ??? latanoprost (XALATAN) 0.005 % ophthalmic solution INSTILL 1 DROP INTO BOTH EYES AT BEDTIME 1 Bottle 11   ??? metoprolol succinate (TOPROL-XL) 100 mg tablet Take 1 Tab by mouth daily. 90 Tab 3   ??? OTHER,NON-FORMULARY, Unknown eye drop for glaucoma     ??? dorzolamide-timolol (COSOPT) 22.3-6.8 mg/mL ophthalmic solution INSTILL 1 DROP INTO BOTH EYES TWICE A DAY  4   ??? cholecalciferol, vitamin D3, 2,000 unit tab Take  by mouth.     ??? LORazepam (ATIVAN) 0.5 mg tablet Take 1 Tablet by mouth every eight (8) hours as needed for Anxiety. Max Daily Amount: 1.5 mg. 12 Tablet 0  Allergies   Allergen Reactions   ??? Nsaids (Non-Steroidal Anti-Inflammatory Drug) Other (comments)   ??? Azithromycin Unknown (comments)   ??? Celebrex [Celecoxib] Unknown (comments)   ??? Ibuprofen Unknown (comments)   ??? Penicillins Unknown (comments)   ??? Sulfa (Sulfonamide Antibiotics) Unknown (comments)   ??? Tetracyclines Unknown (comments)       Family History   Problem Relation Age of Onset   ??? Cancer Mother    ??? Heart Disease Father      Social History     Tobacco Use   ??? Smoking  status: Never Smoker   ??? Smokeless tobacco: Never Used   Substance Use Topics   ??? Alcohol use: No     Alcohol/week: 0.0 standard drinks         Jeanelle Malling, MD

## 2020-05-21 LAB — CBC WITH AUTO DIFFERENTIAL
Basophils %: 1 %
Basophils Absolute: 0.1 10*3/uL (ref 0.0–0.2)
Eosinophils %: 1 %
Eosinophils Absolute: 0.1 10*3/uL (ref 0.0–0.4)
Granulocyte Absolute Count: 0 10*3/uL (ref 0.0–0.1)
Hematocrit: 44.6 % (ref 34.0–46.6)
Hemoglobin: 14.7 g/dL (ref 11.1–15.9)
Immature Granulocytes: 1 %
Lymphocytes %: 17 %
Lymphocytes Absolute: 1.4 10*3/uL (ref 0.7–3.1)
MCH: 31.5 pg (ref 26.6–33.0)
MCHC: 33 g/dL (ref 31.5–35.7)
MCV: 96 fL (ref 79–97)
Monocytes %: 9 %
Monocytes Absolute: 0.7 10*3/uL (ref 0.1–0.9)
Neutrophils %: 71 %
Neutrophils Absolute: 5.7 10*3/uL (ref 1.4–7.0)
Platelets: 231 10*3/uL (ref 150–450)
RBC: 4.66 x10E6/uL (ref 3.77–5.28)
RDW: 12.7 % (ref 11.7–15.4)
WBC: 7.9 10*3/uL (ref 3.4–10.8)

## 2020-05-21 LAB — URINALYSIS W/ RFLX MICROSCOPIC
Bilirubin, Urine: NEGATIVE
Bilirubin: NEGATIVE
Blood, Urine: NEGATIVE
Blood: NEGATIVE
Glucose, UA: NEGATIVE
Glucose: NEGATIVE
Ketone: NEGATIVE
Ketones, Urine: NEGATIVE
Leukocyte Esterase, Urine: NEGATIVE
Leukocyte Esterase: NEGATIVE
Nitrite, Urine: NEGATIVE
Nitrites: NEGATIVE
Specific Gravity, UA: 1.016 NA (ref 1.005–1.030)
Specific Gravity: 1.016 (ref 1.005–1.030)
Urobilinogen, Urine: 0.2 mg/dL (ref 0.2–1.0)
Urobilinogen: 0.2 mg/dL (ref 0.2–1.0)
pH (UA): 6.5 (ref 5.0–7.5)
pH, UA: 6.5 NA (ref 5.0–7.5)

## 2020-05-21 LAB — COMPREHENSIVE METABOLIC PANEL
ALT: 16 IU/L (ref 0–32)
AST: 16 IU/L (ref 0–40)
Albumin/Globulin Ratio: 1.6 NA (ref 1.2–2.2)
Albumin: 4.2 g/dL (ref 3.6–4.6)
Alkaline Phosphatase: 71 IU/L (ref 44–121)
BUN: 29 mg/dL — ABNORMAL HIGH (ref 8–27)
Bun/Cre Ratio: 30 NA — ABNORMAL HIGH (ref 12–28)
CO2: 27 mmol/L (ref 20–29)
Calcium: 9.5 mg/dL (ref 8.7–10.3)
Chloride: 107 mmol/L — ABNORMAL HIGH (ref 96–106)
Creatinine: 0.97 mg/dL (ref 0.57–1.00)
EGFR IF NonAfrican American: 54 mL/min/{1.73_m2} — ABNORMAL LOW (ref >59)
GFR African American: 62 mL/min/{1.73_m2} (ref >59)
Globulin, Total: 2.6 g/dL (ref 1.5–4.5)
Glucose: 82 mg/dL (ref 65–99)
Potassium: 4 mmol/L (ref 3.5–5.2)
Sodium: 144 mmol/L (ref 134–144)
Total Bilirubin: 0.5 mg/dL (ref 0.0–1.2)
Total Protein: 6.8 g/dL (ref 6.0–8.5)

## 2020-05-21 LAB — VITAMIN B12
Vitamin B-12: 495 pg/mL (ref 232–1245)
Vitamin B12: 495 pg/mL (ref 232–1245)

## 2020-05-21 LAB — VITAMIN D 25 HYDROXY: Vit D, 25-Hydroxy: 27.4 ng/mL — ABNORMAL LOW (ref 30.0–100.0)

## 2020-05-21 LAB — METABOLIC PANEL, COMPREHENSIVE
A-G Ratio: 1.6 (ref 1.2–2.2)
ALT (SGPT): 16 IU/L (ref 0–32)
AST (SGOT): 16 IU/L (ref 0–40)
Albumin: 4.2 g/dL (ref 3.6–4.6)
Alk. phosphatase: 71 IU/L (ref 44–121)
BUN/Creatinine ratio: 30 — ABNORMAL HIGH (ref 12–28)
BUN: 29 mg/dL — ABNORMAL HIGH (ref 8–27)
Bilirubin, total: 0.5 mg/dL (ref 0.0–1.2)
CO2: 27 mmol/L (ref 20–29)
Calcium: 9.5 mg/dL (ref 8.7–10.3)
Chloride: 107 mmol/L — ABNORMAL HIGH (ref 96–106)
Creatinine: 0.97 mg/dL (ref 0.57–1.00)
GFR est AA: 62 mL/min/{1.73_m2} (ref >59)
GFR est non-AA: 54 mL/min/{1.73_m2} — ABNORMAL LOW (ref >59)
GLOBULIN, TOTAL: 2.6 g/dL (ref 1.5–4.5)
Glucose: 82 mg/dL (ref 65–99)
Potassium: 4 mmol/L (ref 3.5–5.2)
Protein, total: 6.8 g/dL (ref 6.0–8.5)
Sodium: 144 mmol/L (ref 134–144)

## 2020-05-21 LAB — CBC WITH AUTOMATED DIFF
ABS. BASOPHILS: 0.1 10*3/uL (ref 0.0–0.2)
ABS. EOSINOPHILS: 0.1 10*3/uL (ref 0.0–0.4)
ABS. IMM. GRANS.: 0 10*3/uL (ref 0.0–0.1)
ABS. MONOCYTES: 0.7 10*3/uL (ref 0.1–0.9)
ABS. NEUTROPHILS: 5.7 10*3/uL (ref 1.4–7.0)
Abs Lymphocytes: 1.4 10*3/uL (ref 0.7–3.1)
BASOPHILS: 1 %
EOSINOPHILS: 1 %
HCT: 44.6 % (ref 34.0–46.6)
HGB: 14.7 g/dL (ref 11.1–15.9)
IMMATURE GRANULOCYTES: 1 %
Lymphocytes: 17 %
MCH: 31.5 pg (ref 26.6–33.0)
MCHC: 33 g/dL (ref 31.5–35.7)
MCV: 96 fL (ref 79–97)
MONOCYTES: 9 %
NEUTROPHILS: 71 %
PLATELET: 231 10*3/uL (ref 150–450)
RBC: 4.66 x10E6/uL (ref 3.77–5.28)
RDW: 12.7 % (ref 11.7–15.4)
WBC: 7.9 10*3/uL (ref 3.4–10.8)

## 2020-05-21 LAB — VITAMIN D, 25 HYDROXY: VITAMIN D, 25-HYDROXY: 27.4 ng/mL — ABNORMAL LOW (ref 30.0–100.0)

## 2020-05-22 ENCOUNTER — Encounter

## 2020-05-22 LAB — AMB POC TUBERCULOSIS, INTRADERMAL (SKIN TEST)
PPD, (POC): NEGATIVE Negative
PPD: NEGATIVE Negative
mm Induration: 0 mm (ref 0–5)
mm Induration: 0 mm (ref 0–5)

## 2020-05-22 NOTE — Telephone Encounter (Signed)
Called patient's daughter and advised that patient start taking vitamin d 1000 international units daily. Patient's daughter verbalized understanding and also wanted to know about patient getting something for anxiety.

## 2020-05-23 MED ORDER — LORAZEPAM 0.5 MG TAB
0.5 mg | ORAL_TABLET | Freq: Three times a day (TID) | ORAL | 0 refills | Status: AC | PRN
Start: 2020-05-23 — End: ?

## 2020-06-12 NOTE — Progress Notes (Signed)
Reached out to patient's daughter for follow-up call. Will try again on Monday.

## 2020-06-17 NOTE — Progress Notes (Signed)
Reached out to patient's daughter for follow-up call. Left voicemail. Will try again on later.

## 2020-06-19 ENCOUNTER — Other Ambulatory Visit: Payer: Self-pay

## 2020-06-19 ENCOUNTER — Ambulatory Visit (INDEPENDENT_AMBULATORY_CARE_PROVIDER_SITE_OTHER): Payer: Medicare Other | Admitting: Nurse Practitioner

## 2020-06-19 ENCOUNTER — Encounter: Payer: Self-pay | Admitting: Nurse Practitioner

## 2020-06-19 VITALS — BP 142/98 | HR 76 | Temp 96.4°F | Ht 64.0 in | Wt 85.8 lb

## 2020-06-19 DIAGNOSIS — F0280 Dementia in other diseases classified elsewhere without behavioral disturbance: Secondary | ICD-10-CM

## 2020-06-19 DIAGNOSIS — E538 Deficiency of other specified B group vitamins: Secondary | ICD-10-CM

## 2020-06-19 DIAGNOSIS — E559 Vitamin D deficiency, unspecified: Secondary | ICD-10-CM

## 2020-06-19 DIAGNOSIS — G309 Alzheimer's disease, unspecified: Secondary | ICD-10-CM

## 2020-06-19 DIAGNOSIS — Z23 Encounter for immunization: Secondary | ICD-10-CM

## 2020-06-19 DIAGNOSIS — I10 Essential (primary) hypertension: Secondary | ICD-10-CM

## 2020-06-19 DIAGNOSIS — M1991 Primary osteoarthritis, unspecified site: Secondary | ICD-10-CM

## 2020-06-19 DIAGNOSIS — N1831 Chronic kidney disease, stage 3a: Secondary | ICD-10-CM

## 2020-06-19 DIAGNOSIS — M810 Age-related osteoporosis without current pathological fracture: Secondary | ICD-10-CM

## 2020-06-19 DIAGNOSIS — Z853 Personal history of malignant neoplasm of breast: Secondary | ICD-10-CM

## 2020-06-19 DIAGNOSIS — R7989 Other specified abnormal findings of blood chemistry: Secondary | ICD-10-CM

## 2020-06-19 MED ORDER — VITAMIN D (ERGOCALCIFEROL) 1.25 MG (50000 UNIT) PO CAPS: 50000.0000 [IU] | ORAL_CAPSULE | ORAL | 0 refills | Status: DC

## 2020-06-19 NOTE — Progress Notes (Signed)
Careteam: Patient Care Team: Lauree Chandler, NP as PCP - General (Geriatric Medicine)  PLACE OF SERVICE:  [place]  Advanced Directive information    Allergies  Allergen Reactions  . Ampicillin   . Arimidex [Anastrozole] Other (See Comments)    Kidney problems   . Azithromycin   . Celecoxib   . Fosamax [Alendronate] Other (See Comments)    Kidney problem   . Ibuprofen   . Naproxen   . Penicillins   . Tetracyclines & Related   . Losartan Rash    Chief Complaint  Patient presents with  . Establish Care    New patient establish care. Patient c/o rash on neck x couple days. Medication confirmed from pharmacy insert, pill bottle not present. Flu vaccine today. Discuss covid vaccine (3rd dose). Here with daughter [name]      HPI: Patient is a 86 y.o. female to establish care.  She recently had awv 05/20/20  She moved from [facility], previous provider was Dr Su Hilt and they were on epic.   Followed by a neurologist there as well.   Hypertension- blood pressure generally in the 120/130 range. Daughter reports bp will be higher at appts. On metoprolol 100 mg daily   vit b def- on supplement  Vit D def- she is not on liquid vit d, 2000 units per drop. Last Vit D level was 27.  Vit b12- previously on injection.   Reports there was some concern of pneumonia, she was treated with antibiotics but area of concern did not completed resolved. It was recommended. Hx of breast cancer.   glaucoma plans to see [place] [facility] to follow up with this.   Dementia- she started on Namenda back in august. She is getting 5 mg daily. They did not make any changes in this due to recent move.   OA- no complaints of pain noted.   She is very active, walks frequently Appetite is good when reminded and does protein shake.  Sleeping well at night.   Thyroid disease- not on any medication  Chronic UTI- no recent infection.   Osteoporosis- on vit d only, did not tolerate  fosamax.     Review of Systems:  Review of Systems  Constitutional: Negative for chills, fever and weight loss.  HENT: Negative for tinnitus.   Respiratory: Negative for cough, sputum production and shortness of breath.   Cardiovascular: Negative for chest pain, palpitations and leg swelling.  Gastrointestinal: Negative for abdominal pain, constipation, diarrhea and heartburn.  Genitourinary: Negative for dysuria, frequency and urgency.  Musculoskeletal: Negative for back pain, falls, joint pain and myalgias.  Skin: Positive for itching and rash.  Neurological: Negative for dizziness and headaches.  Psychiatric/Behavioral: Negative for depression and memory loss. The patient is nervous/anxious and has insomnia.     Past Medical History:  Diagnosis Date  . Abnormal thyroid blood test 11/04/2010   Dr.Berglind's lab results indicated TSH was low, Per records provided by patient at new patient appointment   . Alzheimer disease [facility])    Per [facility] New Patient packet  . Angle-closure glaucoma    Per records provided by patient at new patient appointment   . Arthritis    Per [facility] New Patient packet  . Breast cancer ([place])    Chemo 04/2001-07/2001, Per [facility] New Patient packet  . Chronic kidney disease    Per [facility] New Patient packet  . Chronic UTI    Per [facility] New Patient packet  . Colon polyps  Per [facility] New Patient packet  . Dementia [facility])    Per [facility] New Patient packet  . Eczema    Per [facility] New Patient packet  . Glaucoma    Per [facility] New Patient packet  . H/O angiography 02/2019   Per [place] Patient Packet  . H/O CT scan of head 01/2020   Per [place] Patient Packet  . H/O cystoscopy 03/12/2014   Dr.Kelly Venia Minks, Per [facility] New Patient packet  . H/O laser iridotomy 06/2015   Per [place] Patient Packet  . Hematuria    Per [facility] New Patient packet  . [facility])    Dr.Heher, Per [facility] New Patient packet  . Hives    Per records provided by patient at new patient  appointment   . Hypertension    Per [facility] New Patient packet  . Memory loss    Over the past 6 years (as of 2021), Per [facility] New Patient packet  . Osteoporosis    Per [facility] New Patient packet  . Retinal vein occlusion of left eye    Per [facility] New Patient packet  . Thyroid disease    Per [facility] New Patient packet  . Vitamin D deficiency    Per [facility] New Patient packet   Past Surgical History:  Procedure Laterality Date  . ABDOMINAL HYSTERECTOMY     Fibroid tumor. Patient with ovaries. Per [facility] New Patient packet  . BIOPSY THYROID     Dr.Rahmani, Per [facility] New Patient packet  . BREAST BIOPSY  02/1987   Per [facility] New Patient Packet  . BREAST BIOPSY  01/2001   Per [facility] New Patient packet  . COLONOSCOPY  05/17/2017   Per [place] Patient packet, performed by Dr. Steffanie Dunn at [facility]. Polyp was removed.  Marland Kitchen MASTECTOMY  02/2001   Per [place] New Patient packet  . MASTECTOMY MODIFIED RADICAL  03/1987   Per [facility] New Patient packet  . RENAL BIOPSY  2003   Done in [place]. Per records provided by patient at new patient appointment   . TONSILLECTOMY  1944   Per [facility] New Patient packet   Social History:   reports that she has never smoked. She has never used smokeless tobacco. She reports that she does not drink alcohol and does not use drugs.  Family History  Problem Relation Age of Onset  . Cancer Mother   . Heart disease Father   . Hypertension Son   . Hypertension Daughter     Medications: Patient's Medications  New Prescriptions   VITAMIN D, ERGOCALCIFEROL, (DRISDOL) 1.25 MG (50000 UNIT) CAPS CAPSULE    Take 1 capsule (50,000 Units total) by mouth every 7 (seven) days.  Previous Medications   B COMPLEX VITAMINS CAPSULE    Take 1 capsule by mouth daily.   CARBOXYMETH-GLYC-POLYSORB PF (REFRESH OPTIVE ADVANCED PF) 0.5-1-0.5 % SOLN    Apply 1 drop to eye in the morning and at bedtime.   DORZOLAMIDEL-TIMOLOL (COSOPT) 22.3-6.8 MG/ML SOLN OPHTHALMIC SOLUTION    Place 1 drop into  both eyes 2 (two) times daily.   LATANOPROST (XALATAN) 0.005 % OPHTHALMIC SOLUTION    Place 1 drop into both eyes at bedtime.   METOPROLOL SUCCINATE (TOPROL-XL) 100 MG 24 HR TABLET    Take 100 mg by mouth daily. Take with or immediately following a meal.   VITAMIN D, CHOLECALCIFEROL, PO    Take 2,000 Units by mouth daily. Liquid  Modified Medications   No medications on  file  Discontinued Medications   No medications on file    Physical Exam:  Vitals:   06/19/20 1341  BP: (!) 142/98  Pulse: 76  Temp: (!) 96.4 F (35.8 C)  TempSrc: Temporal  SpO2: 99%  Weight: 85 lb 12.8 oz (38.9 kg)  Height: _0  (1.626 m)   Body mass index is 14.73 kg/m. Wt Readings from Last 3 Encounters:  06/19/20 85 lb 12.8 oz (38.9 kg)    Physical Exam Constitutional:      General: She is not in acute distress.    Appearance: She is well-developed. She is not diaphoretic.  HENT:     Head: Normocephalic and atraumatic.     Mouth/Throat:     Pharynx: No oropharyngeal exudate.  Eyes:     Conjunctiva/sclera: Conjunctivae normal.     Pupils: Pupils are equal, round, and reactive to light.  Cardiovascular:     Rate and Rhythm: Normal rate and regular rhythm.     Heart sounds: Normal heart sounds.  Pulmonary:     Effort: Pulmonary effort is normal.     Breath sounds: Normal breath sounds.  Abdominal:     General: Bowel sounds are normal.     Palpations: Abdomen is soft.  Musculoskeletal:        General: No tenderness.     Cervical back: Normal range of motion and neck supple.     Right lower leg: No edema.     Left lower leg: No edema.  Skin:    General: Skin is warm and dry.     Findings: Rash present.     Comments: Macular erythematous rash around neckline of chest and sides of neck  Neurological:     Mental Status: She is alert. She is disoriented.     Motor: No weakness.     Gait: Gait normal.  Psychiatric:        Cognition and Memory: Cognition is impaired. Memory is impaired. She  exhibits impaired recent memory and impaired remote memory.     Labs reviewed: Basic Metabolic Panel: Recent Labs    05/20/20 0000  NA 144  K 4.0  CL 107  CO2 27*  BUN 29*  CREATININE 1.0  CALCIUM 9.5   Liver Function Tests: Recent Labs    05/20/20 0000  AST 16  ALT 16  ALKPHOS 71  ALBUMIN 4.5   No results for input(s): LIPASE, AMYLASE in the last 8760 hours. No results for input(s): AMMONIA in the last 8760 hours. CBC: Recent Labs    05/20/20 0000  WBC 7.9  HGB 14.7  HCT 45  PLT 231   Lipid Panel: No results for input(s): CHOL, HDL, LDLCALC, TRIG, CHOLHDL, LDLDIRECT in the last 8760 hours. TSH: No results for input(s): TSH in the last 8760 hours. A1C: No results found for: HGBA1C   Assessment/Plan 1. Vitamin D deficiency - Vitamin D, Ergocalciferol, (DRISDOL) 1.25 MG (50000 UNIT) CAPS capsule; Take 1 capsule (50,000 Units total) by mouth every 7 (seven) days.  Dispense: 12 capsule; Refill: 0 - Vitamin D, 25-hydroxy; Future  2. Need for influenza vaccination - Flu Vaccine QUAD High Dose(Fluad)  3. Primary osteoarthritis, unspecified site Without current pain or limitations.   4. Hx of breast cancer S/p mastectomy, radiation and chemo in 2002. No longer followed by oncology.  5. Stage 3a chronic kidney disease ([place]) Encourage proper hydration and to avoid NSAIDS (Aleve, Advil, Motrin, Ibuprofen), BUN and Cr stable on recent labs.  6. Hypertension, unspecified type  Elevated in office but daughter reports out of office readings at goal. <140/90. Continues on metoprolol.  - CBC with Differential/Platelet; Future - CMP with eGFR([facility]); Future  7. Osteoporosis, unspecified osteoporosis type, unspecified pathological fracture presence Will need to review records and prior dexa scan. Not on any current treatment at this time.   8. Low TSH level - TSH; Future, first noted low TSH in 2012, s/p biopsy which were negative.   9. Vitamin B12  deficiency Previously on B12 injection but now on oral supplement. - Vitamin B12; Future  10. Alzheimer's disease ([place]) Severe, She had neurology consult prior to moving where she was started on namenda but never increased dose from 5 mg daily, given instructions for titration. Her husband helps her with her ADL and medication management. Daughter in with her for appt today who provided most of the information.   Medical records requested.  Next appt: 3 months, labs prior  Braxton Weisbecker K. Crom[place], [facility] [phone]

## 2020-06-19 NOTE — Patient Instructions (Signed)
To use hydrocortisone cream to rash on neck Avoid irritant  To start Vit d 50,000 units weekly by mouth for 12 weeks then restart Vit d 2000 units daily  To increase namenda to 5 mg by mouth twice daily for 1 week then increase to 10 mg in the am and 5 mg in the pm for 1 week then increase to 10 mg by mouth twice daily. Goal is Namenda 10 mg by mouth twice daily to preserve memor    Contact Dermatitis Dermatitis is redness, soreness, and swelling (inflammation) of the skin. Contact dermatitis is a reaction to something that touches the skin. There are two types of contact dermatitis:  Irritant contact dermatitis. This happens when something bothers (irritates) your skin, like soap.  Allergic contact dermatitis. This is caused when you are exposed to something that you are allergic to, such as poison ivy. What are the causes?  Common causes of irritant contact dermatitis include: ? Makeup. ? Soaps. ? Detergents. ? Bleaches. ? Acids. ? Metals, such as nickel.  Common causes of allergic contact dermatitis include: ? Plants. ? Chemicals. ? Jewelry. ? Latex. ? Medicines. ? Preservatives in products, such as clothing. What increases the risk?  Having a job that exposes you to things that bother your skin.  Having asthma or eczema. What are the signs or symptoms? Symptoms may happen anywhere the irritant has touched your skin. Symptoms include:  Dry or flaky skin.  Redness.  Cracks.  Itching.  Pain or a burning feeling.  Blisters.  Blood or clear fluid draining from skin cracks. With allergic contact dermatitis, swelling may occur. This may happen in places such as the eyelids, mouth, or genitals. How is this treated?  This condition is treated by checking for the cause of the reaction and protecting your skin. Treatment may also include: ? Steroid creams, ointments, or medicines. ? Antibiotic medicines or other ointments, if you have a skin infection. ? Lotion or  medicines to help with itching. ? A bandage (dressing). Follow these instructions at home: Skin care  Moisturize your skin as needed.  Put cool cloths on your skin.  Put a baking soda paste on your skin. Stir water into baking soda until it looks like a paste.  Do not scratch your skin.  Avoid having things rub up against your skin.  Avoid the use of soaps, perfumes, and dyes. Medicines  Take or apply over-the-counter and prescription medicines only as told by your doctor.  If you were prescribed an antibiotic medicine, take or apply it as told by your doctor. Do not stop using it even if your condition starts to get better. Bathing  Take a bath with: ? Epsom salts. ? Baking soda. ? Colloidal oatmeal.  Bathe less often.  Bathe in warm water. Avoid using hot water. Bandage care  If you were given a bandage, change it as told by your health care provider.  Wash your hands with soap and water before and after you change your bandage. If soap and water are not available, use hand sanitizer. General instructions  Avoid the things that caused your reaction. If you do not know what caused it, keep a journal. Write down: ? What you eat. ? What skin products you use. ? What you drink. ? What you wear in the area that has symptoms. This includes jewelry.  Check the affected areas every day for signs of infection. Check for: ? More redness, swelling, or pain. ? More fluid or blood. ?  Warmth. ? Pus or a bad smell.  Keep all follow-up visits as told by your doctor. This is important. Contact a doctor if:  You do not get better with treatment.  Your condition gets worse.  You have signs of infection, such as: ? More swelling. ? Tenderness. ? More redness. ? Soreness. ? Warmth.  You have a fever.  You have new symptoms. Get help right away if:  You have a very bad headache.  You have neck pain.  Your neck is stiff.  You throw up (vomit).  You feel very  sleepy.  You see red streaks coming from the area.  Your bone or joint near the area hurts after the skin has healed.  The area turns darker.  You have trouble breathing. Summary  Dermatitis is redness, soreness, and swelling of the skin.  Symptoms may occur where the irritant has touched you.  Treatment may include medicines and skin care.  If you do not know what caused your reaction, keep a journal.  Contact a doctor if your condition gets worse or you have signs of infection. This information is not intended to replace advice given to you by your health care provider. Make sure you discuss any questions you have with your health care provider. Document Revised: 12/12/2018 Document Reviewed: 03/07/2018 Elsevier Patient Education  [place].

## 2020-06-22 ENCOUNTER — Other Ambulatory Visit: Payer: Self-pay | Admitting: *Deleted

## 2020-06-22 DIAGNOSIS — E559 Vitamin D deficiency, unspecified: Secondary | ICD-10-CM

## 2020-06-22 MED ORDER — VITAMIN D (ERGOCALCIFEROL) 1.25 MG (50000 UNIT) PO CAPS: 50000.0000 [IU] | ORAL_CAPSULE | ORAL | 0 refills | Status: DC

## 2020-06-22 NOTE — Telephone Encounter (Signed)
Emily Dawson, daughter called and stated that patient was seen Friday and Vitamin D was to be called in but pharmacy never received.  I Apologized and Explained to her that [facility] was down and I would resend.

## 2020-08-03 ENCOUNTER — Encounter: Payer: Self-pay | Admitting: Nurse Practitioner

## 2020-08-03 DIAGNOSIS — G309 Alzheimer's disease, unspecified: Secondary | ICD-10-CM

## 2020-08-03 MED ORDER — MEMANTINE HCL 10 MG PO TABS: 10.0000 mg | ORAL_TABLET | Freq: Two times a day (BID) | ORAL | 1 refills | Status: DC

## 2020-08-03 MED ORDER — METOPROLOL SUCCINATE ER 100 MG PO TB24: 100.0000 mg | ORAL_TABLET | Freq: Every day | ORAL | 1 refills | Status: DC

## 2020-09-09 ENCOUNTER — Other Ambulatory Visit: Payer: Self-pay | Admitting: Nurse Practitioner

## 2020-09-09 DIAGNOSIS — E559 Vitamin D deficiency, unspecified: Secondary | ICD-10-CM

## 2020-09-17 ENCOUNTER — Other Ambulatory Visit: Payer: Medicare Other

## 2020-09-17 ENCOUNTER — Other Ambulatory Visit: Payer: Self-pay

## 2020-09-17 DIAGNOSIS — R7989 Other specified abnormal findings of blood chemistry: Secondary | ICD-10-CM

## 2020-09-17 DIAGNOSIS — E538 Deficiency of other specified B group vitamins: Secondary | ICD-10-CM

## 2020-09-17 DIAGNOSIS — I10 Essential (primary) hypertension: Secondary | ICD-10-CM

## 2020-09-17 DIAGNOSIS — E559 Vitamin D deficiency, unspecified: Secondary | ICD-10-CM

## 2020-09-18 LAB — COMPLETE METABOLIC PANEL WITH GFR
AG Ratio: 1.5 (calc) (ref 1.0–2.5)
ALT: 24 U/L (ref 6–29)
AST: 25 U/L (ref 10–35)
Albumin: 4.4 g/dL (ref 3.6–5.1)
Alkaline phosphatase (APISO): 69 U/L (ref 37–153)
BUN/Creatinine Ratio: 40 (calc) — ABNORMAL HIGH (ref 6–22)
BUN: 42 mg/dL — ABNORMAL HIGH (ref 7–25)
CO2: 28 mmol/L (ref 20–32)
Calcium: 10 mg/dL (ref 8.6–10.4)
Chloride: 108 mmol/L (ref 98–110)
Creat: 1.06 mg/dL — ABNORMAL HIGH (ref 0.60–0.88)
GFR, Est African American: 56 mL/min/{1.73_m2} — ABNORMAL LOW (ref >=60)
GFR, Est Non African American: 48 mL/min/{1.73_m2} — ABNORMAL LOW (ref >=60)
Globulin: 3 g/dL (calc) (ref 1.9–3.7)
Glucose, Bld: 87 mg/dL (ref 65–139)
Potassium: 4.1 mmol/L (ref 3.5–5.3)
Sodium: 144 mmol/L (ref 135–146)
Total Bilirubin: 0.6 mg/dL (ref 0.2–1.2)
Total Protein: 7.4 g/dL (ref 6.1–8.1)

## 2020-09-18 LAB — CBC WITH DIFFERENTIAL/PLATELET
Absolute Monocytes: 997 cells/uL — ABNORMAL HIGH (ref 200–950)
Basophils Absolute: 71 cells/uL (ref 0–200)
Basophils Relative: 0.8 %
Eosinophils Absolute: 116 cells/uL (ref 15–500)
Eosinophils Relative: 1.3 %
HCT: 47.3 % — ABNORMAL HIGH (ref 35.0–45.0)
Hemoglobin: 16.2 g/dL — ABNORMAL HIGH (ref 11.7–15.5)
Lymphs Abs: 1513 cells/uL (ref 850–3900)
MCH: 33 pg (ref 27.0–33.0)
MCHC: 34.2 g/dL (ref 32.0–36.0)
MCV: 96.3 fL (ref 80.0–100.0)
MPV: 12.9 fL — ABNORMAL HIGH (ref 7.5–12.5)
Monocytes Relative: 11.2 %
Neutro Abs: 6203 cells/uL (ref 1500–7800)
Neutrophils Relative %: 69.7 %
Platelets: 238 10*3/uL (ref 140–400)
RBC: 4.91 10*6/uL (ref 3.80–5.10)
RDW: 11.9 % (ref 11.0–15.0)
Total Lymphocyte: 17 %
WBC: 8.9 10*3/uL (ref 3.8–10.8)

## 2020-09-18 LAB — VITAMIN D 25 HYDROXY (VIT D DEFICIENCY, FRACTURES): Vit D, 25-Hydroxy: 94 ng/mL (ref 30–100)

## 2020-09-18 LAB — TSH: TSH: 1.71 mIU/L (ref 0.40–4.50)

## 2020-09-18 LAB — VITAMIN B12: Vitamin B-12: 587 pg/mL (ref 200–1100)

## 2020-09-23 ENCOUNTER — Other Ambulatory Visit: Payer: Self-pay

## 2020-09-23 ENCOUNTER — Encounter: Payer: Self-pay | Admitting: Nurse Practitioner

## 2020-09-23 ENCOUNTER — Ambulatory Visit (INDEPENDENT_AMBULATORY_CARE_PROVIDER_SITE_OTHER): Payer: Medicare Other | Admitting: Nurse Practitioner

## 2020-09-23 VITALS — BP 140/78 | HR 64 | Temp 96.4°F | Ht 64.0 in | Wt 92.0 lb

## 2020-09-23 DIAGNOSIS — E559 Vitamin D deficiency, unspecified: Secondary | ICD-10-CM

## 2020-09-23 DIAGNOSIS — R636 Underweight: Secondary | ICD-10-CM

## 2020-09-23 DIAGNOSIS — F0280 Dementia in other diseases classified elsewhere without behavioral disturbance: Secondary | ICD-10-CM

## 2020-09-23 DIAGNOSIS — G309 Alzheimer's disease, unspecified: Secondary | ICD-10-CM

## 2020-09-23 DIAGNOSIS — E538 Deficiency of other specified B group vitamins: Secondary | ICD-10-CM

## 2020-09-23 DIAGNOSIS — N1831 Chronic kidney disease, stage 3a: Secondary | ICD-10-CM

## 2020-09-23 DIAGNOSIS — R7989 Other specified abnormal findings of blood chemistry: Secondary | ICD-10-CM

## 2020-09-23 DIAGNOSIS — M810 Age-related osteoporosis without current pathological fracture: Secondary | ICD-10-CM

## 2020-09-23 DIAGNOSIS — I10 Essential (primary) hypertension: Secondary | ICD-10-CM

## 2020-09-23 MED ORDER — METOPROLOL SUCCINATE ER 100 MG PO TB24: 100.0000 mg | ORAL_TABLET | Freq: Every day | ORAL | 1 refills | Status: DC

## 2020-09-23 NOTE — Progress Notes (Signed)
Careteam: Patient Care Team: Lauree Chandler, NP as PCP - General (Geriatric Medicine)  PLACE OF SERVICE:  [place] Directive information Does Patient Have a Medical Advance Directive?: Yes, Type of Advance Directive: [place], Does patient want to make changes to medical advance directive?: No - Patient declined  Allergies  Allergen Reactions  . Ampicillin   . Arimidex [Anastrozole] Other (See Comments)    Kidney problems   . Azithromycin   . Celecoxib   . Fosamax [Alendronate] Other (See Comments)    Kidney problem   . Ibuprofen   . Naproxen   . Penicillins   . Tetracyclines & Related   . Losartan Rash    Chief Complaint  Patient presents with  . Medical Management of Chronic Issues    3 month follow-up. Discuss need for TD, DEXA, PNA. Here with daughter [name].      HPI: Patient is a 87 y.o. female for routine follow up Did not get records from prior PCP -requested in Oct  CKD- trying to keep track of fluids. Daughter wonders if she is not drinking enough fluids.   Reports dry skin that comes and goes  Vit D- level has improved on weekly supplement.   Ophthalmologist following due to glaucoma and cataracts   Dementia- tolerating namanda, up to 10 mg by mouth twice daily. No worsening of cognitive or physical function.   Going to exercise a few times a week. Going to get meals.   Osteoporosis- on vit d only, have not gotten records from prior bone density.   Weight gain noted.   Previously lived in [place]  Review of Systems:  Review of Systems  Unable to perform ROS: Dementia    Past Medical History:  Diagnosis Date  . Abnormal thyroid blood test 11/04/2010   Dr.Berglind's lab results indicated TSH was low, Per records provided by patient at new patient appointment   . Alzheimer disease [facility])    Per [facility] New Patient packet  . Angle-closure glaucoma    Per records provided by patient at new patient  appointment   . Arthritis    Per [facility] New Patient packet  . Breast cancer ([place])    Chemo 04/2001-07/2001, Per [facility] New Patient packet  . Chronic kidney disease    Per [facility] New Patient packet  . Chronic UTI    Per [facility] New Patient packet  . Colon polyps    Per [facility] New Patient packet  . Dementia [facility])    Per [facility] New Patient packet  . Eczema    Per [facility] New Patient packet  . Glaucoma    Per [facility] New Patient packet  . H/O angiography 02/2019   Per [place] Patient Packet  . H/O CT scan of head 01/2020   Per [place] Patient Packet  . H/O cystoscopy 03/12/2014   Dr.Kelly Venia Minks, Per [facility] New Patient packet  . H/O laser iridotomy 06/2015   Per [place] Patient Packet  . Hematuria    Per [facility] New Patient packet  . Henoch-Schonlein purpura [facility])    Dr.Heher, Per [facility] New Patient packet  . Hives    Per records provided by patient at new patient appointment   . Hypertension    Per [facility] New Patient packet  . Memory loss    Over the past 6 years (as of 2021), Per [facility] New Patient packet  . Osteoporosis    Per [facility] New Patient packet  .  Retinal vein occlusion of left eye    Per [facility] New Patient packet  . Thyroid disease    Per [facility] New Patient packet  . Vitamin D deficiency    Per [facility] New Patient packet   Past Surgical History:  Procedure Laterality Date  . ABDOMINAL HYSTERECTOMY     Fibroid tumor. Patient with ovaries. Per [facility] New Patient packet  . BIOPSY THYROID     Dr.Rahmani, Per [facility] New Patient packet  . BREAST BIOPSY  02/1987   Per [facility] New Patient Packet  . BREAST BIOPSY  01/2001   Per [facility] New Patient packet  . COLONOSCOPY  05/17/2017   Per [place] Patient packet, performed by Dr. Steffanie Dunn at [facility]. Polyp was removed.  Marland Kitchen MASTECTOMY  02/2001   Per [place] New Patient packet  . MASTECTOMY MODIFIED RADICAL  03/1987   Per [facility] New Patient packet  . RENAL BIOPSY  2003   Done in [place]. Per records provided by patient at new patient appointment   .  TONSILLECTOMY  1944   Per [facility] New Patient packet   Social History:   reports that she has never smoked. She has never used smokeless tobacco. She reports that she does not drink alcohol and does not use drugs.  Family History  Problem Relation Age of Onset  . Cancer Mother   . Heart disease Father   . Hypertension Son   . Hypertension Daughter     Medications: Patient's Medications  New Prescriptions   No medications on file  Previous Medications   B COMPLEX VITAMINS CAPSULE    Take 1 capsule by mouth daily.   CARBOXYMETH-GLYC-POLYSORB PF (REFRESH OPTIVE ADVANCED PF) 0.5-1-0.5 % SOLN    Apply 1 drop to eye in the morning and at bedtime.   DORZOLAMIDEL-TIMOLOL (COSOPT) 22.3-6.8 MG/ML SOLN OPHTHALMIC SOLUTION    Place 1 drop into both eyes 2 (two) times daily.   LATANOPROST (XALATAN) 0.005 % OPHTHALMIC SOLUTION    Place 1 drop into both eyes at bedtime.   MEMANTINE (NAMENDA) 10 MG TABLET    Take 1 tablet (10 mg total) by mouth 2 (two) times daily.   METOPROLOL SUCCINATE (TOPROL-XL) 100 MG 24 HR TABLET    Take 1 tablet (100 mg total) by mouth daily. Take with or immediately following a meal.   VITAMIN D, CHOLECALCIFEROL, PO    Take 2,000 Units by mouth daily. Liquid  Modified Medications   No medications on file  Discontinued Medications   VITAMIN D, ERGOCALCIFEROL, (DRISDOL) 1.25 MG (50000 UNIT) CAPS CAPSULE    TAKE 1 CAPSULE (50,000 UNITS TOTAL) BY MOUTH EVERY 7 (SEVEN) DAYS    Physical Exam:  Vitals:   09/23/20 1107  BP: 140/78  Pulse: 64  Temp: (!) 96.4 F (35.8 C)  TempSrc: Temporal  SpO2: 99%  Weight: 92 lb (41.7 kg)  Height: 5\' 4"  (1.626 m)   Body mass index is 15.79 kg/m. Wt Readings from Last 3 Encounters:  09/23/20 92 lb (41.7 kg)  06/19/20 85 lb 12.8 oz (38.9 kg)    Physical Exam Constitutional:      General: She is not in acute distress.    Appearance: She is well-developed, underweight and well-nourished. She is not diaphoretic.  HENT:     Head:  Normocephalic and atraumatic.     Mouth/Throat:     Mouth: Oropharynx is clear and moist.     Pharynx: No oropharyngeal exudate.  Eyes:     Conjunctiva/sclera: Conjunctivae  normal.     Pupils: Pupils are equal, round, and reactive to light.  Cardiovascular:     Rate and Rhythm: Normal rate and regular rhythm.     Heart sounds: Normal heart sounds.  Pulmonary:     Effort: Pulmonary effort is normal.     Breath sounds: Normal breath sounds.  Abdominal:     General: Bowel sounds are normal.     Palpations: Abdomen is soft.  Musculoskeletal:        General: No tenderness or edema.     Cervical back: Normal range of motion and neck supple.  Skin:    General: Skin is warm and dry.  Neurological:     Mental Status: She is alert. Mental status is at baseline.  Psychiatric:        Mood and Affect: Mood and affect and mood normal.        Behavior: Behavior normal.        Cognition and Memory: Cognition is impaired. Memory is impaired.     Labs reviewed: Basic Metabolic Panel: Recent Labs    05/20/20 0000 09/17/20 1034  NA 144 144  K 4.0 4.1  CL 107 108  CO2 27* 28  GLUCOSE  --  87  BUN 29* 42*  CREATININE 1.0 1.06*  CALCIUM 9.5 10.0  TSH  --  1.71   Liver Function Tests: Recent Labs    05/20/20 0000 09/17/20 1034  AST 16 25  ALT 16 24  ALKPHOS 71  --   BILITOT  --  0.6  PROT  --  7.4  ALBUMIN 4.5  --    No results for input(s): LIPASE, AMYLASE in the last 8760 hours. No results for input(s): AMMONIA in the last 8760 hours. CBC: Recent Labs    05/20/20 0000 09/17/20 1034  WBC 7.9 8.9  NEUTROABS  --  6,203  HGB 14.7 16.2*  HCT 45 47.3*  MCV  --  96.3  PLT 231 238   Lipid Panel: No results for input(s): CHOL, HDL, LDLCALC, TRIG, CHOLHDL, LDLDIRECT in the last 8760 hours. TSH: Recent Labs    09/17/20 1034  TSH 1.71   A1C: No results found for: HGBA1C   Assessment/Plan 1. Hypertension, unspecified type -stable, will continue current regimen. -  metoprolol succinate (TOPROL-XL) 100 MG 24 hr tablet; Take 1 tablet (100 mg total) by mouth daily. Take with or immediately following a meal.  Dispense: 90 tablet; Refill: 1  2. Underweight Has gained weight at this time. Continues with ensure and proper nutrition.   3. Vitamin D deficiency Improved. Has stopped weekly vit d and now taking vit d 2000 units daily  4. Alzheimer's disease ([place]) -advanced dementia. Overall stable at this time. Continues on namenda BID.  5. Stage 3a chronic kidney disease (HCC) -stable, encourage proper hydration and to avoid NSAIDS (Aleve, Advil, Motrin, Ibuprofen)   6. Osteoporosis, unspecified osteoporosis type, unspecified pathological fracture presence -have not received records yet from prior PCP, will request again, continues on vit D  7. Vitamin B12 deficiency -stable on vit b12 supplement.  8. Low TSH level -TSH at goal at this time.  Next appt: 4 months.  Carlos American. [place], [facility] [phone]

## 2020-09-30 NOTE — Telephone Encounter (Signed)
Request for records from [facility] in [place], [place] faxed to [employer]. -- Renette Butters

## 2020-10-26 ENCOUNTER — Encounter: Payer: MEDICARE | Attending: Neurology | Primary: Family Medicine

## 2020-11-24 ENCOUNTER — Telehealth: Payer: Self-pay

## 2020-11-24 NOTE — Telephone Encounter (Signed)
She will see Dinah tomorrow.

## 2020-11-24 NOTE — Telephone Encounter (Signed)
[name] the daughter, swelling in right ankle and redness for the last couple of days. She is still able to walk on it. Upcoming visit on May 23rd. Wants to rule of infection. [name] can be reached at [phone]. To Joelene Millin to advise.

## 2020-11-24 NOTE — Telephone Encounter (Signed)
Lets get her in the office to evaluate

## 2020-11-25 ENCOUNTER — Ambulatory Visit (INDEPENDENT_AMBULATORY_CARE_PROVIDER_SITE_OTHER): Payer: Medicare Other | Admitting: Family

## 2020-11-25 ENCOUNTER — Encounter: Payer: Self-pay | Admitting: Family

## 2020-11-25 ENCOUNTER — Other Ambulatory Visit: Payer: Self-pay

## 2020-11-25 VITALS — BP 120/80 | HR 82 | Temp 97.3°F | Resp 16 | Ht 64.0 in | Wt 98.2 lb

## 2020-11-25 DIAGNOSIS — L853 Xerosis cutis: Secondary | ICD-10-CM | POA: Diagnosis not present

## 2020-11-25 DIAGNOSIS — L03115 Cellulitis of right lower limb: Secondary | ICD-10-CM

## 2020-11-25 MED ORDER — CIPROFLOXACIN HCL 500 MG PO TABS: 500.0000 mg | ORAL_TABLET | Freq: Two times a day (BID) | ORAL | 0 refills | Status: AC

## 2020-11-25 MED ORDER — SACCHAROMYCES BOULARDII 250 MG PO CAPS: 250.0000 mg | ORAL_CAPSULE | Freq: Two times a day (BID) | ORAL | 0 refills | Status: DC

## 2020-11-25 NOTE — Patient Instructions (Signed)
-   cleanse right leg with saline or wash with warm water and dial soap,pat dry,Apply A& D to dry skin or Aquaphor,wrap leg with Kerlix and ACE wrap change dressing every other day until leg swelling resolve.  - Keep legs elevated when seated

## 2020-11-25 NOTE — Progress Notes (Signed)
Provider: Kristl Morioka FNP-C  Lauree Chandler, NP  Patient Care Team: Lauree Chandler, NP as PCP - General (Geriatric Medicine)  Extended Emergency Contact Information Primary Emergency Contact: [place], [place] Mobile Phone: [phone] Relation: Spouse Preferred language: [name] Secondary Emergency Contact: [name] Address: [place], [place] [address] Johnnette [place] of [place] Mobile Phone: [phone] Relation: Daughter Preferred language: English Interpreter needed? No  Code Status:  Full Code  Goals of care: Advanced Directive information Advanced Directives 11/25/2020  Does Patient Have a Medical Advance Directive? Yes  Type of Advance Directive [place]  Does patient want to make changes to medical advance directive? No - Patient declined  Copy of [place] in Chart? Yes - validated most recent copy scanned in chart (See row information)     Chief Complaint  Patient presents with  . Acute Visit    Complains of right ankle swelling and redness.    HPI:  Pt is a 87 y.o. female seen today for an acute visit for evaluation of right leg redness and swelling x 7 days.she is here with her daughter who providers additional HPI information.she resides in [place].does not know what caused swelling and redness of the leg.Has been scratching leg.she denies any pain,fever or chills.Stays active in the facility but no recall of going outside or insect bite.HPI limited due to cognitive impairment due to dementia.     Past Medical History:  Diagnosis Date  . Abnormal thyroid blood test 11/04/2010   Dr.Berglind's lab results indicated TSH was low, Per records provided by patient at new patient appointment   . Alzheimer disease [facility])    Per [facility] New Patient packet  . Angle-closure glaucoma    Per records provided by patient at new patient appointment   . Arthritis    Per [facility] New Patient packet  . Breast  cancer ([place])    Chemo 04/2001-07/2001, Per [facility] New Patient packet  . Chronic kidney disease    Per [facility] New Patient packet  . Chronic UTI    Per [facility] New Patient packet  . Colon polyps    Per [facility] New Patient packet  . Dementia [facility])    Per [facility] New Patient packet  . Eczema    Per [facility] New Patient packet  . Glaucoma    Per [facility] New Patient packet  . H/O angiography 02/2019   Per [place] Patient Packet  . H/O CT scan of head 01/2020   Per [place] Patient Packet  . H/O cystoscopy 03/12/2014   Dr.Kelly Venia Minks, Per [facility] New Patient packet  . H/O laser iridotomy 06/2015   Per [place] Patient Packet  . Hematuria    Per [facility] New Patient packet  . Henoch-Schonlein purpura [facility])    Dr.Heher, Per [facility] New Patient packet  . Hives    Per records provided by patient at new patient appointment   . Hypertension    Per [facility] New Patient packet  . Memory loss    Over the past 6 years (as of 2021), Per [facility] New Patient packet  . Osteoporosis    Per [facility] New Patient packet  . Retinal vein occlusion of left eye    Per [facility] New Patient packet  . Thyroid disease    Per [facility] New Patient packet  . Vitamin D deficiency    Per [facility] New Patient packet   Past Surgical History:  Procedure Laterality Date  . ABDOMINAL HYSTERECTOMY     Fibroid tumor. Patient with ovaries. Per [facility] New Patient packet  . BIOPSY THYROID     Dr.Rahmani, Per [facility] New Patient packet  . BREAST BIOPSY  02/1987   Per [facility] New Patient Packet  . BREAST BIOPSY  01/2001   Per [facility] New Patient packet  . COLONOSCOPY  05/17/2017   Per [place] Patient packet, [facility]. Polyp was removed.  Marland Kitchen MASTECTOMY  02/2001   Per [place] New Patient packet  . MASTECTOMY MODIFIED RADICAL  03/1987   Per [facility] New Patient packet  . RENAL BIOPSY  2003   Done in [place]. Per records provided by patient at new patient appointment   . TONSILLECTOMY  1944   Per [facility] New Patient packet    Allergies  Allergen  Reactions  . Nsaids   . Ampicillin   . Arimidex [Anastrozole] Other (See Comments)    Kidney problems   . Azithromycin   . Celecoxib   . Fosamax [Alendronate] Other (See Comments)    Kidney problem   . Ibuprofen   . Naproxen   . Penicillins   . Sulfa Antibiotics   . Tetracyclines & Related   . Losartan Rash    Outpatient Encounter Medications as of 11/25/2020  Medication Sig  . b complex vitamins capsule Take 1 capsule by mouth daily.  . Carboxymeth-Glyc-Polysorb PF (REFRESH OPTIVE ADVANCED PF) 0.5-1-0.5 % SOLN Apply 1 drop to eye in the morning and at bedtime.  . ciprofloxacin (CIPRO) 500 MG tablet Take 1 tablet (500 mg total) by mouth 2 (two) times daily for 7 days.  . dorzolamidel-timolol (COSOPT) 22.3-6.8 MG/ML SOLN ophthalmic solution Place 1 drop into both eyes 2 (two) times daily.  Marland Kitchen latanoprost (XALATAN) 0.005 % ophthalmic solution Place 1 drop into both eyes at bedtime.  . memantine (NAMENDA) 10 MG tablet Take 1 tablet (10 mg total) by mouth 2 (two) times daily.  . metoprolol succinate (TOPROL-XL) 100 MG 24 hr tablet Take 1 tablet (100 mg total) by mouth daily. Take with or immediately following a meal.  . PREDNISOLONE-MOXIFLOXACIN OP Apply to eye. 3 times a day for seven days  2 times a day for sever days 1 time daily for 14 days then stop.  Marland Kitchen saccharomyces boulardii (FLORASTOR) 250 MG capsule Take 1 capsule (250 mg total) by mouth 2 (two) times daily.  Marland Kitchen VITAMIN D, CHOLECALCIFEROL, PO Take 2,000 Units by mouth daily. Liquid   No facility-administered encounter medications on file as of 11/25/2020.    Review of Systems  Constitutional: Negative for appetite change, chills, fatigue and fever.  Cardiovascular: Negative for chest pain and palpitations.       Right leg   Gastrointestinal: Negative for abdominal distention, abdominal pain, constipation, diarrhea, nausea and vomiting.  Skin: Negative for pallor and rash.       Right leg swelling and redness   Neurological:  Negative for dizziness, light-headedness and headaches.    Immunization History  Administered Date(s) Administered  . Fluad Quad(high Dose 65+) 06/19/2020  . PFIZER(Purple Top)SARS-COV-2 Vaccination 11/05/2019, 11/28/2019, 07/15/2020   Pertinent  Health Maintenance Due  Topic Date Due  . DEXA SCAN  Never done  . PNA vac Low Risk Adult (1 of 2 - PCV13) Never done  . INFLUENZA VACCINE  Completed   Fall Risk  11/25/2020 09/23/2020 06/19/2020  Falls in the past year? 0 0 0  Number falls in  past yr: 0 0 0  Injury with Fall? 0 0 0   Functional Status Survey:    Vitals:   11/25/20 0927 11/25/20 1019  BP: (!) 160/80 120/80  Pulse: 82   Resp: 16   Temp: (!) 97.3 F (36.3 C)   SpO2: 94%   Weight: 98 lb 3.2 oz (44.5 kg)   Height: 5\' 4"  (1.626 m)    Body mass index is 16.86 kg/m. Physical Exam Vitals reviewed.  Constitutional:      General: She is not in acute distress.    Appearance: She is underweight. She is not ill-appearing.  HENT:     Head: Normocephalic.  Eyes:     General: No scleral icterus.       Right eye: No discharge.        Left eye: No discharge.     Conjunctiva/sclera: Conjunctivae normal.     Pupils: Pupils are equal, round, and reactive to light.  Cardiovascular:     Rate and Rhythm: Normal rate and regular rhythm.     Pulses: Normal pulses.     Heart sounds: Normal heart sounds. No murmur heard. No friction rub. No gallop.      Comments: No calf tenderness to palpation   Pulmonary:     Effort: Pulmonary effort is normal. No respiratory distress.     Breath sounds: Normal breath sounds. No wheezing, rhonchi or rales.  Chest:     Chest wall: No tenderness.  Musculoskeletal:        General: No swelling or tenderness. Normal range of motion.     Right lower leg: Edema present.     Left lower leg: No edema.  Skin:    General: Skin is warm and dry.     Coloration: Skin is not pale.     Findings: No erythema or rash.          Comments: Right leg  nonpitting edema with erythema noted and fingernail scratch marks along lateral leg.skin very dry. No calf tenderness to palpation  Skin cleanse with saline,pat dry,Triple antibiotic ointment applied on scratched areas then leg wrapped with Kerlix and ACE wrap from the bottom of toes to knee high.   Neurological:     Mental Status: She is alert. Mental status is at baseline.     Gait: Gait normal.     Labs reviewed: Recent Labs    01/15/20 0000 05/20/20 0000 09/17/20 1034  NA  --  144 144  K 3.4 4.0 4.1  CL 105 107 108  CO2 29* 27* 28  GLUCOSE  --   --  87  BUN 28* 29* 42*  CREATININE 0.9 1.0 1.06*  CALCIUM 10.0 9.5 10.0   Recent Labs    01/15/20 0000 05/20/20 0000 09/17/20 1034  AST 18 16 25   ALT 18 16 24   ALKPHOS 89 71  --   BILITOT  --   --  0.6  PROT  --   --  7.4  ALBUMIN 4.3 4.5  --    Recent Labs    01/15/20 0000 05/20/20 0000 09/17/20 1034  WBC 9.4 7.9 8.9  NEUTROABS 69.00  --  6,203  HGB 16.1* 14.7 16.2*  HCT 48* 45 47.3*  MCV  --   --  96.3  PLT 255 231 238   Lab Results  Component Value Date   TSH 1.71 09/17/2020   No results found for: HGBA1C Lab Results  Component Value Date   CHOL 202 (A) 05/02/2019  HDL 76 (A) 05/02/2019   LDLCALC 109 05/02/2019   TRIG 83 05/02/2019    Significant Diagnostic Results in last 30 days:  No results found.  Assessment/Plan  Cellulitis of right lower extremity Afebrile.Skin warm to touch,non-tender with Erythema and fingernail scratch marks noted.No rash or discharge noted. Skin cleanse with saline,pat dry,Triple antibiotic ointment applied on scratched areas then leg wrapped with Kerlix and ACE wrap from the bottom of toes to knee high. Daughter who is a Marine scientist at [employer] advised to change dressing every other day until swelling and scratched areas are healed then stop. - Advised to Apply A& D or Aquaphor to both legs at least once daily for skin dryness.   - Keep legs elevated when seated  - encouraged  to increase water intake to 6-8 glasses daily to hydrate skin.   - ciprofloxacin (CIPRO) 500 MG tablet; Take 1 tablet (500 mg total) by mouth 2 (two) times daily for 7 days.  Dispense: 14 tablet; Refill: 0 - saccharomyces boulardii (FLORASTOR) 250 MG capsule; Take 1 capsule (250 mg total) by mouth 2 (two) times daily.  Dispense: 20 capsule; Refill: 0  2. Dry skin - Advised to Apply A& D or Aquaphor to both legs at least once daily for skin dryness - Encouraged hydration as above.    Family/ staff Communication: Reviewed plan of care with patient and daughter.Patient kept asking same question over and over.Daughter verbalized understanding.   Labs/tests ordered: None   Next Appointment: As needed if symptoms worsen or fail to improve.   Sandrea Hughs, NP

## 2020-11-29 ENCOUNTER — Other Ambulatory Visit: Payer: Self-pay | Admitting: Nurse Practitioner

## 2020-11-29 DIAGNOSIS — E559 Vitamin D deficiency, unspecified: Secondary | ICD-10-CM

## 2020-12-03 ENCOUNTER — Encounter: Payer: Self-pay | Admitting: Nurse Practitioner

## 2020-12-03 DIAGNOSIS — F0390 Unspecified dementia without behavioral disturbance: Secondary | ICD-10-CM | POA: Insufficient documentation

## 2020-12-03 DIAGNOSIS — I10 Essential (primary) hypertension: Secondary | ICD-10-CM | POA: Insufficient documentation

## 2020-12-03 DIAGNOSIS — E559 Vitamin D deficiency, unspecified: Secondary | ICD-10-CM | POA: Insufficient documentation

## 2020-12-03 DIAGNOSIS — N183 Chronic kidney disease, stage 3 unspecified: Secondary | ICD-10-CM | POA: Insufficient documentation

## 2020-12-03 DIAGNOSIS — N189 Chronic kidney disease, unspecified: Secondary | ICD-10-CM | POA: Insufficient documentation

## 2020-12-03 DIAGNOSIS — R7989 Other specified abnormal findings of blood chemistry: Secondary | ICD-10-CM | POA: Insufficient documentation

## 2020-12-03 DIAGNOSIS — M810 Age-related osteoporosis without current pathological fracture: Secondary | ICD-10-CM | POA: Insufficient documentation

## 2020-12-04 ENCOUNTER — Other Ambulatory Visit (HOSPITAL_COMMUNITY): Payer: Self-pay | Admitting: Family

## 2020-12-04 ENCOUNTER — Other Ambulatory Visit: Payer: Self-pay

## 2020-12-04 ENCOUNTER — Ambulatory Visit (HOSPITAL_COMMUNITY)
Admission: RE | Admit: 2020-12-04 | Discharge: 2020-12-04 | Disposition: A | Discharge status: Home / Self Care | Payer: Medicare Other | Source: Ambulatory Visit | Attending: Internal Medicine | Admitting: Internal Medicine

## 2020-12-04 ENCOUNTER — Encounter: Payer: Self-pay | Admitting: Family

## 2020-12-04 ENCOUNTER — Ambulatory Visit (INDEPENDENT_AMBULATORY_CARE_PROVIDER_SITE_OTHER): Payer: Medicare Other | Admitting: Family

## 2020-12-04 VITALS — BP 140/80 | HR 73 | Temp 97.3°F | Resp 16 | Ht 64.0 in | Wt 100.4 lb

## 2020-12-04 DIAGNOSIS — R600 Localized edema: Secondary | ICD-10-CM | POA: Diagnosis not present

## 2020-12-04 DIAGNOSIS — R0989 Other specified symptoms and signs involving the circulatory and respiratory systems: Secondary | ICD-10-CM

## 2020-12-04 NOTE — Patient Instructions (Signed)
-   Keep leg elevated when seated  - Please get right leg ultrasound and ABI at vascular specialist then will call you with results   Edema  Edema is when you have too much fluid in your body or under your skin. Edema may make your legs, feet, and ankles swell up. Swelling is also common in looser tissues, like around your eyes. This is a common condition. It gets more common as you get older. There are many possible causes of edema. Eating too much salt (sodium) and being on your feet or sitting for a long time can cause edema in your legs, feet, and ankles. Hot weather may make edema worse. Edema is usually painless. Your skin may look swollen or shiny. Follow these instructions at home:  Keep the swollen body part raised (elevated) above the level of your heart when you are sitting or lying down.  Do not sit still or stand for a long time.  Do not wear tight clothes. Do not wear garters on your upper legs.  Exercise your legs. This can help the swelling go down.  Wear elastic bandages or support stockings as told by your doctor.  Eat a low-salt (low-sodium) diet to reduce fluid as told by your doctor.  Depending on the cause of your swelling, you may need to limit how much fluid you drink (fluid restriction).  Take over-the-counter and prescription medicines only as told by your doctor. Contact a doctor if:  Treatment is not working.  You have heart, liver, or kidney disease and have symptoms of edema.  You have sudden and unexplained weight gain. Get help right away if:  You have shortness of breath or chest pain.  You cannot breathe when you lie down.  You have pain, redness, or warmth in the swollen areas.  You have heart, liver, or kidney disease and get edema all of a sudden.  You have a fever and your symptoms get worse all of a sudden. Summary  Edema is when you have too much fluid in your body or under your skin.  Edema may make your legs, feet, and ankles swell  up. Swelling is also common in looser tissues, like around your eyes.  Raise (elevate) the swollen body part above the level of your heart when you are sitting or lying down.  Follow your doctor's instructions about diet and how much fluid you can drink (fluid restriction). This information is not intended to replace advice given to you by your health care provider. Make sure you discuss any questions you have with your health care provider. Document Revised: 06/17/2020 Document Reviewed: 06/17/2020 Elsevier Patient Education  2021 [employer].

## 2020-12-04 NOTE — Progress Notes (Signed)
Provider: Casen Pryor FNP-C  Lauree Chandler, NP  Patient Care Team: Lauree Chandler, NP as PCP - General (Geriatric Medicine)  Extended Emergency Contact Information Primary Emergency Contact: Kamyla, [name] Mobile Phone: [phone] Relation: Spouse Preferred language: [name] Secondary Emergency Contact: [name] Address: [place], [place] [address] Johnnette Litter of [place] Mobile Phone: [phone] Relation: Daughter Preferred language: English Interpreter needed? No  Code Status:  DNR Goals of care: Advanced Directive information Advanced Directives 12/04/2020  Does Patient Have a Medical Advance Directive? Yes  Type of Advance Directive [place]  Does patient want to make changes to medical advance directive? No - Patient declined  Copy of [place] in Chart? Yes - validated most recent copy scanned in chart (See row information)     Chief Complaint  Patient presents with  . Follow-up    Follow up on unresolved rash.    HPI:  Pt is a 87 y.o. female seen today for an acute visit for follow up right leg cellulitis.she is here with her daughter.she was here 11/25/2020 with right leg redness and swelling.skin was warm to touch.she was treated with a 7 days course of Cipro along with probiotics.Encouraged to wrap leg with kerlix and ACE to keep swelling down.Aquaphor for dry skin.Daughter states skin redness has improved with completion of antibiotic but not completely resolved.swelling on the right leg persist.Has been applying wrap but sometimes misses.she denies any calf tenderness,fever or chills.patient HPI limited due to dementia.    Past Medical History:  Diagnosis Date  . Abnormal thyroid blood test 11/04/2010   Dr.Berglind's lab results indicated TSH was low, Per records provided by patient at new patient appointment   . Alzheimer disease [facility])    Per [facility] New Patient packet  . Angle-closure  glaucoma    Per records provided by patient at new patient appointment   . Arthritis    Per [facility] New Patient packet  . Breast cancer ([place])    Chemo 04/2001-07/2001, Per [facility] New Patient packet  . Chronic kidney disease    Per [facility] New Patient packet  . Chronic UTI    Per [facility] New Patient packet  . Colon polyps    Per [facility] New Patient packet  . Dementia [facility])    Per [facility] New Patient packet  . Eczema    Per [facility] New Patient packet  . Glaucoma    Per [facility] New Patient packet  . H/O angiography 02/2019   Per [place] Patient Packet  . H/O CT scan of head 01/2020   Per [place] Patient Packet  . H/O cystoscopy 03/12/2014   Dr.Kelly Venia Minks, Per [facility] New Patient packet  . H/O laser iridotomy 06/2015   Per [place] Patient Packet  . Hematuria    Per [facility] New Patient packet  . Henoch-Schonlein purpura [facility])    Dr.Heher, Per [facility] New Patient packet  . Hives    Per records provided by patient at new patient appointment   . Hypertension    Per [facility] New Patient packet  . Low TSH level   . Memory loss    Over the past 6 years (as of 2021), Per [facility] New Patient packet  . Osteoporosis    Per [facility] New Patient packet  . Retinal vein occlusion of left eye    Per [facility] New Patient packet  . Thyroid disease    Per [place]  Patient packet  . Vitamin D deficiency    Per [facility] New Patient packet   Past Surgical History:  Procedure Laterality Date  . ABDOMINAL HYSTERECTOMY     Fibroid tumor. Patient with ovaries. Per [facility] New Patient packet  . BIOPSY THYROID     Dr.Rahmani, Per [facility] New Patient packet  . BREAST BIOPSY  02/1987   Per [facility] New Patient Packet  . BREAST BIOPSY  01/2001   Per [facility] New Patient packet  . COLONOSCOPY  05/17/2017   Per [place] Patient packet, performed by Dr. Steffanie Dunn at [facility]. Polyp was removed.  Marland Kitchen MASTECTOMY  02/2001   Per [place] New Patient packet  . MASTECTOMY MODIFIED RADICAL  03/1987   Per [facility] New Patient packet  . RENAL BIOPSY  2003   Done in  [place]. Per records provided by patient at new patient appointment   . TONSILLECTOMY  1944   Per [facility] New Patient packet    Allergies  Allergen Reactions  . Nsaids   . Ampicillin   . Arimidex [Anastrozole] Other (See Comments)    Kidney problems   . Azithromycin   . Celecoxib   . Fosamax [Alendronate] Other (See Comments)    Kidney problem   . Ibuprofen   . Naproxen   . Penicillins   . Sulfa Antibiotics   . Tetracyclines & Related   . Losartan Rash    Outpatient Encounter Medications as of 12/04/2020  Medication Sig  . b complex vitamins capsule Take 1 capsule by mouth daily.  . Carboxymeth-Glyc-Polysorb PF (REFRESH OPTIVE ADVANCED PF) 0.5-1-0.5 % SOLN Apply 1 drop to eye in the morning and at bedtime.  . dorzolamidel-timolol (COSOPT) 22.3-6.8 MG/ML SOLN ophthalmic solution Place 1 drop into both eyes 2 (two) times daily.  Marland Kitchen latanoprost (XALATAN) 0.005 % ophthalmic solution Place 1 drop into both eyes at bedtime.  . memantine (NAMENDA) 10 MG tablet Take 1 tablet (10 mg total) by mouth 2 (two) times daily.  . metoprolol succinate (TOPROL-XL) 100 MG 24 hr tablet Take 1 tablet (100 mg total) by mouth daily. Take with or immediately following a meal.  . PREDNISOLONE-MOXIFLOXACIN OP Apply to eye. 3 times a day for seven days  2 times a day for sever days 1 time daily for 14 days then stop.  Marland Kitchen saccharomyces boulardii (FLORASTOR) 250 MG capsule Take 1 capsule (250 mg total) by mouth 2 (two) times daily.  Marland Kitchen VITAMIN D, CHOLECALCIFEROL, PO Take 2,000 Units by mouth daily. Liquid   No facility-administered encounter medications on file as of 12/04/2020.    Review of Systems  Constitutional: Negative for appetite change, chills, fatigue and fever.  Respiratory: Negative for cough, chest tightness, shortness of breath and wheezing.   Cardiovascular: Positive for leg swelling. Negative for chest pain and palpitations.  Gastrointestinal: Negative for abdominal distention, abdominal pain,  constipation, diarrhea, nausea and vomiting.  Skin: Negative for color change, pallor and rash.       Right leg redness and swelling   Neurological: Negative for dizziness, speech difficulty, weakness, light-headedness and headaches.  Hematological: Does not bruise/bleed easily.    Immunization History  Administered Date(s) Administered  . Fluad Quad(high Dose 65+) 06/19/2020  . PFIZER(Purple Top)SARS-COV-2 Vaccination 11/05/2019, 11/28/2019, 07/15/2020  . Pneumococcal-Unspecified 02/15/2011   Pertinent  Health Maintenance Due  Topic Date Due  . DEXA SCAN  Never done  . PNA vac Low Risk Adult (2 of 2 - PCV13) 02/15/2012  . INFLUENZA VACCINE  04/05/2021  Fall Risk  12/04/2020 11/25/2020 09/23/2020 06/19/2020  Falls in the past year? 0 0 0 0  Number falls in past yr: 0 0 0 0  Injury with Fall? 0 0 0 0   Functional Status Survey:    Vitals:   12/04/20 1100  BP: 140/80  Pulse: 73  Resp: 16  Temp: (!) 97.3 F (36.3 C)  SpO2: 98%  Weight: 100 lb 6.4 oz (45.5 kg)  Height: 5\' 4"  (1.626 m)   Body mass index is 17.23 kg/m. Physical Exam Vitals reviewed.  Constitutional:      General: She is not in acute distress.    Appearance: She is underweight. She is not ill-appearing.  Cardiovascular:     Rate and Rhythm: Normal rate and regular rhythm.     Pulses: Normal pulses.     Heart sounds: Normal heart sounds. No murmur heard. No friction rub. No gallop.      Comments: No calf tenderness  Pulmonary:     Effort: Pulmonary effort is normal. No respiratory distress.     Breath sounds: Normal breath sounds. No wheezing, rhonchi or rales.  Chest:     Chest wall: No tenderness.  Abdominal:     General: Bowel sounds are normal. There is no distension.     Palpations: Abdomen is soft. There is no mass.     Tenderness: There is no abdominal tenderness. There is no guarding or rebound.  Musculoskeletal:        General: No tenderness. Normal range of motion.     Right lower leg:  Edema present.     Left lower leg: No edema.     Comments: Right leg > left leg   Skin:    General: Skin is warm and dry.     Coloration: Skin is not pale.     Findings: Erythema present. No rash.     Comments: Right leg skin erythema has improved but not resolved.   Neurological:     Mental Status: She is alert.     Labs reviewed: Recent Labs    01/15/20 0000 05/20/20 0000 09/17/20 1034  NA  --  144 144  K 3.4 4.0 4.1  CL 105 107 108  CO2 29* 27* 28  GLUCOSE  --   --  87  BUN 28* 29* 42*  CREATININE 0.9 1.0 1.06*  CALCIUM 10.0 9.5 10.0   Recent Labs    01/15/20 0000 05/20/20 0000 09/17/20 1034  AST 18 16 25   ALT 18 16 24   ALKPHOS 89 71  --   BILITOT  --   --  0.6  PROT  --   --  7.4  ALBUMIN 4.3 4.5  --    Recent Labs    01/15/20 0000 05/20/20 0000 09/17/20 1034  WBC 9.4 7.9 8.9  NEUTROABS 69.00  --  6,203  HGB 16.1* 14.7 16.2*  HCT 48* 45 47.3*  MCV  --   --  96.3  PLT 255 231 238   Lab Results  Component Value Date   TSH 1.71 09/17/2020   No results found for: HGBA1C Lab Results  Component Value Date   CHOL 202 (A) 05/02/2019   HDL 76 (A) 05/02/2019   LDLCALC 109 05/02/2019   TRIG 83 05/02/2019    Significant Diagnostic Results in last 30 days:  No results found.  Assessment/Plan  Edema of right lower leg Afebrile.  Right leg swelling with erythema form above ankle to below the knee.toes pale with  leg elevation. Will obtain ABI and U/S.no calf tenderness unlikely blood clot possible venous insufficiency.will refer to vascular specialist if indicated.   - VAS [place] ABI WITH/WO TBI; Future  Family/ staff Communication: Reviewed plan of care with patient and daughter   Labs/tests ordered: - VAS [place] ABI WITH/WO TBI; Future As needed if symptoms worsen or fail to improve Next Appointment:  Sandrea Hughs, NP

## 2020-12-11 ENCOUNTER — Other Ambulatory Visit: Payer: Self-pay

## 2020-12-11 ENCOUNTER — Ambulatory Visit (HOSPITAL_COMMUNITY)
Admission: RE | Admit: 2020-12-11 | Discharge: 2020-12-11 | Disposition: A | Discharge status: Home / Self Care | Payer: Medicare Other | Source: Ambulatory Visit | Attending: Cardiovascular Disease | Admitting: Cardiovascular Disease

## 2020-12-11 DIAGNOSIS — R0989 Other specified symptoms and signs involving the circulatory and respiratory systems: Secondary | ICD-10-CM | POA: Diagnosis present

## 2020-12-23 ENCOUNTER — Other Ambulatory Visit: Payer: Self-pay

## 2020-12-23 ENCOUNTER — Ambulatory Visit (INDEPENDENT_AMBULATORY_CARE_PROVIDER_SITE_OTHER): Payer: Medicare Other | Admitting: Nurse Practitioner

## 2020-12-23 ENCOUNTER — Encounter: Payer: Self-pay | Admitting: Nurse Practitioner

## 2020-12-23 VITALS — BP 140/82 | HR 60 | Temp 96.8°F | Ht 64.0 in | Wt 101.0 lb

## 2020-12-23 DIAGNOSIS — L03115 Cellulitis of right lower limb: Secondary | ICD-10-CM | POA: Diagnosis not present

## 2020-12-23 LAB — CBC WITH DIFFERENTIAL/PLATELET
Absolute Monocytes: 1107 cells/uL — ABNORMAL HIGH (ref 200–950)
Basophils Absolute: 72 cells/uL (ref 0–200)
Basophils Relative: 0.8 %
Eosinophils Absolute: 90 cells/uL (ref 15–500)
Eosinophils Relative: 1 %
HCT: 43.7 % (ref 35.0–45.0)
Hemoglobin: 14.4 g/dL (ref 11.7–15.5)
Lymphs Abs: 1827 cells/uL (ref 850–3900)
MCH: 31.6 pg (ref 27.0–33.0)
MCHC: 33 g/dL (ref 32.0–36.0)
MCV: 96 fL (ref 80.0–100.0)
MPV: 12.6 fL — ABNORMAL HIGH (ref 7.5–12.5)
Monocytes Relative: 12.3 %
Neutro Abs: 5904 cells/uL (ref 1500–7800)
Neutrophils Relative %: 65.6 %
Platelets: 225 10*3/uL (ref 140–400)
RBC: 4.55 10*6/uL (ref 3.80–5.10)
RDW: 11.8 % (ref 11.0–15.0)
Total Lymphocyte: 20.3 %
WBC: 9 10*3/uL (ref 3.8–10.8)

## 2020-12-23 MED ORDER — DOXYCYCLINE HYCLATE 100 MG PO TABS: 100.0000 mg | ORAL_TABLET | Freq: Two times a day (BID) | ORAL | 0 refills | Status: DC

## 2020-12-23 NOTE — Progress Notes (Signed)
Careteam: Patient Care Team: Lauree Chandler, NP as PCP - General (Geriatric Medicine)  PLACE OF SERVICE:  [place]  Advanced Directive information    Allergies  Allergen Reactions  . Nsaids   . Ampicillin   . Arimidex [Anastrozole] Other (See Comments)    Kidney problems   . Azithromycin   . Celecoxib   . Fosamax [Alendronate] Other (See Comments)    Kidney problem   . Ibuprofen   . Naproxen   . Penicillins   . Sulfa Antibiotics   . Tetracyclines & Related   . Losartan Rash    Chief Complaint  Patient presents with  . Acute Visit    Follow-up on right leg and swollen lymph node     HPI: Patient is a 87 y.o. female for follow up on cellulitis.   She was treated for cellulitis at the beginning of the month. On 4/8 the redness has almost completely resolved. Now right leg with increase redness and warm No fevers noted Having persistent swelling but not worse.   Review of Systems:  Review of Systems  Unable to perform ROS: Dementia   Past Medical History:  Diagnosis Date  . Abnormal thyroid blood test 11/04/2010   Dr.Berglind's lab results indicated TSH was low, Per records provided by patient at new patient appointment   . Alzheimer disease [facility])    Per [facility] New Patient packet  . Angle-closure glaucoma    Per records provided by patient at new patient appointment   . Arthritis    Per [facility] New Patient packet  . Breast cancer ([place])    Chemo 04/2001-07/2001, Per [facility] New Patient packet  . Chronic kidney disease    Per [facility] New Patient packet  . Chronic UTI    Per [facility] New Patient packet  . Colon polyps    Per [facility] New Patient packet  . Dementia [facility])    Per [facility] New Patient packet  . Eczema    Per [facility] New Patient packet  . Glaucoma    Per [facility] New Patient packet  . H/O angiography 02/2019   Per [place] Patient Packet  . H/O CT scan of head 01/2020   Per [place] Patient Packet  . H/O cystoscopy 03/12/2014   Dr.Kelly Venia Minks, Per [facility] New Patient  packet  . H/O laser iridotomy 06/2015   Per [place] Patient Packet  . Hematuria    Per [facility] New Patient packet  . Henoch-Schonlein purpura [facility])    Dr.Heher, Per [facility] New Patient packet  . Hives    Per records provided by patient at new patient appointment   . Hypertension    Per [facility] New Patient packet  . Low TSH level   . Memory loss    Over the past 6 years (as of 2021), Per [facility] New Patient packet  . Osteoporosis    Per [facility] New Patient packet  . Retinal vein occlusion of left eye    Per [facility] New Patient packet  . Thyroid disease    Per [facility] New Patient packet  . Vitamin D deficiency    Per [facility] New Patient packet   Past Surgical History:  Procedure Laterality Date  . ABDOMINAL HYSTERECTOMY     Fibroid tumor. Patient with ovaries. Per [facility] New Patient packet  . BIOPSY THYROID     Dr.Rahmani, Per [facility] New Patient packet  . BREAST BIOPSY  02/1987   Per [facility] New Patient Packet  . BREAST BIOPSY  01/2001   Per [place] New Patient packet  . COLONOSCOPY  05/17/2017   Per [place] Patient packet, performed by Dr. Steffanie Dunn at [facility]. Polyp was removed.  Marland Kitchen MASTECTOMY  02/2001   Per [place] New Patient packet  . MASTECTOMY MODIFIED RADICAL  03/1987   Per [facility] New Patient packet  . RENAL BIOPSY  2003   Done in [place]. Per records provided by patient at new patient appointment   . TONSILLECTOMY  1944   Per [facility] New Patient packet   Social History:   reports that she has never smoked. She has never used smokeless tobacco. She reports that she does not drink alcohol and does not use drugs.  Family History  Problem Relation Age of Onset  . Cancer Mother   . Heart disease Father   . Hypertension Son   . Hypertension Daughter     Medications: Patient's Medications  New Prescriptions   No medications on file  Previous Medications   B COMPLEX VITAMINS CAPSULE    Take 1 capsule by mouth daily.   CARBOXYMETH-GLYC-POLYSORB PF (REFRESH OPTIVE ADVANCED PF) 0.5-1-0.5 %  SOLN    Apply 1 drop to eye in the morning and at bedtime.   DORZOLAMIDEL-TIMOLOL (COSOPT) 22.3-6.8 MG/ML SOLN OPHTHALMIC SOLUTION    Place 1 drop into both eyes 2 (two) times daily.   LATANOPROST (XALATAN) 0.005 % OPHTHALMIC SOLUTION    Place 1 drop into both eyes at bedtime.   MEMANTINE (NAMENDA) 10 MG TABLET    Take 1 tablet (10 mg total) by mouth 2 (two) times daily.   METOPROLOL SUCCINATE (TOPROL-XL) 100 MG 24 HR TABLET    Take 1 tablet (100 mg total) by mouth daily. Take with or immediately following a meal.   VITAMIN D, CHOLECALCIFEROL, PO    Take 2,000 Units by mouth daily. Liquid  Modified Medications   No medications on file  Discontinued Medications   PREDNISOLONE-MOXIFLOXACIN OP    Apply to eye. 3 times a day for seven days  2 times a day for sever days 1 time daily for 14 days then stop.   SACCHAROMYCES BOULARDII (FLORASTOR) 250 MG CAPSULE    Take 1 capsule (250 mg total) by mouth 2 (two) times daily.    Physical Exam:  Vitals:   12/23/20 1137  BP: 140/82  Pulse: 60  Temp: (!) 96.8 F (36 C)  TempSrc: Temporal  SpO2: 99%  Weight: 101 lb (45.8 kg)  Height: 5\' 4"  (1.626 m)   Body mass index is 17.34 kg/m. Wt Readings from Last 3 Encounters:  12/23/20 101 lb (45.8 kg)  12/04/20 100 lb 6.4 oz (45.5 kg)  11/25/20 98 lb 3.2 oz (44.5 kg)    Physical Exam Constitutional:      General: She is not in acute distress.    Appearance: She is well-developed. She is not diaphoretic.  HENT:     Head: Normocephalic and atraumatic.     Mouth/Throat:     Pharynx: No oropharyngeal exudate.  Eyes:     Conjunctiva/sclera: Conjunctivae normal.     Pupils: Pupils are equal, round, and reactive to light.  Cardiovascular:     Rate and Rhythm: Normal rate and regular rhythm.     Heart sounds: Normal heart sounds.  Pulmonary:     Effort: Pulmonary effort is normal.     Breath sounds: Normal breath sounds.  Abdominal:     General: Bowel sounds are normal.     Palpations: Abdomen  is soft.  Musculoskeletal:        General: No tenderness.     Cervical back: Normal range of motion and neck supple.  Lymphadenopathy:     Lower Body: No right inguinal adenopathy. No left inguinal adenopathy.  Skin:    General: Skin is warm and dry.     Findings: Erythema present.     Comments: Redness, warmth and mild   Neurological:     Mental Status: She is alert and oriented to person, place, and time.    Labs reviewed: Basic Metabolic Panel: Recent Labs    01/15/20 0000 01/17/20 0000 05/20/20 0000 09/17/20 1034  NA  --   --  144 144  K 3.4  --  4.0 4.1  CL 105  --  107 108  CO2 29*  --  27* 28  GLUCOSE  --   --   --  87  BUN 28*  --  29* 42*  CREATININE 0.9  --  1.0 1.06*  CALCIUM 10.0  --  9.5 10.0  TSH  --  1.90  --  1.71   Liver Function Tests: Recent Labs    01/15/20 0000 05/20/20 0000 09/17/20 1034  AST 18 16 25   ALT 18 16 24   ALKPHOS 89 71  --   BILITOT  --   --  0.6  PROT  --   --  7.4  ALBUMIN 4.3 4.5  --    No results for input(s): LIPASE, AMYLASE in the last 8760 hours. No results for input(s): AMMONIA in the last 8760 hours. CBC: Recent Labs    01/15/20 0000 05/20/20 0000 09/17/20 1034  WBC 9.4 7.9 8.9  NEUTROABS 69.00  --  6,203  HGB 16.1* 14.7 16.2*  HCT 48* 45 47.3*  MCV  --   --  96.3  PLT 255 231 238   Lipid Panel: No results for input(s): CHOL, HDL, LDLCALC, TRIG, CHOLHDL, LDLDIRECT in the last 8760 hours. TSH: Recent Labs    01/17/20 0000 09/17/20 1034  TSH 1.90 1.71   A1C: No results found for: HGBA1C   Assessment/Plan 1. Cellulitis of right lower extremity -worsening redness to right lower leg.  -will restart doxycycline 100 mg BID for 2 weeks- to take with food and probiotic -to continue to elevate -to notify if redness worsens or fails to improve or worsens  - CBC with Differential/Platelet - doxycycline (VIBRA-TABS) 100 MG tablet; Take 1 tablet (100 mg total) by mouth 2 (two) times daily.  Dispense: 28  tablet; Refill: 0  Next appt: 01/25/2021 [facility]. [place], [facility] [phone]

## 2020-12-23 NOTE — Patient Instructions (Signed)
To start doxycyline 100 mg by mouth twice daily with food and take probiotic   Cellulitis, Adult  Cellulitis is a skin infection. The infected area is often warm, red, swollen, and sore. It occurs most often in the arms and lower legs. It is very important to get treated for this condition. What are the causes? This condition is caused by bacteria. The bacteria enter through a break in the skin, such as a cut, burn, insect bite, open sore, or crack. What increases the risk? This condition is more likely to occur in people who:  Have a weak body defense system (immune system).  Have open cuts, burns, bites, or scrapes on the skin.  Are older than 85 years of age.  Have a blood sugar problem (diabetes).  Have a long-lasting (chronic) liver disease (cirrhosis) or kidney disease.  Are very overweight (obese).  Have a skin problem, such as: ? Itchy rash (eczema). ? Slow movement of blood in the veins (venous stasis). ? Fluid buildup below the skin (edema).  Have been treated with high-energy rays (radiation).  Use IV drugs. What are the signs or symptoms? Symptoms of this condition include:  Skin that is: ? Red. ? Streaking. ? Spotting. ? Swollen. ? Sore or painful when you touch it. ? Warm.  A fever.  Chills.  Blisters. How is this diagnosed? This condition is diagnosed based on:  Medical history.  Physical exam.  Blood tests.  Imaging tests. How is this treated? Treatment for this condition may include:  Medicines to treat infections or allergies.  Home care, such as: ? Rest. ? Placing cold or warm cloths (compresses) on the skin.  Hospital care, if the condition is very bad. Follow these instructions at home: Medicines  Take over-the-counter and prescription medicines only as told by your doctor.  If you were prescribed an antibiotic medicine, take it as told by your doctor. Do not stop taking it even if you start to feel better. General  instructions  Drink enough fluid to keep your pee (urine) pale yellow.  Do not touch or rub the infected area.  Raise (elevate) the infected area above the level of your heart while you are sitting or lying down.  Place cold or warm cloths on the area as told by your doctor.  Keep all follow-up visits as told by your doctor. This is important.   Contact a doctor if:  You have a fever.  You do not start to get better after 1-2 days of treatment.  Your bone or joint under the infected area starts to hurt after the skin has healed.  Your infection comes back. This can happen in the same area or another area.  You have a swollen bump in the area.  You have new symptoms.  You feel ill and have muscle aches and pains. Get help right away if:  Your symptoms get worse.  You feel very sleepy.  You throw up (vomit) or have watery poop (diarrhea) for a long time.  You see red streaks coming from the area.  Your red area gets larger.  Your red area turns dark in color. These symptoms may represent a serious problem that is an emergency. Do not wait to see if the symptoms will go away. Get medical help right away. Call your local emergency services (911 in the [place] Do not drive yourself to the hospital. Summary  Cellulitis is a skin infection. The area is often warm, red, swollen, and sore.  This condition is treated with medicines, rest, and cold and warm cloths.  Take all medicines only as told by your doctor.  Tell your doctor if symptoms do not start to get better after 1-2 days of treatment. This information is not intended to replace advice given to you by your health care provider. Make sure you discuss any questions you have with your health care provider. Document Revised: 01/11/2018 Document Reviewed: 01/11/2018 Elsevier Patient Education  [place].

## 2021-01-06 ENCOUNTER — Encounter: Payer: Self-pay | Admitting: Nurse Practitioner

## 2021-01-06 ENCOUNTER — Ambulatory Visit (INDEPENDENT_AMBULATORY_CARE_PROVIDER_SITE_OTHER): Payer: Medicare Other | Admitting: Nurse Practitioner

## 2021-01-06 ENCOUNTER — Other Ambulatory Visit: Payer: Self-pay

## 2021-01-06 VITALS — BP 140/82 | HR 71 | Temp 96.8°F | Ht 64.0 in | Wt 99.6 lb

## 2021-01-06 DIAGNOSIS — L309 Dermatitis, unspecified: Secondary | ICD-10-CM

## 2021-01-06 DIAGNOSIS — L03115 Cellulitis of right lower limb: Secondary | ICD-10-CM | POA: Diagnosis not present

## 2021-01-06 MED ORDER — TRIAMCINOLONE ACETONIDE 0.1 % EX CREA: 1.0000 "application " | TOPICAL_CREAM | Freq: Two times a day (BID) | CUTANEOUS | 1 refills | Status: DC

## 2021-01-06 NOTE — Patient Instructions (Signed)
Use kenalog cream twice daily for redness

## 2021-01-06 NOTE — Progress Notes (Signed)
Careteam: Patient Care Team: Lauree Chandler, NP as PCP - General (Geriatric Medicine)  PLACE OF SERVICE:  [place] Directive information Does Patient Have a Medical Advance Directive?: Yes, Type of Advance Directive: [place], Does patient want to make changes to medical advance directive?: No - Patient declined  Allergies  Allergen Reactions  . Nsaids   . Ampicillin   . Arimidex [Anastrozole] Other (See Comments)    Kidney problems   . Azithromycin   . Celecoxib   . Fosamax [Alendronate] Other (See Comments)    Kidney problem   . Ibuprofen   . Naproxen   . Penicillins   . Sulfa Antibiotics   . Tetracyclines & Related   . Losartan Rash    Chief Complaint  Patient presents with  . Acute Visit    Leg swelling fluctuating between getting better and getting worse      HPI: Patient is a 87 y.o. female to reevaluate lower leg swelling and redness. She was treated with doxycycline for cellulitis with improvement then worsening of left LE redness once stopped. Doxycyline was extended due to redness coming back. Now left leg with redness and some excoriation noted.  She is not outside or in the sun.  Doing exercise inside.  No fevers.  Using lotion to lower legs due to dryness.   LE swelling mild-overall improved.  Review of Systems:  Review of Systems  Unable to perform ROS: Dementia    Past Medical History:  Diagnosis Date  . Abnormal thyroid blood test 11/04/2010   Dr.Berglind's lab results indicated TSH was low, Per records provided by patient at new patient appointment   . Alzheimer disease [facility])    Per [facility] New Patient packet  . Angle-closure glaucoma    Per records provided by patient at new patient appointment   . Arthritis    Per [facility] New Patient packet  . Breast cancer ([place])    Chemo 04/2001-07/2001, Per [facility] New Patient packet  . Chronic kidney disease    Per [facility] New Patient packet  . Chronic UTI    Per [facility] New  Patient packet  . Colon polyps    Per [facility] New Patient packet  . Dementia [facility])    Per [facility] New Patient packet  . Eczema    Per [facility] New Patient packet  . Glaucoma    Per [facility] New Patient packet  . H/O angiography 02/2019   Per [place] Patient Packet  . H/O CT scan of head 01/2020   Per [place] Patient Packet  . H/O cystoscopy 03/12/2014   Dr.Kelly Venia Minks, Per [facility] New Patient packet  . H/O laser iridotomy 06/2015   Per [place] Patient Packet  . Hematuria    Per [facility] New Patient packet  . Henoch-Schonlein purpura [facility])    Dr.Heher, Per [facility] New Patient packet  . Hives    Per records provided by patient at new patient appointment   . Hypertension    Per [facility] New Patient packet  . Low TSH level   . Memory loss    Over the past 6 years (as of 2021), Per [facility] New Patient packet  . Osteoporosis    Per [facility] New Patient packet  . Retinal vein occlusion of left eye    Per [facility] New Patient packet  . Thyroid disease    Per [facility] New Patient packet  . Vitamin D deficiency    Per [facility] New Patient  packet   Past Surgical History:  Procedure Laterality Date  . ABDOMINAL HYSTERECTOMY     Fibroid tumor. Patient with ovaries. Per [facility] New Patient packet  . BIOPSY THYROID     Dr.Rahmani, Per [facility] New Patient packet  . BREAST BIOPSY  02/1987   Per [facility] New Patient Packet  . BREAST BIOPSY  01/2001   Per [place] New Patient packet  . COLONOSCOPY  05/17/2017   Per [place] Patient packet, performed by Dr. [facility]ffanie Dunn at [facility]. Polyp was removed.  Marland Kitchen MASTECTOMY  02/2001   Per [place] New Patient packet  . MASTECTOMY MODIFIED RADICAL  03/1987   Per [facility] New Patient packet  . RENAL BIOPSY  2003   Done in [place]. Per records provided by patient at new patient appointment   . TONSILLECTOMY  1944   Per [facility] New Patient packet   Social History:   reports that she has never smoked. She has never used smokeless tobacco. She reports that she does not drink alcohol and does not use  drugs.  Family History  Problem Relation Age of Onset  . Cancer Mother   . Heart disease Father   . Hypertension Son   . Hypertension Daughter     Medications: Patient's Medications  New Prescriptions   No medications on file  Previous Medications   B COMPLEX VITAMINS CAPSULE    Take 1 capsule by mouth daily.   CARBOXYMETH-GLYC-POLYSORB PF (REFRESH OPTIVE ADVANCED PF) 0.5-1-0.5 % SOLN    Apply 1 drop to eye in the morning and at bedtime.   DORZOLAMIDEL-TIMOLOL (COSOPT) 22.3-6.8 MG/ML SOLN OPHTHALMIC SOLUTION    Place 1 drop into both eyes 2 (two) times daily.   DOXYCYCLINE (VIBRA-TABS) 100 MG TABLET    Take 1 tablet (100 mg total) by mouth 2 (two) times daily.   LATANOPROST (XALATAN) 0.005 % OPHTHALMIC SOLUTION    Place 1 drop into both eyes at bedtime.   MEMANTINE (NAMENDA) 10 MG TABLET    Take 1 tablet (10 mg total) by mouth 2 (two) times daily.   METOPROLOL SUCCINATE (TOPROL-XL) 100 MG 24 HR TABLET    Take 1 tablet (100 mg total) by mouth daily. Take with or immediately following a meal.   VITAMIN D, CHOLECALCIFEROL, PO    Take 2,000 Units by mouth daily. Liquid  Modified Medications   No medications on file  Discontinued Medications   No medications on file    Physical Exam:  Vitals:   01/06/21 1437  BP: 140/82  Pulse: 71  Temp: (!) 96.8 F (36 C)  TempSrc: Temporal  SpO2: 99%  Weight: 99 lb 9.6 oz (45.2 kg)  Height: 5\' 4"  (1.626 m)   Body mass index is 17.1 kg/m. Wt Readings from Last 3 Encounters:  01/06/21 99 lb 9.6 oz (45.2 kg)  12/23/20 101 lb (45.8 kg)  12/04/20 100 lb 6.4 oz (45.5 kg)    Physical Exam Constitutional:      General: She is not in acute distress.    Appearance: She is well-developed. She is not diaphoretic.  HENT:     Head: Normocephalic and atraumatic.     Mouth/Throat:     Pharynx: No oropharyngeal exudate.  Eyes:     Conjunctiva/sclera: Conjunctivae normal.     Pupils: Pupils are equal, round, and reactive to light.   Cardiovascular:     Rate and Rhythm: Normal rate and regular rhythm.     Heart sounds: Normal heart sounds.  Pulmonary:  Effort: Pulmonary effort is normal.     Breath sounds: Normal breath sounds.  Abdominal:     General: Bowel sounds are normal.     Palpations: Abdomen is soft.  Musculoskeletal:        General: No tenderness.     Cervical back: Normal range of motion and neck supple.  Skin:    General: Skin is warm and dry.     Findings: Erythema and rash (to left leg) present.  Neurological:     Mental Status: She is alert and oriented to person, place, and time.   *Labs reviewed: Basic Metabolic Panel: Recent Labs    01/15/20 0000 01/17/20 0000 05/20/20 0000 09/17/20 1034  NA  --   --  144 144  K 3.4  --  4.0 4.1  CL 105  --  107 108  CO2 29*  --  27* 28  GLUCOSE  --   --   --  87  BUN 28*  --  29* 42*  CREATININE 0.9  --  1.0 1.06*  CALCIUM 10.0  --  9.5 10.0  TSH  --  1.90  --  1.71   Liver Function Tests: Recent Labs    01/15/20 0000 05/20/20 0000 09/17/20 1034  AST 18 16 25   ALT 18 16 24   ALKPHOS 89 71  --   BILITOT  --   --  0.6  PROT  --   --  7.4  ALBUMIN 4.3 4.5  --    No results for input(s): LIPASE, AMYLASE in the last 8760 hours. No results for input(s): AMMONIA in the last 8760 hours. CBC: Recent Labs    01/15/20 0000 05/20/20 0000 09/17/20 1034 12/23/20 1158  WBC 9.4 7.9 8.9 9.0  NEUTROABS 69.00  --  6,203 5,904  HGB 16.1* 14.7 16.2* 14.4  HCT 48* 45 47.3* 43.7  MCV  --   --  96.3 96.0  PLT 255 231 238 225   Lipid Panel: No results for input(s): CHOL, HDL, LDLCALC, TRIG, CHOLHDL, LDLDIRECT in the last 8760 hours. TSH: Recent Labs    01/17/20 0000 09/17/20 1034  TSH 1.90 1.71   A1C: No results found for: HGBA1C   Assessment/Plan 1. Dermatitis noted to left lower leg -triamcinolone cream (KENALOG) 0.1 % -Apply 1 application topically 2 (two) times daily., Starting Wed 01/06/2021, Normal  2. Cellulitis of right lower  extremity -resolved, stop doxycycline at this time.   Next appt: 01/25/2021, sooner if needed  Carlos American. [place], [place] [facility] [phone]

## 2021-01-25 ENCOUNTER — Encounter: Payer: Self-pay | Admitting: Nurse Practitioner

## 2021-01-25 ENCOUNTER — Other Ambulatory Visit: Payer: Self-pay

## 2021-01-25 ENCOUNTER — Ambulatory Visit (INDEPENDENT_AMBULATORY_CARE_PROVIDER_SITE_OTHER): Payer: Medicare Other | Admitting: Nurse Practitioner

## 2021-01-25 VITALS — BP 140/90 | HR 81 | Temp 98.0°F | Resp 16 | Ht 64.0 in | Wt 97.2 lb

## 2021-01-25 DIAGNOSIS — I872 Venous insufficiency (chronic) (peripheral): Secondary | ICD-10-CM

## 2021-01-25 DIAGNOSIS — E2839 Other primary ovarian failure: Secondary | ICD-10-CM

## 2021-01-25 DIAGNOSIS — I10 Essential (primary) hypertension: Secondary | ICD-10-CM | POA: Diagnosis not present

## 2021-01-25 DIAGNOSIS — M810 Age-related osteoporosis without current pathological fracture: Secondary | ICD-10-CM

## 2021-01-25 DIAGNOSIS — G309 Alzheimer's disease, unspecified: Secondary | ICD-10-CM

## 2021-01-25 DIAGNOSIS — M545 Low back pain, unspecified: Secondary | ICD-10-CM

## 2021-01-25 DIAGNOSIS — R636 Underweight: Secondary | ICD-10-CM

## 2021-01-25 DIAGNOSIS — F0280 Dementia in other diseases classified elsewhere without behavioral disturbance: Secondary | ICD-10-CM

## 2021-01-25 NOTE — Progress Notes (Signed)
Careteam: Patient Care Team: Lauree Chandler, NP as PCP - General (Geriatric Medicine)  PLACE OF SERVICE:  [place] Directive information Does Patient Have a Medical Advance Directive?: Yes, Type of Advance Directive: [place], Does patient want to make changes to medical advance directive?: No - Patient declined  Allergies  Allergen Reactions  . Nsaids   . Ampicillin   . Arimidex [Anastrozole] Other (See Comments)    Kidney problems   . Azithromycin   . Celecoxib   . Fosamax [Alendronate] Other (See Comments)    Kidney problem   . Ibuprofen   . Naproxen   . Penicillins   . Sulfa Antibiotics   . Tetracyclines & Related   . Losartan Rash    Chief Complaint  Patient presents with  . Medical Management of Chronic Issues    4 month follow up.  Marland Kitchen Health Maintenance    Discuss the need for Dexa Scan.  . Immunizations    Discuss the need for Tetanus Vaccine, and PNA Vaccine.      HPI: Patient is a 87 y.o. female for routine follow up.   Reports hx of urinary and kidney issues. Now with low back pain and Daughter has concerns with this.  Used a new bike last week  No pain when she urinates.  Low back pain that started 4 days ago.  No injury or fall that they are aware of.  Have not tried anything to help with pain.  Daughter reports increase in agitation and confusion today.   Dementia-  Trying to simply due to the struggle to get her anywhere. increase in agitation. Daughter is trying to simplify regimen due to resistance.  Trying to get her back into routine.  Lives at [facility]- looking into more in home care.   Legs continue to be leg and swollen.   Review of Systems:  Review of Systems  Unable to perform ROS: Dementia    Past Medical History:  Diagnosis Date  . Abnormal thyroid blood test 11/04/2010   Dr.Berglind's lab results indicated TSH was low, Per records provided by patient at new patient appointment   .  Alzheimer disease [facility])    Per [facility] New Patient packet  . Angle-closure glaucoma    Per records provided by patient at new patient appointment   . Arthritis    Per [facility] New Patient packet  . Breast cancer ([place])    Chemo 04/2001-07/2001, Per [facility] New Patient packet  . Chronic kidney disease    Per [facility] New Patient packet  . Chronic UTI    Per [facility] New Patient packet  . Colon polyps    Per [facility] New Patient packet  . Dementia [facility])    Per [facility] New Patient packet  . Eczema    Per [facility] New Patient packet  . Glaucoma    Per [facility] New Patient packet  . H/O angiography 02/2019   Per [place] Patient Packet  . H/O CT scan of head 01/2020   Per [place] Patient Packet  . H/O cystoscopy 03/12/2014   Dr.Kelly Venia Minks, Per [facility] New Patient packet  . H/O laser iridotomy 06/2015   Per [place] Patient Packet  . Hematuria    Per [facility] New Patient packet  . Henoch-Schonlein purpura [facility])    Dr.Heher, Per [facility] New Patient packet  . Hives    Per records provided by patient at new patient appointment   . Hypertension  Per [facility] New Patient packet  . Low TSH level   . Memory loss    Over the past 6 years (as of 2021), Per [facility] New Patient packet  . Osteoporosis    Per [facility] New Patient packet  . Retinal vein occlusion of left eye    Per [facility] New Patient packet  . Thyroid disease    Per [facility] New Patient packet  . Vitamin D deficiency    Per [facility] New Patient packet   Past Surgical History:  Procedure Laterality Date  . ABDOMINAL HYSTERECTOMY     Fibroid tumor. Patient with ovaries. Per [facility] New Patient packet  . BIOPSY THYROID     Dr.Rahmani, Per [facility] New Patient packet  . BREAST BIOPSY  02/1987   Per [facility] New Patient Packet  . BREAST BIOPSY  01/2001   Per [facility] New Patient packet  . COLONOSCOPY  05/17/2017   Per [place] Patient packet, performed by Dr[facility] Steffanie [facility] at [facility]. Polyp was removed.  Marland Kitchen MASTECTOMY  02/2001   Per [place] New Patient packet  . MASTECTOMY MODIFIED RADICAL  03/1987    Per [facility] New Patient packet  . RENAL BIOPSY  2003   Done in [place]. Per records provided by patient at new patient appointment   . TONSILLECTOMY  1944   Per [facility] New Patient packet   Social History:   reports that she has never smoked. She has never used smokeless tobacco. She reports that she does not drink alcohol and does not use drugs.  Family History  Problem Relation Age of Onset  . Cancer Mother   . Heart disease Father   . Hypertension Son   . Hypertension Daughter     Medications: Patient's Medications  New Prescriptions   No medications on file  Previous Medications   B COMPLEX VITAMINS CAPSULE    Take 1 capsule by mouth daily.   CARBOXYMETH-GLYC-POLYSORB PF (REFRESH OPTIVE ADVANCED PF) 0.5-1-0.5 % SOLN    Apply 1 drop to eye in the morning and at bedtime.   DORZOLAMIDEL-TIMOLOL (COSOPT) 22.3-6.8 MG/ML SOLN OPHTHALMIC SOLUTION    Place 1 drop into both eyes 2 (two) times daily.   LATANOPROST (XALATAN) 0.005 % OPHTHALMIC SOLUTION    Place 1 drop into both eyes at bedtime.   MEMANTINE (NAMENDA) 10 MG TABLET    Take 1 tablet (10 mg total) by mouth 2 (two) times daily.   METOPROLOL SUCCINATE (TOPROL-XL) 100 MG 24 HR TABLET    Take 1 tablet (100 mg total) by mouth daily. Take with or immediately following a meal.   TRIAMCINOLONE CREAM (KENALOG) 0.1 %    Apply 1 application topically 2 (two) times daily.   VITAMIN D, CHOLECALCIFEROL, PO    Take 2,000 Units by mouth daily. Liquid  Modified Medications   No medications on file  Discontinued Medications   No medications on file    Physical Exam:  Vitals:   01/25/21 1113  BP: 140/90  Pulse: 81  Resp: 16  Temp: 98 F (36.7 C)  SpO2: 95%  Weight: 97 lb 3.2 oz (44.1 kg)  Height: $Remove'5\' 4"'lcgngwV$  (1.626 m)   Body mass index is 16.68 kg/m. Wt Readings from Last 3 Encounters:  01/25/21 97 lb 3.2 oz (44.1 kg)  01/06/21 99 lb 9.6 oz (45.2 kg)  12/23/20 101 lb (45.8 kg)    Physical Exam Constitutional:      General: She is  not in acute distress.    Appearance: She  is well-developed. She is not diaphoretic.  HENT:     Head: Normocephalic and atraumatic.     Mouth/Throat:     Pharynx: No oropharyngeal exudate.  Eyes:     Conjunctiva/sclera: Conjunctivae normal.     Pupils: Pupils are equal, round, and reactive to light.  Cardiovascular:     Rate and Rhythm: Normal rate and regular rhythm.     Heart sounds: Normal heart sounds.  Pulmonary:     Effort: Pulmonary effort is normal.     Breath sounds: Normal breath sounds.  Abdominal:     General: Bowel sounds are normal.     Palpations: Abdomen is soft.  Musculoskeletal:        General: Tenderness present.     Cervical back: Normal range of motion and neck supple.     Lumbar back: Tenderness present. Normal range of motion. Negative right straight leg raise test and negative left straight leg raise test.       Back:  Skin:    General: Skin is warm and dry.  Neurological:     Mental Status: She is alert and oriented to person, place, and time.     Labs reviewed: Basic Metabolic Panel: Recent Labs    05/20/20 0000 09/17/20 1034  NA 144 144  K 4.0 4.1  CL 107 108  CO2 27* 28  GLUCOSE  --  87  BUN 29* 42*  CREATININE 1.0 1.06*  CALCIUM 9.5 10.0  TSH  --  1.71   Liver Function Tests: Recent Labs    05/20/20 0000 09/17/20 1034  AST 16 25  ALT 16 24  ALKPHOS 71  --   BILITOT  --  0.6  PROT  --  7.4  ALBUMIN 4.5  --    No results for input(s): LIPASE, AMYLASE in the last 8760 hours. No results for input(s): AMMONIA in the last 8760 hours. CBC: Recent Labs    05/20/20 0000 09/17/20 1034 12/23/20 1158  WBC 7.9 8.9 9.0  NEUTROABS  --  6,203 5,904  HGB 14.7 16.2* 14.4  HCT 45 47.3* 43.7  MCV  --  96.3 96.0  PLT 231 238 225   Lipid Panel: No results for input(s): CHOL, HDL, LDLCALC, TRIG, CHOLHDL, LDLDIRECT in the last 8760 hours. TSH: Recent Labs    09/17/20 1034  TSH 1.71   A1C: No results found for:  HGBA1C   Assessment/Plan 1. Estrogen deficiency - DG Bone Density; Future  2. Acute right-sided low back pain, unspecified whether sciatica present -used a new stationary bike last week that could have contributed, she has not been back on.  -to use heating pad TID ~20 mins -muscle rub after heat -tylenol 500 mg 1-2 tablets every 8 hours as needed pain.   3. Hypertension, unspecified type Stable, will continue metoprolol  - CBC with Differential/Platelet - CMP with eGFR([facility])  4. Underweight -family continues to encourage 3 nutritious meals a day.   5. Chronic venous insufficiency of lower extremity -stable at this time. No increase in edema or redness today.  6. Osteoporosis, unspecified osteoporosis type, unspecified pathological fracture presence Due for bone density, unsure if she will take her to appt as appts cause increase in anxiety for her at this time.   7. Alzheimer's disease ([place]) Advanced dementia, has support of daughter and husband to help with care. Daughter trying to limit visit and medication. Next appt: 8 months. Carlos American. [place], [place] [phone]

## 2021-01-25 NOTE — Patient Instructions (Addendum)
Please clarify what pneumonia vaccines she has gotten.  TDAP- recommended to get at local vaccine  COVID booster - recommend to get at local pharmacy.

## 2021-01-26 ENCOUNTER — Telehealth: Payer: Self-pay | Admitting: *Deleted

## 2021-01-26 ENCOUNTER — Encounter: Payer: Self-pay | Admitting: Nurse Practitioner

## 2021-01-26 DIAGNOSIS — D729 Disorder of white blood cells, unspecified: Secondary | ICD-10-CM

## 2021-01-26 LAB — COMPLETE METABOLIC PANEL WITH GFR
AG Ratio: 1.4 (calc) (ref 1.0–2.5)
ALT: 12 U/L (ref 6–29)
AST: 14 U/L (ref 10–35)
Albumin: 4 g/dL (ref 3.6–5.1)
Alkaline phosphatase (APISO): 80 U/L (ref 37–153)
BUN/Creatinine Ratio: 27 (calc) — ABNORMAL HIGH (ref 6–22)
BUN: 24 mg/dL (ref 7–25)
CO2: 27 mmol/L (ref 20–32)
Calcium: 9.5 mg/dL (ref 8.6–10.4)
Chloride: 102 mmol/L (ref 98–110)
Creat: 0.9 mg/dL — ABNORMAL HIGH (ref 0.60–0.88)
GFR, Est African American: 68 mL/min/{1.73_m2} (ref >=60)
GFR, Est Non African American: 59 mL/min/{1.73_m2} — ABNORMAL LOW (ref >=60)
Globulin: 2.9 g/dL (calc) (ref 1.9–3.7)
Glucose, Bld: 92 mg/dL (ref 65–99)
Potassium: 3.9 mmol/L (ref 3.5–5.3)
Sodium: 141 mmol/L (ref 135–146)
Total Bilirubin: 0.8 mg/dL (ref 0.2–1.2)
Total Protein: 6.9 g/dL (ref 6.1–8.1)

## 2021-01-26 LAB — CBC WITH DIFFERENTIAL/PLATELET
Absolute Monocytes: 1463 cells/uL — ABNORMAL HIGH (ref 200–950)
Basophils Absolute: 59 cells/uL (ref 0–200)
Basophils Relative: 0.5 %
Eosinophils Absolute: 35 cells/uL (ref 15–500)
Eosinophils Relative: 0.3 %
HCT: 44.4 % (ref 35.0–45.0)
Hemoglobin: 14.7 g/dL (ref 11.7–15.5)
Lymphs Abs: 1310 cells/uL (ref 850–3900)
MCH: 31.7 pg (ref 27.0–33.0)
MCHC: 33.1 g/dL (ref 32.0–36.0)
MCV: 95.7 fL (ref 80.0–100.0)
MPV: 12.4 fL (ref 7.5–12.5)
Monocytes Relative: 12.4 %
Neutro Abs: 8933 cells/uL — ABNORMAL HIGH (ref 1500–7800)
Neutrophils Relative %: 75.7 %
Platelets: 262 10*3/uL (ref 140–400)
RBC: 4.64 10*6/uL (ref 3.80–5.10)
RDW: 11.8 % (ref 11.0–15.0)
Total Lymphocyte: 11.1 %
WBC: 11.8 10*3/uL — ABNORMAL HIGH (ref 3.8–10.8)

## 2021-01-26 NOTE — Telephone Encounter (Signed)
-----   Message from Lauree Chandler, NP sent at 01/26/2021  9:42 AM EDT ----- The white count is elevated; oral steroids can do this, inflammatory process, viral or bacterial infection but please report any signs or symptoms of infection such as fever, sputum production, nasal discharge,pain with urination, diarrhea, etc. Lets follow up cbc in 1 week to make sure this has stabilized.  Kidney function is stable, improved from 4 months.  Electrolytes, liver function stable.

## 2021-01-26 NOTE — Telephone Encounter (Signed)
Patient daughter notified and agreed.  Scheduled follow up CBC for 02/02/21 Order placed.

## 2021-02-02 ENCOUNTER — Other Ambulatory Visit: Payer: Medicare Other

## 2021-02-02 ENCOUNTER — Other Ambulatory Visit: Payer: Self-pay

## 2021-02-02 DIAGNOSIS — D729 Disorder of white blood cells, unspecified: Secondary | ICD-10-CM

## 2021-02-02 LAB — CBC WITH DIFFERENTIAL/PLATELET
Absolute Monocytes: 963 cells/uL — ABNORMAL HIGH (ref 200–950)
Basophils Absolute: 81 cells/uL (ref 0–200)
Basophils Relative: 0.9 %
Eosinophils Absolute: 81 cells/uL (ref 15–500)
Eosinophils Relative: 0.9 %
HCT: 43.7 % (ref 35.0–45.0)
Hemoglobin: 14.3 g/dL (ref 11.7–15.5)
Lymphs Abs: 1494 cells/uL (ref 850–3900)
MCH: 31.8 pg (ref 27.0–33.0)
MCHC: 32.7 g/dL (ref 32.0–36.0)
MCV: 97.1 fL (ref 80.0–100.0)
MPV: 11.9 fL (ref 7.5–12.5)
Monocytes Relative: 10.7 %
Neutro Abs: 6381 cells/uL (ref 1500–7800)
Neutrophils Relative %: 70.9 %
Platelets: 294 10*3/uL (ref 140–400)
RBC: 4.5 10*6/uL (ref 3.80–5.10)
RDW: 11.8 % (ref 11.0–15.0)
Total Lymphocyte: 16.6 %
WBC: 9 10*3/uL (ref 3.8–10.8)

## 2021-03-16 ENCOUNTER — Ambulatory Visit (INDEPENDENT_AMBULATORY_CARE_PROVIDER_SITE_OTHER): Payer: Medicare Other | Admitting: Family

## 2021-03-16 ENCOUNTER — Other Ambulatory Visit: Payer: Self-pay

## 2021-03-16 ENCOUNTER — Encounter: Payer: Self-pay | Admitting: Family

## 2021-03-16 ENCOUNTER — Ambulatory Visit: Payer: Medicare Other | Admitting: Family

## 2021-03-16 VITALS — BP 140/98 | HR 79 | Temp 97.3°F | Resp 16 | Ht 64.0 in | Wt 91.4 lb

## 2021-03-16 DIAGNOSIS — G8929 Other chronic pain: Secondary | ICD-10-CM

## 2021-03-16 DIAGNOSIS — R399 Unspecified symptoms and signs involving the genitourinary system: Secondary | ICD-10-CM | POA: Diagnosis not present

## 2021-03-16 DIAGNOSIS — M545 Low back pain, unspecified: Secondary | ICD-10-CM

## 2021-03-16 DIAGNOSIS — I10 Essential (primary) hypertension: Secondary | ICD-10-CM | POA: Diagnosis not present

## 2021-03-16 DIAGNOSIS — L853 Xerosis cutis: Secondary | ICD-10-CM

## 2021-03-16 LAB — POCT URINALYSIS DIPSTICK
Bilirubin, UA: NEGATIVE
Glucose, UA: NEGATIVE
Ketones, UA: POSITIVE
Nitrite, UA: NEGATIVE
Protein, UA: NEGATIVE
Spec Grav, UA: 1.02 (ref 1.010–1.025)
Urobilinogen, UA: NEGATIVE E.U./dL — AB
pH, UA: 5 (ref 5.0–8.0)

## 2021-03-16 MED ORDER — ACETAMINOPHEN 500 MG PO TABS: 500.0000 mg | ORAL_TABLET | Freq: Three times a day (TID) | ORAL | 0 refills | Status: DC | PRN

## 2021-03-16 MED ORDER — AMLODIPINE BESYLATE 2.5 MG PO TABS: 5.0000 mg | ORAL_TABLET | Freq: Every day | ORAL | 0 refills | Status: DC

## 2021-03-16 MED ORDER — AQUAPHOR EX OINT: TOPICAL_OINTMENT | CUTANEOUS | 0 refills | Status: AC | PRN

## 2021-03-16 NOTE — Patient Instructions (Signed)
-   please get lower back X-ray done at [place] at [facility] then will call you with results. - urine specimen send to lab will call you with results in 2-3 days   - Take Extra Strength Tylenol 500 mg tablet every 8 hrs for lower back pain   - Start on Amlodipine 2.5 mg tablet one by mouth daily for high blood pressure hen follow up in 2 weeks to recheck blood pressure

## 2021-03-16 NOTE — Progress Notes (Signed)
Provider: Alanny Rivers FNP-C  Lauree Chandler, NP  Patient Care Team: Lauree Chandler, NP as PCP - General (Geriatric Medicine)  Extended Emergency Contact Information Primary Emergency Contact: Shaney, Deck[place] Mobile Phone: [phone] Relation: Spouse Preferred language: [name] Secondary Emergency Contact: [name] Address: [place], [place] [address] Johnnette Litter of [place] Mobile Phone: [phone] Relation: Daughter Preferred language: English Interpreter needed? No  Code Status:  Full Code  Goals of care: Advanced Directive information Advanced Directives 03/16/2021  Does Patient Have a Medical Advance Directive? Yes  Type of Advance Directive [place]  Does patient want to make changes to medical advance directive? No - Patient declined  Copy of [place] in Chart? Yes - validated most recent copy scanned in chart (See row information)     Chief Complaint  Patient presents with   Follow-up    Follow up on back pain.     HPI:  Pt is a 87 y.o. female seen today for an acute visit for evaluation of worsening lower back pain for the past two months.back pain has worsen in the past two weeks.Has been more exhausted and tired lately.son wonders whether some of her medication is making her tired.  She is here with husband and son.States need Blood pressure check has been high.Previous B/p was 140/90 consistent with today's b/p 140/98 unclear if pain could be a contributory factor.    Past Medical History:  Diagnosis Date   Abnormal thyroid blood test 11/04/2010   Dr.Berglind's lab results indicated TSH was low, Per records provided by patient at new patient appointment    Alzheimer disease [facility])    Per [facility] New Patient packet   Angle-closure glaucoma    Per records provided by patient at new patient appointment    Arthritis    Per [facility] New Patient packet   Breast cancer ([place])    Chemo  04/2001-07/2001, Per [facility] New Patient packet   Chronic kidney disease    Per [facility] New Patient packet   Chronic UTI    Per [facility] New Patient packet   Colon polyps    Per [place] Patient packet   Dementia [facility])    Per [facility] New Patient packet   Eczema    Per [facility] New Patient packet   Glaucoma    Per [facility] New Patient packet   H/O angiography 02/2019   Per [place] Patient Packet   H/O CT scan of head 01/2020   Per [place] New Patient Packet   H/O cystoscopy 03/12/2014   Dr.Kelly Venia Minks, Per [facility] New Patient packet   H/O laser iridotomy 06/2015   Per [facility] New Patient Packet   Hematuria    Per [facility] New Patient packet   Henoch-Schonlein purpura [facility])    Dr.Heher, Per [facility] New Patient packet   Hives    Per records provided by patient at new patient appointment    Hypertension    Per [facility] New Patient packet   Low TSH level    Memory loss    Over the past 6 years (as of 2021), Per [facility] New Patient packet   Osteoporosis    Per [facility] New Patient packet   Retinal vein occlusion of left eye    Per [facility] New Patient packet   Thyroid disease    Per [facility] New Patient packet   Vitamin D deficiency    Per [facility] New Patient  packet   Past Surgical History:  Procedure Laterality Date   ABDOMINAL HYSTERECTOMY     Fibroid tumor. Patient with ovaries. Per [facility] New Patient packet   BIOPSY THYROID     Dr.Rahmani, Per [facility] New Patient packet   BREAST BIOPSY  02/1987   Per [facility] New Patient Packet   BREAST BIOPSY  01/2001   Per [place] New Patient packet   COLONOSCOPY  05/17/2017   Per [facility] New Patient packet, performed by Dr. Steffanie Dunn at [facility]. Polyp was removed.   MASTECTOMY  02/2001   Per [facility] New Patient packet   MASTECTOMY MODIFIED RADICAL  03/1987   Per [facility] New Patient packet   RENAL BIOPSY  2003   Done in [place]. Per records provided by patient at new patient appointment    TONSILLECTOMY  1944   Per [facility] New Patient packet    Allergies  Allergen Reactions   Nsaids    Ampicillin     Arimidex [Anastrozole] Other (See Comments)    Kidney problems    Azithromycin    Celecoxib    Fosamax [Alendronate] Other (See Comments)    Kidney problem    Ibuprofen    Naproxen    Penicillins    Sulfa Antibiotics    Tetracyclines & Related    Losartan Rash    Outpatient Encounter Medications as of 03/16/2021  Medication Sig   b complex vitamins capsule Take 1 capsule by mouth daily.   Carboxymeth-Glyc-Polysorb PF (REFRESH OPTIVE ADVANCED PF) 0.5-1-0.5 % SOLN Apply 1 drop to eye in the morning and at bedtime.   dorzolamidel-timolol (COSOPT) 22.3-6.8 MG/ML SOLN ophthalmic solution Place 1 drop into both eyes 2 (two) times daily.   latanoprost (XALATAN) 0.005 % ophthalmic solution Place 1 drop into both eyes at bedtime.   memantine (NAMENDA) 10 MG tablet Take 1 tablet (10 mg total) by mouth 2 (two) times daily.   metoprolol succinate (TOPROL-XL) 100 MG 24 hr tablet Take 1 tablet (100 mg total) by mouth daily. Take with or immediately following a meal.   triamcinolone cream (KENALOG) 0.1 % Apply 1 application topically 2 (two) times daily.   VITAMIN D, CHOLECALCIFEROL, PO Take 2,000 Units by mouth daily. Liquid   No facility-administered encounter medications on file as of 03/16/2021.    Review of Systems  Constitutional:  Positive for fatigue. Negative for appetite change, chills, fever and unexpected weight change.  HENT:  Negative for congestion, dental problem, ear discharge, ear pain, facial swelling, hearing loss, nosebleeds, postnasal drip, rhinorrhea, sinus pressure, sinus pain, sneezing, sore throat, tinnitus and trouble swallowing.   Eyes:  Negative for pain, discharge, redness, itching and visual disturbance.  Respiratory:  Negative for cough, chest tightness, shortness of breath and wheezing.   Cardiovascular:  Negative for chest pain, palpitations and leg swelling.  Gastrointestinal:  Negative for abdominal distention, abdominal pain, blood in stool, constipation,  diarrhea, nausea and vomiting.  Genitourinary:  Positive for dysuria. Negative for difficulty urinating, flank pain, frequency and urgency.  Musculoskeletal:  Positive for arthralgias, back pain and gait problem. Negative for joint swelling, myalgias, neck pain and neck stiffness.  Skin:  Negative for color change, pallor, rash and wound.  Neurological:  Negative for dizziness, syncope, speech difficulty, weakness, light-headedness, numbness and headaches.  Hematological:  Does not bruise/bleed easily.  Psychiatric/Behavioral:  Positive for confusion. Negative for agitation, behavioral problems, hallucinations and sleep disturbance. The patient is not nervous/anxious.    Immunization History  Administered Date(s) Administered  Fluad Quad(high Dose 65+) 06/19/2020   PFIZER(Purple Top)SARS-COV-2 Vaccination 11/05/2019, 11/28/2019, 07/15/2020   Pneumococcal-Unspecified 02/15/2011   Pertinent  Health Maintenance Due  Topic Date Due   DEXA SCAN  Never done   PNA vac Low Risk Adult (2 of 2 - PCV13) 02/15/2012   INFLUENZA VACCINE  04/05/2021   Fall Risk  03/16/2021 01/25/2021 12/04/2020 11/25/2020 09/23/2020  Falls in the past year? 0 0 0 0 0  Number falls in past yr: 0 0 0 0 0  Injury with Fall? 0 0 0 0 0  Risk for fall due to : No Fall Risks - - - -   Functional Status Survey:    Vitals:   03/16/21 1601  BP: (!) 140/98  Pulse: 79  Resp: 16  Temp: (!) 97.3 F (36.3 C)  SpO2: 95%  Weight: 91 lb 6.4 oz (41.5 kg)  Height: 5\' 4"  (1.626 m)   Body mass index is 15.69 kg/m. Physical Exam Vitals reviewed.  Constitutional:      General: She is not in acute distress.    Appearance: Normal appearance. She is normal weight. She is not ill-appearing or diaphoretic.  HENT:     Head: Normocephalic.  Eyes:     General: No scleral icterus.       Right eye: No discharge.        Left eye: No discharge.     Extraocular Movements: Extraocular movements intact.     Conjunctiva/sclera:  Conjunctivae normal.     Pupils: Pupils are equal, round, and reactive to light.  Neck:     Vascular: No carotid bruit.  Cardiovascular:     Rate and Rhythm: Normal rate and regular rhythm.     Pulses: Normal pulses.     Heart sounds: Normal heart sounds. No murmur heard.   No friction rub. No gallop.  Pulmonary:     Effort: Pulmonary effort is normal. No respiratory distress.     Breath sounds: Normal breath sounds. No wheezing, rhonchi or rales.  Chest:     Chest wall: No tenderness.  Abdominal:     General: Bowel sounds are normal. There is no distension.     Palpations: Abdomen is soft. There is no mass.     Tenderness: There is no abdominal tenderness. There is no right CVA tenderness, left CVA tenderness, guarding or rebound.  Musculoskeletal:        General: No swelling or tenderness. Normal range of motion.     Cervical back: Normal range of motion. No rigidity or tenderness.     Right lower leg: No edema.     Left lower leg: No edema.  Lymphadenopathy:     Cervical: No cervical adenopathy.  Skin:    General: Skin is warm and dry.     Coloration: Skin is not pale.     Findings: No bruising, erythema, lesion or rash.  Neurological:     Mental Status: She is alert and oriented to person, place, and time.     Motor: No weakness.     Coordination: Coordination normal.     Gait: Gait abnormal.  Psychiatric:        Mood and Affect: Mood normal.        Speech: Speech normal.        Behavior: Behavior normal.        Cognition and Memory: Cognition is impaired. Memory is impaired.        Judgment: Judgment normal.    Labs reviewed: Recent  Labs    05/20/20 0000 09/17/20 1034 01/25/21 1157  NA 144 144 141  K 4.0 4.1 3.9  CL 107 108 102  CO2 27* 28 27  GLUCOSE  --  87 92  BUN 29* 42* 24  CREATININE 1.0 1.06* 0.90*  CALCIUM 9.5 10.0 9.5   Recent Labs    05/20/20 0000 09/17/20 1034 01/25/21 1157  AST 16 25 14   ALT 16 24 12   ALKPHOS 71  --   --   BILITOT  --   0.6 0.8  PROT  --  7.4 6.9  ALBUMIN 4.5  --   --    Recent Labs    12/23/20 1158 01/25/21 1157 02/02/21 1039  WBC 9.0 11.8* 9.0  NEUTROABS 5,904 8,933* 6,381  HGB 14.4 14.7 14.3  HCT 43.7 44.4 43.7  MCV 96.0 95.7 97.1  PLT 225 262 294   Lab Results  Component Value Date   TSH 1.71 09/17/2020   No results found for: HGBA1C Lab Results  Component Value Date   CHOL 202 (A) 05/02/2019   HDL 76 (A) 05/02/2019   LDLCALC 109 05/02/2019   TRIG 83 05/02/2019    Significant Diagnostic Results in last 30 days:  No results found.  Assessment/Plan 1. Chronic bilateral low back pain without sciatica Chronic but has worsen in the past two weeks.possible UTI but will rule out fracture. Or DDD. - advised to get lower back X-ray done at [place] [facility] then will call you with results. - DG Lumbar Spine Complete; Future - acetaminophen (TYLENOL) 500 MG tablet; Take 1 tablet (500 mg total) by mouth every 8 (eight) hours as needed for moderate pain.  Dispense: 30 tablet; Refill: 0  2. Dry skin dermatitis Bilateral lower extremities.Apply Moisturizer cream or Aquaphor as below.  - mineral oil-hydrophilic petrolatum (AQUAPHOR) ointment; Apply topically as needed for dry skin. Both legs  Dispense: 420 g; Refill: 0  3. Symptoms of urinary tract infection Dysuria with worsening lower back pain. - POC Urinalysis Dipstick shows yellow cloudy urine positive for ketones,trace blood and Leukocytes but negative for Nitrites.will send urine for culture.made aware culture will call with results. - Notify provider if symptoms worsen  - Urine Culture  4. Essential hypertension B/p elevated  Asymptomatic. Start on amlodipine as below SE discussed. follow up in 2 weeks to recheck B/p  - amLODipine (NORVASC) 2.5 MG tablet; Take 2 tablets (5 mg total) by mouth daily.  Dispense: 30 tablet; Refill: 0    Family/ staff Communication: Reviewed plan of care with patient  ,Husband and son verbalized understanding   Labs/tests ordered:  - DG Lumbar Spine Complete; Future - Urine Culture - POC Urinalysis Dipstick   Next Appointment: 2 weeks for blood pressure check   Amarria Andreasen C Elese Rane, NP

## 2021-03-17 LAB — URINE CULTURE
MICRO NUMBER:: 12109736
SPECIMEN QUALITY:: ADEQUATE

## 2021-03-19 ENCOUNTER — Encounter: Payer: Self-pay | Admitting: Family

## 2021-03-19 ENCOUNTER — Ambulatory Visit (INDEPENDENT_AMBULATORY_CARE_PROVIDER_SITE_OTHER): Payer: Medicare Other | Admitting: Family

## 2021-03-19 ENCOUNTER — Other Ambulatory Visit: Payer: Self-pay

## 2021-03-19 VITALS — BP 140/88 | HR 65 | Temp 97.3°F | Resp 16 | Ht 64.0 in | Wt 91.6 lb

## 2021-03-19 DIAGNOSIS — R399 Unspecified symptoms and signs involving the genitourinary system: Secondary | ICD-10-CM

## 2021-03-19 DIAGNOSIS — R410 Disorientation, unspecified: Secondary | ICD-10-CM | POA: Diagnosis not present

## 2021-03-19 LAB — POCT URINALYSIS DIPSTICK
Bilirubin, UA: NEGATIVE
Blood, UA: NEGATIVE
Glucose, UA: NEGATIVE
Nitrite, UA: NEGATIVE
Protein, UA: NEGATIVE
Spec Grav, UA: 1.01 (ref 1.010–1.025)
Urobilinogen, UA: NEGATIVE E.U./dL — AB
pH, UA: 7.5 (ref 5.0–8.0)

## 2021-03-19 MED ORDER — NITROFURANTOIN MONOHYD MACRO 100 MG PO CAPS: 100.0000 mg | ORAL_CAPSULE | Freq: Two times a day (BID) | ORAL | 0 refills | Status: AC

## 2021-03-19 NOTE — Patient Instructions (Addendum)
-   Take antibiotics Nitrofurantoin 100 mg tablet one by mouth twice daily x 7 days   - Urine specimen send for culture and sensitivity if results comes back not sensitive to Nitrofurantoin will call in a different antibiotics.  - Labs done today will call you with results   - vitamin D was within normal range January,2022 no repeat today.

## 2021-03-20 LAB — BASIC METABOLIC PANEL WITH GFR
BUN/Creatinine Ratio: 30 (calc) — ABNORMAL HIGH (ref 6–22)
BUN: 27 mg/dL — ABNORMAL HIGH (ref 7–25)
CO2: 29 mmol/L (ref 20–32)
Calcium: 9.8 mg/dL (ref 8.6–10.4)
Chloride: 104 mmol/L (ref 98–110)
Creat: 0.91 mg/dL (ref 0.60–0.95)
Glucose, Bld: 95 mg/dL (ref 65–99)
Potassium: 3.7 mmol/L (ref 3.5–5.3)
Sodium: 142 mmol/L (ref 135–146)
eGFR: 62 mL/min/{1.73_m2} (ref >=60)

## 2021-03-20 LAB — CBC WITH DIFFERENTIAL/PLATELET
Absolute Monocytes: 1074 cells/uL — ABNORMAL HIGH (ref 200–950)
Basophils Absolute: 48 cells/uL (ref 0–200)
Basophils Relative: 0.5 %
Eosinophils Absolute: 124 cells/uL (ref 15–500)
Eosinophils Relative: 1.3 %
HCT: 43.2 % (ref 35.0–45.0)
Hemoglobin: 14.7 g/dL (ref 11.7–15.5)
Lymphs Abs: 2024 cells/uL (ref 850–3900)
MCH: 31.5 pg (ref 27.0–33.0)
MCHC: 34 g/dL (ref 32.0–36.0)
MCV: 92.7 fL (ref 80.0–100.0)
MPV: 11.9 fL (ref 7.5–12.5)
Monocytes Relative: 11.3 %
Neutro Abs: 6232 cells/uL (ref 1500–7800)
Neutrophils Relative %: 65.6 %
Platelets: 280 10*3/uL (ref 140–400)
RBC: 4.66 10*6/uL (ref 3.80–5.10)
RDW: 12.3 % (ref 11.0–15.0)
Total Lymphocyte: 21.3 %
WBC: 9.5 10*3/uL (ref 3.8–10.8)

## 2021-03-21 LAB — URINE CULTURE
MICRO NUMBER:: 12127871
Result:: NO GROWTH
SPECIMEN QUALITY:: ADEQUATE

## 2021-03-21 NOTE — Progress Notes (Signed)
Provider: Lyndzee Kliebert FNP-C  Lauree Chandler, NP  Patient Care Team: Lauree Chandler, NP as PCP - General (Geriatric Medicine)  Extended Emergency Contact Information Primary Emergency Contact: Parlee, [place] Mobile Phone: [phone] Relation: Spouse Preferred language: [name] Secondary Emergency Contact: [name] Address: [place], [place] [address] Johnnette Litter of [place] Mobile Phone: [phone] Relation: Daughter Preferred language: English Interpreter needed? No  Code Status: Full Code  Goals of care: Advanced Directive information Advanced Directives 03/19/2021  Does Patient Have a Medical Advance Directive? Yes  Type of Advance Directive [place]  Does patient want to make changes to medical advance directive? No - Patient declined  Copy of [place] in Chart? Yes - validated most recent copy scanned in chart (See row information)     Chief Complaint  Patient presents with   Follow-up    Patient needs to repeat urine culture, possibly start antibiotics, and Blood work.     HPI:  Pt is a 87 y.o. female seen today for an acute visit to repeat urine culture.she was here 03/16/2021 with complains of lower back pain and symptoms of urinary tract infection.Urine specimen was collected but final culture was contaminated.she is here to recollect urine for culture.she is here with son and Husband.daughter send some written concerns states patient has been more confused.would like labs and if possible start on antibiotics.   Past Medical History:  Diagnosis Date   Abnormal thyroid blood test 11/04/2010   Dr.Berglind's lab results indicated TSH was low, Per records provided by patient at new patient appointment    Alzheimer disease [facility])    Per [facility] New Patient packet   Angle-closure glaucoma    Per records provided by patient at new patient appointment    Arthritis    Per [facility] New Patient packet    Breast cancer ([place])    Chemo 04/2001-07/2001, Per [facility] New Patient packet   Chronic kidney disease    Per [facility] New Patient packet   Chronic UTI    Per [facility] New Patient packet   Colon polyps    Per [facility] New Patient packet   Dementia [facility])    Per [facility] New Patient packet   Eczema    Per [facility] New Patient packet   Glaucoma    Per [facility] New Patient packet   H/O angiography 02/2019   Per [place] Patient Packet   H/O CT scan of head 01/2020   Per [place] New Patient Packet   H/O cystoscopy 03/12/2014   Dr.Kelly Venia Minks, Per [facility] New Patient packet   H/O laser iridotomy 06/2015   Per [facility] New Patient Packet   Hematuria    Per [facility] New Patient packet   Henoch-Schonlein purpura [facility])    Dr.Heher, Per [facility] New Patient packet   Hives    Per records provided by patient at new patient appointment    Hypertension    Per [facility] New Patient packet   Low TSH level    Memory loss    Over the past 6 years (as of 2021), Per [facility] New Patient packet   Osteoporosis    Per [facility] New Patient packet   Retinal vein occlusion of left eye    Per [facility] New Patient packet   Thyroid disease    Per [facility] New Patient packet   Vitamin D deficiency    Per [facility] New Patient packet   Past  Surgical History:  Procedure Laterality Date   ABDOMINAL HYSTERECTOMY     Fibroid tumor. Patient with ovaries. Per [facility] New Patient packet   BIOPSY THYROID     Dr.Rahmani, Per [facility] New Patient packet   BREAST BIOPSY  02/1987   Per [facility] New Patient Packet   BREAST BIOPSY  01/2001   Per [place] New Patient packet   COLONOSCOPY  05/17/2017   Per [facility] New Patient packet, performed by Dr. Steffanie Dunn at [facility]. Polyp was removed.   MASTECTOMY  02/2001   Per [facility] New Patient packet   MASTECTOMY MODIFIED RADICAL  03/1987   Per [facility] New Patient packet   RENAL BIOPSY  2003   Done in [place]. Per records provided by patient at new patient appointment    TONSILLECTOMY  1944   Per [facility] New Patient packet    Allergies  Allergen  Reactions   Nsaids    Ampicillin    Arimidex [Anastrozole] Other (See Comments)    Kidney problems    Azithromycin    Celecoxib    Fosamax [Alendronate] Other (See Comments)    Kidney problem    Ibuprofen    Naproxen    Penicillins    Sulfa Antibiotics    Tetracyclines & Related    Losartan Rash    Outpatient Encounter Medications as of 03/19/2021  Medication Sig   acetaminophen (TYLENOL) 500 MG tablet Take 1 tablet (500 mg total) by mouth every 8 (eight) hours as needed for moderate pain.   amLODipine (NORVASC) 2.5 MG tablet Take 2 tablets (5 mg total) by mouth daily.   b complex vitamins capsule Take 1 capsule by mouth daily.   Carboxymeth-Glyc-Polysorb PF (REFRESH OPTIVE ADVANCED PF) 0.5-1-0.5 % SOLN Apply 1 drop to eye in the morning and at bedtime.   dorzolamidel-timolol (COSOPT) 22.3-6.8 MG/ML SOLN ophthalmic solution Place 1 drop into both eyes 2 (two) times daily.   latanoprost (XALATAN) 0.005 % ophthalmic solution Place 1 drop into both eyes at bedtime.   memantine (NAMENDA) 10 MG tablet Take 1 tablet (10 mg total) by mouth 2 (two) times daily.   metoprolol succinate (TOPROL-XL) 100 MG 24 hr tablet Take 1 tablet (100 mg total) by mouth daily. Take with or immediately following a meal.   mineral oil-hydrophilic petrolatum (AQUAPHOR) ointment Apply topically as needed for dry skin. Both legs   nitrofurantoin, macrocrystal-monohydrate, (MACROBID) 100 MG capsule Take 1 capsule (100 mg total) by mouth 2 (two) times daily for 7 days.   triamcinolone cream (KENALOG) 0.1 % Apply 1 application topically 2 (two) times daily.   VITAMIN D, CHOLECALCIFEROL, PO Take 2,000 Units by mouth daily. Liquid   No facility-administered encounter medications on file as of 03/19/2021.    Review of Systems  Unable to perform ROS: Dementia (additional information provided by husband and son)  Constitutional:  Negative for appetite change, chills, fatigue, fever and unexpected weight change.  HENT:   Negative for congestion, dental problem, ear discharge, ear pain, facial swelling, hearing loss, nosebleeds, postnasal drip, rhinorrhea, sinus pressure, sinus pain, sneezing, sore throat, tinnitus and trouble swallowing.   Eyes:  Negative for pain, discharge, redness, itching and visual disturbance.  Respiratory:  Negative for cough, chest tightness, shortness of breath and wheezing.   Cardiovascular:  Negative for chest pain, palpitations and leg swelling.  Gastrointestinal:  Negative for abdominal distention, abdominal pain, constipation, diarrhea, nausea and vomiting.  Endocrine: Negative for cold intolerance, heat intolerance, polydipsia, polyphagia and polyuria.  Genitourinary:  Negative for difficulty urinating, dysuria, flank pain, frequency and urgency.  Musculoskeletal:  Positive for arthralgias, back pain and gait problem. Negative for joint swelling, myalgias, neck pain and neck stiffness.  Skin:  Negative for color change, pallor, rash and wound.  Neurological:  Negative for dizziness, syncope, speech difficulty, weakness, light-headedness, numbness and headaches.  Hematological:  Does not bruise/bleed easily.  Psychiatric/Behavioral:  Negative for agitation, behavioral problems, hallucinations and sleep disturbance. The patient is not nervous/anxious.        Confusion has worsen    Immunization History  Administered Date(s) Administered   Fluad Quad(high Dose 65+) 06/19/2020   PFIZER(Purple Top)SARS-COV-2 Vaccination 11/05/2019, 11/28/2019, 07/15/2020   Pneumococcal-Unspecified 02/15/2011   Pertinent  Health Maintenance Due  Topic Date Due   DEXA SCAN  Never done   PNA vac Low Risk Adult (2 of 2 - PCV13) 02/15/2012   INFLUENZA VACCINE  04/05/2021   Fall Risk  03/19/2021 03/16/2021 01/25/2021 12/04/2020 11/25/2020  Falls in the past year? 0 0 0 0 0  Number falls in past yr: 0 0 0 0 0  Injury with Fall? 0 0 0 0 0  Risk for fall due to : No Fall Risks No Fall Risks - - -  Follow up  Falls evaluation completed - - - -   Functional Status Survey:    Vitals:   03/19/21 1422  BP: 140/88  Pulse: 65  Resp: 16  Temp: (!) 97.3 F (36.3 C)  SpO2: 96%  Weight: 91 lb 9.6 oz (41.5 kg)  Height: _0  (1.626 m)   Body mass index is 15.72 kg/m. Physical Exam Vitals reviewed.  Constitutional:      General: She is not in acute distress.    Appearance: Normal appearance. She is underweight. She is not ill-appearing or diaphoretic.  HENT:     Head: Normocephalic.     Right Ear: Tympanic membrane, ear canal and external ear normal. There is no impacted cerumen.     Left Ear: Tympanic membrane, ear canal and external ear normal. There is no impacted cerumen.     Nose: Nose normal. No congestion or rhinorrhea.     Mouth/Throat:     Mouth: Mucous membranes are moist.     Pharynx: Oropharynx is clear. No oropharyngeal exudate or posterior oropharyngeal erythema.  Eyes:     General: No scleral icterus.       Right eye: No discharge.        Left eye: No discharge.     Extraocular Movements: Extraocular movements intact.     Conjunctiva/sclera: Conjunctivae normal.     Pupils: Pupils are equal, round, and reactive to light.  Neck:     Vascular: No carotid bruit.  Cardiovascular:     Rate and Rhythm: Normal rate and regular rhythm.     Pulses: Normal pulses.     Heart sounds: Normal heart sounds. No murmur heard.   No friction rub. No gallop.  Pulmonary:     Effort: Pulmonary effort is normal. No respiratory distress.     Breath sounds: Normal breath sounds. No wheezing, rhonchi or rales.  Chest:     Chest wall: No tenderness.  Abdominal:     General: Bowel sounds are normal. There is no distension.     Palpations: Abdomen is soft. There is no mass.     Tenderness: There is no abdominal tenderness. There is no right CVA tenderness, left CVA tenderness, guarding or rebound.  Musculoskeletal:  General: No swelling.     Cervical back: Normal range of motion. No  rigidity or tenderness.     Lumbar back: No swelling, edema, tenderness or bony tenderness. Normal range of motion. Negative right straight leg raise test and negative left straight leg raise test.     Comments: Unsteady gait   Lymphadenopathy:     Cervical: No cervical adenopathy.  Skin:    General: Skin is warm and dry.     Coloration: Skin is not pale.     Findings: No bruising, erythema, lesion or rash.  Neurological:     Mental Status: She is alert and oriented to person, place, and time.     Cranial Nerves: No cranial nerve deficit.     Sensory: No sensory deficit.     Motor: No weakness.     Coordination: Coordination normal.     Gait: Gait normal.  Psychiatric:        Mood and Affect: Mood normal.        Speech: Speech normal.        Behavior: Behavior normal.        Thought Content: Thought content normal.        Judgment: Judgment normal.    Labs reviewed: Recent Labs    09/17/20 1034 01/25/21 1157 03/19/21 1524  NA 144 141 142  K 4.1 3.9 3.7  CL 108 102 104  CO2 _0 GLUCOSE 87 92 95  BUN 42* 24 27*  CREATININE 1.06* 0.90* 0.91  CALCIUM 10.0 9.5 9.8   Recent Labs    05/20/20 0000 09/17/20 1034 01/25/21 1157  AST _1 ALT _2 ALKPHOS 71  --   --   BILITOT  --  0.6 0.8  PROT  --  7.4 6.9  ALBUMIN 4.5  --   --    Recent Labs    01/25/21 1157 02/02/21 1039 03/19/21 1524  WBC 11.8* 9.0 9.5  NEUTROABS 8,933* 6,381 6,232  HGB 14.7 14.3 14.7  HCT 44.4 43.7 43.2  MCV 95.7 97.1 92.7  PLT 262 294 280   Lab Results  Component Value Date   TSH 1.71 09/17/2020   No results found for: HGBA1C Lab Results  Component Value Date   CHOL 202 (A) 05/02/2019   HDL 76 (A) 05/02/2019   LDLCALC 109 05/02/2019   TRIG 83 05/02/2019    Significant Diagnostic Results in last 30 days:  No results found.  Assessment/Plan  1. Symptoms of urinary tract infection Afebrile. Recent urine culture indicated contamination. Has had increased  confusion. - POC Urinalysis Dipstick shows yellow clear urine with large leukocytes but negative for nitrites. Will treat send urine for culture and treat clinically with oral antibiotics. - Urine Culture - nitrofurantoin, macrocrystal-monohydrate, (MACROBID) 100 MG capsule; Take 1 capsule (100 mg total) by mouth 2 (two) times daily for 7 days.  Dispense: 14 capsule; Refill: 0  2. Confusion Suspect possible UTI but will rule out other acute and metabolic etiologies  - CBC with Differential/Platelet - BMP with eGFR([facility])  Family/ staff Communication: Reviewed plan of care with patient verbalized understanding.   Labs/tests ordered:  - CBC with Differential/Platelet - BMP with eGFR([facility]) - Urine Culture - POC Urinalysis Dipstick   Next Appointment: As needed if symptoms worsen or fail to improve    Sandrea Hughs, NP

## 2021-03-23 ENCOUNTER — Other Ambulatory Visit: Payer: Self-pay | Admitting: Family

## 2021-03-23 DIAGNOSIS — I10 Essential (primary) hypertension: Secondary | ICD-10-CM

## 2021-03-29 ENCOUNTER — Ambulatory Visit
Admission: RE | Admit: 2021-03-29 | Discharge: 2021-03-29 | Disposition: A | Discharge status: Home / Self Care | Payer: Medicare Other | Source: Ambulatory Visit | Attending: Family | Admitting: Family

## 2021-03-29 DIAGNOSIS — G8929 Other chronic pain: Secondary | ICD-10-CM

## 2021-04-01 ENCOUNTER — Other Ambulatory Visit: Payer: Self-pay | Admitting: Family

## 2021-04-01 ENCOUNTER — Telehealth: Payer: Self-pay | Admitting: *Deleted

## 2021-04-01 DIAGNOSIS — M5136 Other intervertebral disc degeneration, lumbar region: Secondary | ICD-10-CM

## 2021-04-01 NOTE — Telephone Encounter (Signed)
-----   Message from Sandrea Hughs, NP sent at 03/31/2021  8:42 AM EDT ----- - lower back X-ray results shows a compression age-indeterminate.scoliosis and Degenerative disc disease also noted. Recommend referral to Orthopedic to evaluate compression and degenerative disc disease.

## 2021-04-01 NOTE — Telephone Encounter (Signed)
Referral pended and sent to [facility] for approval.

## 2021-04-01 NOTE — Telephone Encounter (Signed)
Referral to Orthopedic ordered.

## 2021-04-13 ENCOUNTER — Ambulatory Visit: Payer: Medicare Other | Admitting: Family Medicine

## 2021-04-13 ENCOUNTER — Ambulatory Visit (INDEPENDENT_AMBULATORY_CARE_PROVIDER_SITE_OTHER): Payer: Medicare Other | Admitting: Family Medicine

## 2021-04-13 ENCOUNTER — Other Ambulatory Visit: Payer: Self-pay

## 2021-04-13 ENCOUNTER — Encounter: Payer: Self-pay | Admitting: Family Medicine

## 2021-04-13 DIAGNOSIS — G8929 Other chronic pain: Secondary | ICD-10-CM | POA: Diagnosis not present

## 2021-04-13 DIAGNOSIS — M545 Low back pain, unspecified: Secondary | ICD-10-CM | POA: Diagnosis not present

## 2021-04-13 NOTE — Progress Notes (Signed)
Office Visit Note   Patient: Emily Dawson           Date of Birth: Jan 24, 1934           MRN: [id number] Visit Date: 04/13/2021 Requested by: Sandrea Hughs, NP [address] [place],  [place] [address] PCP: Lauree Chandler, NP  Subjective: Chief Complaint  Patient presents with   Lower Back - Follow-up, Pain    Pain on/off since April, seems to be getting better. She is at [employer] ? if PT would help her     HPI: She is here with low back pain.  She is with her daughter today.  Patient has dementia and is not a good historian.  Apparently for the past several months she has intermittently been complaining of lower back pain.  Her activity level has been somewhat diminished.  There was no injury witnessed.  Because of her continued complaints, an x-ray was recently obtained which showed L1 and T11 compression fractures of uncertain age.  Patient does have a history of osteoporosis.  She is currently on vitamin D and her levels were excellent.  Patient denies any pain today, denies any radicular pain.              ROS:   All other systems were reviewed and are negative.  Objective: Vital Signs: LMP  (LMP Unknown)   Physical Exam:  General:  Alert and oriented, in no acute distress. Pulm:  Breathing unlabored. Psy:  Normal mood, congruent affect.  Low back: I am unable to reproduce any pain by palpating her thoracolumbar spinous processes and paraspinous muscles.  She does not seem to have any tenderness today.  Straight leg raise is negative, no pain with internal hip rotation.  She has 5/5 lower extremity strength and 1+ knee and ankle DTRs.    Imaging: No results found.  Assessment & Plan: Low back pain with degenerative disc disease and T11 and L1 compression fractures, age uncertain. -At this point I think she would benefit from home health physical therapy so I requested that for her. -Heating pad as needed. -If symptoms worsen, then MRI scan.  I will see her back  as needed.     Procedures: No procedures performed        PMFS History: Patient Active Problem List   Diagnosis Date Noted   Osteoporosis    Vitamin D deficiency    Dementia ([place])    Hypertension    Chronic kidney disease    Low TSH level    Past Medical History:  Diagnosis Date   Abnormal thyroid blood test 11/04/2010   Dr.Berglind's lab results indicated TSH was low, Per records provided by patient at new patient appointment    Alzheimer disease [facility])    Per [facility] New Patient packet   Angle-closure glaucoma    Per records provided by patient at new patient appointment    Arthritis    Per [facility] New Patient packet   Breast cancer ([place])    Chemo 04/2001-07/2001, Per [facility] New Patient packet   Chronic kidney disease    Per [facility] New Patient packet   Chronic UTI    Per [facility] New Patient packet   Colon polyps    Per [facility] New Patient packet   Dementia [facility])    Per [facility] New Patient packet   Eczema    Per [facility] New Patient packet   Glaucoma    Per [facility] New Patient packet   H/O  angiography 02/2019   Per [place] Patient Packet   H/O CT scan of head 01/2020   Per [place] New Patient Packet   H/O cystoscopy 03/12/2014   Dr.Kelly Venia Minks, Per [facility] New Patient packet   H/O laser iridotomy 06/2015   Per [facility] New Patient Packet   Hematuria    Per [facility] New Patient packet   Henoch-Schonlein purpura [facility])    Dr.Heher, Per [facility] New Patient packet   Hives    Per records provided by patient at new patient appointment    Hypertension    Per [facility] New Patient packet   Low TSH level    Memory loss    Over the past 6 years (as of 2021), Per [facility] New Patient packet   Osteoporosis    Per [facility] New Patient packet   Retinal vein occlusion of left eye    Per [facility] New Patient packet   Thyroid disease    Per [facility] New Patient packet   Vitamin D deficiency    Per [place] New Patient packet    Family History  Problem Relation Age of Onset   Cancer Mother    Heart disease Father    Hypertension Son     Hypertension Daughter     Past Surgical History:  Procedure Laterality Date   ABDOMINAL HYSTERECTOMY     Fibroid tumor. Patient with ovaries. Per [facility] New Patient packet   BIOPSY THYROID     Dr.Rahmani, Per [facility] New Patient packet   BREAST BIOPSY  02/1987   Per [facility] New Patient Packet   BREAST BIOPSY  01/2001   Per [place] New Patient packet   COLONOSCOPY  05/17/2017   Per [facility] New Patient packet, [facility] by Dr[facility] Steffanie [facility] at [facility]. Polyp was removed.   MASTECTOMY  02/2001   Per [facility] New Patient packet   MASTECTOMY MODIFIED RADICAL  03/1987   Per [facility] New Patient packet   RENAL BIOPSY  2003   Done in [place]. Per records provided by patient at new patient appointment    [place]   Per [place] Patient packet   Social History   Occupational History   Not on file  Tobacco Use   Smoking status: Never   Smokeless tobacco: Never  Vaping Use   Vaping Use: Never used  Substance and Sexual Activity   Alcohol use: Never   Drug use: Never   Sexual activity: Not on file

## 2021-04-21 ENCOUNTER — Other Ambulatory Visit: Payer: Self-pay | Admitting: Nurse Practitioner

## 2021-04-21 DIAGNOSIS — I10 Essential (primary) hypertension: Secondary | ICD-10-CM

## 2021-04-26 ENCOUNTER — Encounter: Payer: Self-pay | Admitting: Family Medicine

## 2021-05-02 ENCOUNTER — Other Ambulatory Visit: Payer: Self-pay | Admitting: Nurse Practitioner

## 2021-05-02 DIAGNOSIS — G309 Alzheimer's disease, unspecified: Secondary | ICD-10-CM

## 2021-05-02 DIAGNOSIS — F0280 Dementia in other diseases classified elsewhere without behavioral disturbance: Secondary | ICD-10-CM

## 2021-05-08 ENCOUNTER — Other Ambulatory Visit: Payer: Self-pay | Admitting: Nurse Practitioner

## 2021-05-08 DIAGNOSIS — I10 Essential (primary) hypertension: Secondary | ICD-10-CM

## 2021-05-27 ENCOUNTER — Encounter: Payer: PRIVATE HEALTH INSURANCE | Admitting: Nurse Practitioner

## 2021-05-27 ENCOUNTER — Encounter: Attending: Family Medicine | Primary: Family Medicine

## 2021-05-27 ENCOUNTER — Encounter: Payer: MEDICARE | Attending: Family Medicine | Primary: Family Medicine

## 2021-06-24 ENCOUNTER — Telehealth: Payer: Self-pay

## 2021-06-24 ENCOUNTER — Ambulatory Visit (INDEPENDENT_AMBULATORY_CARE_PROVIDER_SITE_OTHER): Payer: Medicare Other | Admitting: Nurse Practitioner

## 2021-06-24 ENCOUNTER — Encounter: Payer: Self-pay | Admitting: Nurse Practitioner

## 2021-06-24 ENCOUNTER — Other Ambulatory Visit: Payer: Self-pay

## 2021-06-24 DIAGNOSIS — Z0000 Encounter for general adult medical examination without abnormal findings: Secondary | ICD-10-CM | POA: Diagnosis not present

## 2021-06-24 DIAGNOSIS — F0280 Dementia in other diseases classified elsewhere without behavioral disturbance: Secondary | ICD-10-CM

## 2021-06-24 DIAGNOSIS — R636 Underweight: Secondary | ICD-10-CM

## 2021-06-24 DIAGNOSIS — G309 Alzheimer's disease, unspecified: Secondary | ICD-10-CM

## 2021-06-24 NOTE — Telephone Encounter (Signed)
Ms. Emily Dawson, Emily Dawson are scheduled for a virtual visit with your provider today.    Just as we do with appointments in the office, we must obtain your consent to participate.  Your consent will be active for this visit and any virtual visit you may have with one of our providers in the next 365 days.    If you have a MyChart account, I can also send a copy of this consent to you electronically.  All virtual visits are billed to your insurance company just like a traditional visit in the office.  As this is a virtual visit, video technology does not allow for your provider to perform a traditional examination.  This may limit your provider's ability to fully assess your condition.  If your provider identifies any concerns that need to be evaluated in person or the need to arrange testing such as labs, EKG, etc, we will make arrangements to do so.    Although advances in technology are sophisticated, we cannot ensure that it will always work on either your end or our end.  If the connection with a video visit is poor, we may have to switch to a telephone visit.  With either a video or telephone visit, we are not always able to ensure that we have a secure connection.   I need to obtain your verbal consent now.   Are you willing to proceed with your visit today?   Emily Dawson has provided verbal consent on 06/24/2021 for a virtual visit (video or telephone).   Carroll Kinds, CMA 06/24/2021  10:06 AM

## 2021-06-24 NOTE — Patient Instructions (Signed)
Ms. Co , Thank you for taking time to come for your Medicare Wellness Visit. I appreciate your ongoing commitment to your health goals. Please review the following plan we discussed and let me know if I can assist you in the future.   Screening recommendations/referrals: Colonoscopy aged out Mammogram aged out Bone Density scheduled Recommended yearly ophthalmology/optometry visit for glaucoma screening and checkup Recommended yearly dental visit for hygiene and checkup  Vaccinations: Influenza vaccine up to date Pneumococcal vaccine please let us know if she has had her pneumonia vaccines Tdap vaccine please let us know if she has had her tdap vaccine Shingles vaccine please let us know if she has had shingles vaccine    Advanced directives: on file  Conditions/risks identified: underweight, advanced age, worsening memory loss  Next appointment: yearly    Preventive Care 42 Years and Older, Female Preventive care refers to lifestyle choices and visits with your health care provider that can promote health and wellness. What does preventive care include? A yearly physical exam. This is also called an annual well check. Dental exams once or twice a year. Routine eye exams. Ask your health care provider how often you should have your eyes checked. Personal lifestyle choices, including: Daily care of your teeth and gums. Regular physical activity. Eating a healthy diet. Avoiding tobacco and drug use. Limiting alcohol use. Practicing safe sex. Taking low-dose aspirin every day. Taking vitamin and mineral supplements as recommended by your health care provider. What happens during an annual well check? The services and screenings done by your health care provider during your annual well check will depend on your age, overall health, lifestyle risk factors, and family history of disease. Counseling  Your health care provider may ask you questions about your: Alcohol  use. Tobacco use. Drug use. Emotional well-being. Home and relationship well-being. Sexual activity. Eating habits. History of falls. Memory and ability to understand (cognition). Work and work Statistician. Reproductive health. Screening  You may have the following tests or measurements: Height, weight, and BMI. Blood pressure. Lipid and cholesterol levels. These may be checked every 5 years, or more frequently if you are over 74 years old. Skin check. Lung cancer screening. You may have this screening every year starting at age 3 if you have a 30-pack-year history of smoking and currently smoke or have quit within the past 15 years. Fecal occult blood test (FOBT) of the stool. You may have this test every year starting at age 34. Flexible sigmoidoscopy or colonoscopy. You may have a sigmoidoscopy every 5 years or a colonoscopy every 10 years starting at age 22. Hepatitis C blood test. Hepatitis B blood test. Sexually transmitted disease (STD) testing. Diabetes screening. This is done by checking your blood sugar (glucose) after you have not eaten for a while (fasting). You may have this done every 1-3 years. Bone density scan. This is done to screen for osteoporosis. You may have this done starting at age 8. Mammogram. This may be done every 1-2 years. Talk to your health care provider about how often you should have regular mammograms. Talk with your health care provider about your test results, treatment options, and if necessary, the need for more tests. Vaccines  Your health care provider may recommend certain vaccines, such as: Influenza vaccine. This is recommended every year. Tetanus, diphtheria, and acellular pertussis (Tdap, Td) vaccine. You may need a Td booster every 10 years. Zoster vaccine. You may need this after age 96. Pneumococcal 13-valent conjugate (PCV13) vaccine. One  dose is recommended after age 31. Pneumococcal polysaccharide (PPSV23) vaccine. One dose is  recommended after age 61. Talk to your health care provider about which screenings and vaccines you need and how often you need them. This information is not intended to replace advice given to you by your health care provider. Make sure you discuss any questions you have with your health care provider. Document Released: 09/18/2015 Document Revised: 05/11/2016 Document Reviewed: 06/23/2015 Elsevier Interactive Patient Education  2017 [place] Prevention in the Home Falls can cause injuries. They can happen to people of all ages. There are many things you can do to make your home safe and to help prevent falls. What can I do on the outside of my home? Regularly fix the edges of walkways and driveways and fix any cracks. Remove anything that might make you trip as you walk through a door, such as a raised step or threshold. Trim any bushes or trees on the path to your home. Use bright outdoor lighting. Clear any walking paths of anything that might make someone trip, such as rocks or tools. Regularly check to see if handrails are loose or broken. Make sure that both sides of any steps have handrails. Any raised decks and porches should have guardrails on the edges. Have any leaves, snow, or ice cleared regularly. Use sand or salt on walking paths during winter. Clean up any spills in your garage right away. This includes oil or grease spills. What can I do in the bathroom? Use night lights. Install grab bars by the toilet and in the tub and shower. Do not use towel bars as grab bars. Use non-skid mats or decals in the tub or shower. If you need to sit down in the shower, use a plastic, non-slip stool. Keep the floor dry. Clean up any water that spills on the floor as soon as it happens. Remove soap buildup in the tub or shower regularly. Attach bath mats securely with double-sided non-slip rug tape. Do not have throw rugs and other things on the floor that can make you  trip. What can I do in the bedroom? Use night lights. Make sure that you have a light by your bed that is easy to reach. Do not use any sheets or blankets that are too big for your bed. They should not hang down onto the floor. Have a firm chair that has side arms. You can use this for support while you get dressed. Do not have throw rugs and other things on the floor that can make you trip. What can I do in the kitchen? Clean up any spills right away. Avoid walking on wet floors. Keep items that you use a lot in easy-to-reach places. If you need to reach something above you, use a strong step stool that has a grab bar. Keep electrical cords out of the way. Do not use floor polish or wax that makes floors slippery. If you must use wax, use non-skid floor wax. Do not have throw rugs and other things on the floor that can make you trip. What can I do with my stairs? Do not leave any items on the stairs. Make sure that there are handrails on both sides of the stairs and use them. Fix handrails that are broken or loose. Make sure that handrails are as long as the stairways. Check any carpeting to make sure that it is firmly attached to the stairs. Fix any carpet that is loose or worn.  Avoid having throw rugs at the top or bottom of the stairs. If you do have throw rugs, attach them to the floor with carpet tape. Make sure that you have a light switch at the top of the stairs and the bottom of the stairs. If you do not have them, ask someone to add them for you. What else can I do to help prevent falls? Wear shoes that: Do not have high heels. Have rubber bottoms. Are comfortable and fit you well. Are closed at the toe. Do not wear sandals. If you use a stepladder: Make sure that it is fully opened. Do not climb a closed stepladder. Make sure that both sides of the stepladder are locked into place. Ask someone to hold it for you, if possible. Clearly mark and make sure that you can  see: Any grab bars or handrails. First and last steps. Where the edge of each step is. Use tools that help you move around (mobility aids) if they are needed. These include: Canes. Walkers. Scooters. Crutches. Turn on the lights when you go into a dark area. Replace any light bulbs as soon as they burn out. Set up your furniture so you have a clear path. Avoid moving your furniture around. If any of your floors are uneven, fix them. If there are any pets around you, be aware of where they are. Review your medicines with your doctor. Some medicines can make you feel dizzy. This can increase your chance of falling. Ask your doctor what other things that you can do to help prevent falls. This information is not intended to replace advice given to you by your health care provider. Make sure you discuss any questions you have with your health care provider. Document Released: 06/18/2009 Document Revised: 01/28/2016 Document Reviewed: 09/26/2014 Elsevier Interactive Patient Education  2017 [employer].

## 2021-06-24 NOTE — Progress Notes (Signed)
Subjective:   Renata Gambino is a 87 y.o. female who presents for Medicare Annual (Subsequent) preventive examination.  Review of Systems           Objective:    There were no vitals filed for this visit. There is no height or weight on file to calculate BMI.  Advanced Directives 06/24/2021 03/19/2021 03/16/2021 01/25/2021 01/06/2021 12/04/2020 11/25/2020  Does Patient Have a Medical Advance Directive? Yes Yes Yes Yes Yes Yes Yes  Type of Arts administrator Power of [place] of [place]  Does patient want to make changes to medical advance directive? No - Patient declined No - Patient declined No - Patient declined No - Patient declined No - Patient declined No - Patient declined No - Patient declined  Copy of [place] in Chart? - Yes - validated most recent copy scanned in chart (See row information) Yes - validated most recent copy scanned in chart (See row information) Yes - validated most recent copy scanned in chart (See row information) Yes - validated most recent copy scanned in chart (See row information) Yes - validated most recent copy scanned in chart (See row information) Yes - validated most recent copy scanned in chart (See row information)    Current Medications (verified) Outpatient Encounter Medications as of 06/24/2021  Medication Sig   acetaminophen (TYLENOL) 500 MG tablet Take 1 tablet (500 mg total) by mouth every 8 (eight) hours as needed for moderate pain.   b complex vitamins capsule Take 1 capsule by mouth daily.   Carboxymeth-Glyc-Polysorb PF (REFRESH OPTIVE ADVANCED PF) 0.5-1-0.5 % SOLN Apply 1 drop to eye in the morning and at bedtime.   dorzolamidel-timolol (COSOPT) 22.3-6.8 MG/ML SOLN ophthalmic solution Place 1 drop into both eyes 2 (two) times daily.   latanoprost  (XALATAN) 0.005 % ophthalmic solution Place 1 drop into both eyes at bedtime.   memantine (NAMENDA) 10 MG tablet TAKE 1 TABLET BY MOUTH TWICE A DAY   metoprolol succinate (TOPROL-XL) 100 MG 24 hr tablet TAKE 1 TABLET BY MOUTH DAILY. TAKE WITH OR IMMEDIATELY FOLLOWING A MEAL.   mineral oil-hydrophilic petrolatum (AQUAPHOR) ointment Apply topically as needed for dry skin. Both legs   VITAMIN D, CHOLECALCIFEROL, PO Take 2,000 Units by mouth daily. Liquid   amLODipine (NORVASC) 2.5 MG tablet TAKE 2 TABLETS BY MOUTH DAILY (Patient not taking: Reported on 06/24/2021)   triamcinolone cream (KENALOG) 0.1 % Apply 1 application topically 2 (two) times daily.   No facility-administered encounter medications on file as of 06/24/2021.    Allergies (verified) Nsaids, Ampicillin, Arimidex [anastrozole], Azithromycin, Celecoxib, Fosamax [alendronate], Ibuprofen, Naproxen, Penicillins, Sulfa antibiotics, Tetracyclines & related, and Losartan   History: Past Medical History:  Diagnosis Date   Abnormal thyroid blood test 11/04/2010   Dr.Berglind's lab results indicated TSH was low, Per records provided by patient at new patient appointment    Alzheimer disease [facility])    Per [facility] New Patient packet   Angle-closure glaucoma    Per records provided by patient at new patient appointment    Arthritis    Per [facility] New Patient packet   Breast cancer ([place])    Chemo 04/2001-07/2001, Per [facility] New Patient packet   Chronic kidney disease    Per [facility] New Patient packet   Chronic UTI    Per [facility] New Patient packet   Colon polyps  Per [facility] New Patient packet   Dementia [facility])    Per [facility] New Patient packet   Eczema    Per [facility] New Patient packet   Glaucoma    Per [facility] New Patient packet   H/O angiography 02/2019   Per [place] Patient Packet   H/O CT scan of head 01/2020   Per [place] New Patient Packet   H/O cystoscopy 03/12/2014   Dr.Kelly Venia Minks, Per [facility] New Patient packet   H/O laser iridotomy 06/2015   Per [facility] New  Patient Packet   Hematuria    Per [facility] New Patient packet   Henoch-Schonlein purpura [facility])    Dr.Heher, Per [facility] New Patient packet   Hives    Per records provided by patient at new patient appointment    Hypertension    Per [facility] New Patient packet   Low TSH level    Memory loss    Over the past 6 years (as of 2021), Per [facility] New Patient packet   Osteoporosis    Per [facility] New Patient packet   Retinal vein occlusion of left eye    Per [facility] New Patient packet   Thyroid disease    Per [facility] New Patient packet   Vitamin D deficiency    Per [facility] New Patient packet   Past Surgical History:  Procedure Laterality Date   ABDOMINAL HYSTERECTOMY     Fibroid tumor. Patient with ovaries. Per [facility] New Patient packet   BIOPSY THYROID     Dr.Rahmani, Per [facility] New Patient packet   BREAST BIOPSY  02/1987   Per [facility] New Patient Packet   BREAST BIOPSY  01/2001   Per [place] New Patient packet   COLONOSCOPY  05/17/2017   Per [facility] New Patient packet, performed by Dr. Steffanie Dunn at [facility]. Polyp was removed.   MASTECTOMY  02/2001   Per [facility] New Patient packet   MASTECTOMY MODIFIED RADICAL  03/1987   Per [facility] New Patient packet   RENAL BIOPSY  2003   Done in [place]. Per records provided by patient at new patient appointment    [place]   Per [place] Patient packet   Family History  Problem Relation Age of Onset   Cancer Mother    Heart disease Father    Hypertension Son    Hypertension Daughter    Social History   Socioeconomic History   Marital status: Married    Spouse name: Not on file   Number of children: Not on file   Years of education: Not on file   Highest education level: Not on file  Occupational History   Not on file  Tobacco Use   Smoking status: Never   Smokeless tobacco: Never  Vaping Use   Vaping Use: Never used  Substance and Sexual Activity   Alcohol use: Never   Drug use: Never   Sexual activity: Not on file  Other Topics Concern   Not  on file  Social History Narrative   Per [place] Patient Packet Abstracted on 06/19/2020:      Diet:Regular diet, currently under weight      Caffeine: No      Married, if yes what year: Married, year left blank      Do you live in a house, apartment, assisted living, condo, trailer, ect: [place]      Is it one or more stories: One stories      Pets: No  Current/Past profession: Network engineer       Highest level or education completed: [employer]      Exercise:     Yes             Type and how often: Walking daily          Living Will: Yes   DNR: left blank   POA/HPOA: Yes      Functional Status:   Do you have difficulty bathing or dressing yourself? No   Do you have difficulty preparing food or eating? Yes   Do you have difficulty managing your medications? Yes   Do you have difficulty managing your finances? Yes   Do you have difficulty affording your medications? No   Social Determinants of Radio broadcast assistant Strain: Not on file  Food Insecurity: Not on file  Transportation Needs: Not on file  Physical Activity: Not on file  Stress: Not on file  Social Connections: Not on file    Tobacco Counseling Counseling given: Not Answered   Clinical Intake:                 Diabetic?no         Activities of Daily Living No flowsheet data found.  Patient Care Team: Lauree Chandler, NP as PCP - General (Geriatric Medicine)  Indicate any recent Medical Services you may have received from other than [facility] providers in the past year (date may be approximate).     Assessment:   This is a routine wellness examination for Aayushi.  Hearing/Vision screen Hearing Screening - Comments:: Patient has no hearing problems Vision Screening - Comments:: Patient has glaucoma and cataracts. Patient had eye exam last week. Patient sees Dr. Manuella Ghazi  Dietary issues and exercise activities discussed:     Goals Addressed   None    Depression  Screen PHQ 2/9 Scores 06/24/2021  PHQ - 2 Score 0    Fall Risk Fall Risk  06/24/2021 03/19/2021 03/16/2021 01/25/2021 12/04/2020  Falls in the past year? 0 0 0 0 0  Number falls in past yr: 0 0 0 0 0  Injury with Fall? 0 0 0 0 0  Risk for fall due to : No Fall Risks;History of fall(s) No Fall Risks No Fall Risks - -  Follow up - Falls evaluation completed - - -    FALL RISK PREVENTION PERTAINING TO THE HOME:  Any stairs in or around the home? No  If so, are there any without handrails? No  Home free of loose throw rugs in walkways, pet beds, electrical cords, etc? Yes  Adequate lighting in your home to reduce risk of falls? Yes   ASSISTIVE DEVICES UTILIZED TO PREVENT FALLS:  Life alert? No  Use of a cane, walker or w/c? No  Grab bars in the bathroom? Yes  Shower chair or bench in shower? No  Elevated toilet seat or a handicapped toilet? Yes   TIMED UP AND GO:  Was the test performed? No .    Cognitive Function:     6CIT Screen 06/24/2021  What Year? 4 points  What month? 3 points  What time? 3 points  Count back from 20 4 points  Months in reverse 4 points  Repeat phrase 10 points  Total Score 28    Immunizations Immunization History  Administered Date(s) Administered   Fluad Quad(high Dose 65+) 06/19/2020   PFIZER(Purple Top)SARS-COV-2 Vaccination 11/05/2019, 11/28/2019, 07/15/2020   Pneumococcal-Unspecified 02/15/2011    TDAP status: Due,  Education has been provided regarding the importance of this vaccine. Advised may receive this vaccine at local pharmacy or Health Dept. Aware to provide a copy of the vaccination record if obtained from local pharmacy or Health Dept. Verbalized acceptance and understanding.  Flu Vaccine status: Up to date  Pneumococcal vaccine status: Due, Education has been provided regarding the importance of this vaccine. Advised may receive this vaccine at local pharmacy or Health Dept. Aware to provide a copy of the vaccination record if  obtained from local pharmacy or Health Dept. Verbalized acceptance and understanding.  Covid-19 vaccine status: Completed vaccines  Qualifies for Shingles Vaccine? Yes   Zostavax completed No   Shingrix Completed?: No.    Education has been provided regarding the importance of this vaccine. Patient has been advised to call insurance company to determine out of pocket expense if they have not yet received this vaccine. Advised may also receive vaccine at local pharmacy or Health Dept. Verbalized acceptance and understanding.  Screening Tests Health Maintenance  Topic Date Due   TETANUS/TDAP  Never done   Zoster Vaccines- Shingrix (1 of 2) Never done   Pneumonia Vaccine 70+ Years old (1 - PCV) 05/13/2001   DEXA SCAN  Never done   COVID-19 Vaccine (4 - Booster for Pfizer series) 09/09/2020   INFLUENZA VACCINE  04/05/2021   HPV VACCINES  Aged Out    Health Maintenance  Health Maintenance Due  Topic Date Due   TETANUS/TDAP  Never done   Zoster Vaccines- Shingrix (1 of 2) Never done   Pneumonia Vaccine 84+ Years old (1 - PCV) 05/13/2001   DEXA SCAN  Never done   COVID-19 Vaccine (4 - Booster for Pfizer series) 09/09/2020   INFLUENZA VACCINE  04/05/2021    Colorectal cancer screening: No longer required.   Mammogram status: No longer required due to  .  Bone Density status: Ordered and scheduled. Pt provided with contact info and advised to call to schedule appt.  Lung Cancer Screening: (Low Dose CT Chest recommended if Age 32-80 years, 30 pack-year currently smoking OR have quit w/in 15years.) does not qualify.   Lung Cancer Screening Referral: na  Additional Screening:  Hepatitis C Screening: does not qualify; Completed na  Vision Screening: Recommended annual ophthalmology exams for early detection of glaucoma and other disorders of the eye. Is the patient up to date with their annual eye exam?  No  Who is the provider or what is the name of the office in which the patient  attends annual eye exams? [facility] If pt is not established with a provider, would they like to be referred to a provider to establish care? No .   Dental Screening: Recommended annual dental exams for proper oral hygiene  Community Resource Referral / Chronic Care Management: CRR required this visit?  No   CCM required this visit?  No      Plan:     I have personally reviewed and noted the following in the patient's chart:   Medical and social history Use of alcohol, tobacco or illicit drugs  Current medications and supplements including opioid prescriptions.  Functional ability and status Nutritional status Physical activity Advanced directives List of other physicians Hospitalizations, surgeries, and ER visits in previous 12 months Vitals Screenings to include cognitive, depression, and falls Referrals and appointments  In addition, I have reviewed and discussed with patient certain preventive protocols, quality metrics, and best practice recommendations. A written personalized care plan for preventive services as well  as general preventive health recommendations were provided to patient.     Lauree Chandler, NP   06/24/2021     Virtual Visit via Telephone Note  I connected withNAME@ on 06/24/21 at 10:00 AM EDT by telephone and verified that I am speaking with the correct person using two identifiers.  Location: Patient: home Provider: [place]    I discussed the limitations, risks, security and privacy concerns of performing an evaluation and management service by telephone and the availability of in person appointments. I also discussed with the patient that there may be a patient responsible charge related to this service. The patient expressed understanding and agreed to proceed.   I discussed the assessment and treatment plan with the patient. The patient was provided an opportunity to ask questions and all were answered. The patient agreed with the plan and  demonstrated an understanding of the instructions.   The patient was advised to call back or seek an in-person evaluation if the symptoms worsen or if the condition fails to improve as anticipated.  I provided 12 minutes of non-face-to-face time during this encounter.  Carlos American. Harle Battiest Avs printed and mailed

## 2021-06-24 NOTE — Progress Notes (Signed)
This service is provided via telemedicine  No vital signs collected/recorded due to the encounter was a telemedicine visit.   Location of patient (ex: home, work):  Home  Patient consents to a telephone visit:  Yes, see encounter dated 06/24/2021  Location of the provider (ex: office, home):  [place]  Name of any referring provider:  N/A  Names of all persons participating in the telemedicine service and their role in the encounter:  Sherrie Mustache, Nurse Practitioner, Carroll Kinds, CMA, patient and daughter, [name]  Time spent on call:  11 minutes with medical assistant

## 2021-06-26 ENCOUNTER — Other Ambulatory Visit: Payer: Self-pay | Admitting: Nurse Practitioner

## 2021-06-26 DIAGNOSIS — I10 Essential (primary) hypertension: Secondary | ICD-10-CM

## 2021-07-16 ENCOUNTER — Other Ambulatory Visit: Payer: Medicare Other

## 2021-09-08 ENCOUNTER — Encounter: Payer: Self-pay | Admitting: Nurse Practitioner

## 2021-09-09 ENCOUNTER — Telehealth: Payer: Self-pay | Admitting: Nurse Practitioner

## 2021-09-09 DIAGNOSIS — G309 Alzheimer's disease, unspecified: Secondary | ICD-10-CM

## 2021-09-09 DIAGNOSIS — R636 Underweight: Secondary | ICD-10-CM

## 2021-09-09 DIAGNOSIS — F0280 Dementia in other diseases classified elsewhere without behavioral disturbance: Secondary | ICD-10-CM

## 2021-09-09 NOTE — Telephone Encounter (Signed)
Pt daughter([name]) called [facility] wanting them out for evaluation ASAP & ready since pt has declined.  Can we get updated referral generated to match. Hospice is scheduled to go out to home 09/11/21.  Thanks, Vilinda Blanks

## 2021-09-09 NOTE — Telephone Encounter (Signed)
Order done

## 2021-09-24 ENCOUNTER — Ambulatory Visit (INDEPENDENT_AMBULATORY_CARE_PROVIDER_SITE_OTHER): Payer: Medicare Other | Admitting: Nurse Practitioner

## 2021-09-24 ENCOUNTER — Other Ambulatory Visit: Payer: Self-pay

## 2021-09-24 ENCOUNTER — Encounter: Payer: Self-pay | Admitting: Nurse Practitioner

## 2021-09-24 VITALS — BP 140/82 | HR 63 | Temp 97.8°F | Ht 60.0 in | Wt 90.0 lb

## 2021-09-24 DIAGNOSIS — R636 Underweight: Secondary | ICD-10-CM

## 2021-09-24 DIAGNOSIS — Z7189 Other specified counseling: Secondary | ICD-10-CM

## 2021-09-24 DIAGNOSIS — G309 Alzheimer's disease, unspecified: Secondary | ICD-10-CM

## 2021-09-24 DIAGNOSIS — M5136 Other intervertebral disc degeneration, lumbar region: Secondary | ICD-10-CM

## 2021-09-24 DIAGNOSIS — F0280 Dementia in other diseases classified elsewhere without behavioral disturbance: Secondary | ICD-10-CM

## 2021-09-24 DIAGNOSIS — E559 Vitamin D deficiency, unspecified: Secondary | ICD-10-CM | POA: Diagnosis not present

## 2021-09-24 DIAGNOSIS — I10 Essential (primary) hypertension: Secondary | ICD-10-CM | POA: Diagnosis not present

## 2021-09-24 MED ORDER — METOPROLOL SUCCINATE ER 100 MG PO TB24: 100.0000 mg | ORAL_TABLET | Freq: Every day | ORAL | 3 refills | Status: AC

## 2021-09-24 NOTE — Progress Notes (Signed)
Careteam: Patient Care Team: Lauree Chandler, NP as PCP - General (Geriatric Medicine)  PLACE OF SERVICE:  [place] Directive information Does Patient Have a Medical Advance Directive?: Yes, Type of Advance Directive: [place], Does patient want to make changes to medical advance directive?: No - Patient declined  Allergies  Allergen Reactions   Nsaids    Ampicillin    Arimidex [Anastrozole] Other (See Comments)    Kidney problems    Azithromycin    Celecoxib    Fosamax [Alendronate] Other (See Comments)    Kidney problem    Ibuprofen    Naproxen    Penicillins    Sulfa Antibiotics    Tetracyclines & Related    Losartan Rash    Chief Complaint  Patient presents with   Medical Management of Chronic Issues    6 month follow-up. Discuss need for td/tdap, shingrix, PCV, and covid booster or post pone if patient refuses.      HPI: Patient is a 88 y.o. female for routine follow up. She is a resident of [facility], independent living. Lots of supervision by daughter.  At the beginning of the month she was found unresponsive and not able to arouse per daughter- 10-15 mins later they were able to get her awake and she sat up and was able to eat and drink water.   Daughter wants to get protein and albumin today.  She is now eating and drinking normally.  Continues to try to get her to drink water.  Smaller portions more frequently.  Boost shakes.   Does not want to get bone density at this time.   Otherwise no new complaints or concerns.   No complaints of pain.   Continues to go to the bathroom, bath and changes clothes independently.   Tested positive for COVID in December, symptoms were fatigue.   Review of Systems:  Review of Systems  Unable to perform ROS: Dementia   Past Medical History:  Diagnosis Date   Abnormal thyroid blood test 11/04/2010   Dr.Berglind's lab results indicated TSH was low, Per records provided by  patient at new patient appointment    Alzheimer disease [facility])    Per [facility] New Patient packet   Angle-closure glaucoma    Per records provided by patient at new patient appointment    Arthritis    Per [facility] New Patient packet   Breast cancer ([place])    Chemo 04/2001-07/2001, Per [facility] New Patient packet   Chronic kidney disease    Per [facility] New Patient packet   Chronic UTI    Per [facility] New Patient packet   Colon polyps    Per [place] Patient packet   Dementia [facility])    Per [facility] New Patient packet   Eczema    Per [facility] New Patient packet   Glaucoma    Per [place] Patient packet   H/O angiography 02/2019   Per [place] Patient Packet   H/O CT scan of head 01/2020   Per [place] New Patient Packet   H/O cystoscopy 03/12/2014   Dr.Kelly Venia Minks, Per [facility] New Patient packet   H/O laser iridotomy 06/2015   Per [facility] New Patient Packet   Hematuria    Per [facility] New Patient packet   Henoch-Schonlein purpura [facility])    Dr.Heher, Per [facility] New Patient packet   Hives    Per records provided by patient at new patient appointment    Hypertension  Per [facility] New Patient packet   Low TSH level    Memory loss    Over the past 6 years (as of 2021), Per [facility] New Patient packet   Osteoporosis    Per [facility] New Patient packet   Retinal vein occlusion of left eye    Per [facility] New Patient packet   Thyroid disease    Per [facility] New Patient packet   Vitamin D deficiency    Per [facility] New Patient packet   Past Surgical History:  Procedure Laterality Date   ABDOMINAL HYSTERECTOMY     Fibroid tumor. Patient with ovaries. Per [facility] New Patient packet   BIOPSY THYROID     Dr.Rahmani, Per [facility] New Patient packet   BREAST BIOPSY  02/1987   Per [facility] New Patient Packet   BREAST BIOPSY  01/2001   Per [place] New Patient packet   COLONOSCOPY  05/17/2017   Per [facility] New Patient packet, performed by Dr[facility] Steffanie Dunn at [facility]. Polyp was removed.   MASTECTOMY  02/2001   Per [facility] New Patient packet   MASTECTOMY MODIFIED RADICAL   03/1987   Per [facility] New Patient packet   RENAL BIOPSY  2003   Done in [place]. Per records provided by patient at new patient appointment    TONSILLECTOMY  1944   Per [facility] New Patient packet   Social History:   reports that she has never smoked. She has never used smokeless tobacco. She reports that she does not drink alcohol and does not use drugs.  Family History  Problem Relation Age of Onset   Cancer Mother    Heart disease Father    Hypertension Son    Hypertension Daughter     Medications: Patient's Medications  New Prescriptions   No medications on file  Previous Medications   ACETAMINOPHEN (TYLENOL) 500 MG TABLET    Take 1 tablet (500 mg total) by mouth every 8 (eight) hours as needed for moderate pain.   B COMPLEX VITAMINS CAPSULE    Take 1 capsule by mouth daily.   CARBOXYMETH-GLYC-POLYSORB PF (REFRESH OPTIVE ADVANCED PF) 0.5-1-0.5 % SOLN    Apply 1 drop to eye in the morning and at bedtime.   DORZOLAMIDEL-TIMOLOL (COSOPT) 22.3-6.8 MG/ML SOLN OPHTHALMIC SOLUTION    Place 1 drop into both eyes 2 (two) times daily.   LATANOPROST (XALATAN) 0.005 % OPHTHALMIC SOLUTION    Place 1 drop into both eyes at bedtime.   MEMANTINE (NAMENDA) 10 MG TABLET    TAKE 1 TABLET BY MOUTH TWICE A DAY   METOPROLOL SUCCINATE (TOPROL-XL) 100 MG 24 HR TABLET    TAKE 1 TABLET BY MOUTH DAILY. TAKE WITH OR IMMEDIATELY FOLLOWING A MEAL.   MINERAL OIL-HYDROPHILIC PETROLATUM (AQUAPHOR) OINTMENT    Apply topically as needed for dry skin. Both legs   TRIAMCINOLONE CREAM (KENALOG) 0.1 %    Apply 1 application topically 2 (two) times daily.   VITAMIN D, CHOLECALCIFEROL, PO    Take 2,000 Units by mouth daily. Liquid  Modified Medications   No medications on file  Discontinued Medications   AMLODIPINE (NORVASC) 2.5 MG TABLET    TAKE 2 TABLETS BY MOUTH DAILY    Physical Exam:  Vitals:   09/24/21 1118  BP: 140/82  Pulse: 63  Temp: 97.8 F (36.6 C)  TempSrc: Temporal  SpO2: 99%  Weight: 90 lb (40.8  kg)  Height: 5' (1.524 m)  HC: 4" (10.2 cm)   Body mass index is 17.58 kg/m.  Wt Readings from Last 3 Encounters:  09/24/21 90 lb (40.8 kg)  03/19/21 91 lb 9.6 oz (41.5 kg)  03/16/21 91 lb 6.4 oz (41.5 kg)    Physical Exam Constitutional:      General: She is not in acute distress.    Appearance: She is well-developed. She is not diaphoretic.     Comments: Thin female, well dressed, pleasant   HENT:     Head: Normocephalic and atraumatic.     Mouth/Throat:     Pharynx: No oropharyngeal exudate.  Eyes:     Conjunctiva/sclera: Conjunctivae normal.     Pupils: Pupils are equal, round, and reactive to light.  Cardiovascular:     Rate and Rhythm: Normal rate and regular rhythm.     Heart sounds: Normal heart sounds.  Pulmonary:     Effort: Pulmonary effort is normal.     Breath sounds: Normal breath sounds.  Abdominal:     General: Bowel sounds are normal.     Palpations: Abdomen is soft.  Musculoskeletal:     Cervical back: Normal range of motion and neck supple.     Right lower leg: No edema.     Left lower leg: No edema.  Skin:    General: Skin is warm and dry.  Neurological:     Mental Status: She is alert. Mental status is at baseline.  Psychiatric:        Mood and Affect: Mood normal.        Cognition and Memory: Cognition is impaired. Memory is impaired. She exhibits impaired recent memory and impaired remote memory.    Labs reviewed: Basic Metabolic Panel: Recent Labs    01/25/21 1157 03/19/21 1524  NA 141 142  K 3.9 3.7  CL 102 104  CO2 27 29  GLUCOSE 92 95  BUN 24 27*  CREATININE 0.90* 0.91  CALCIUM 9.5 9.8   Liver Function Tests: Recent Labs    01/25/21 1157  AST 14  ALT 12  BILITOT 0.8  PROT 6.9   No results for input(s): LIPASE, AMYLASE in the last 8760 hours. No results for input(s): AMMONIA in the last 8760 hours. CBC: Recent Labs    01/25/21 1157 02/02/21 1039 03/19/21 1524  WBC 11.8* 9.0 9.5  NEUTROABS 8,933* 6,381 6,232  HGB  14.7 14.3 14.7  HCT 44.4 43.7 43.2  MCV 95.7 97.1 92.7  PLT 262 294 280   Lipid Panel: No results for input(s): CHOL, HDL, LDLCALC, TRIG, CHOLHDL, LDLDIRECT in the last 8760 hours. TSH: No results for input(s): TSH in the last 8760 hours. A1C: No results found for: HGBA1C   Assessment/Plan 1. Hypertension, unspecified type -Blood pressure well controlled Continue current medications Recheck metabolic panel - metoprolol succinate (TOPROL-XL) 100 MG 24 hr tablet; Take 1 tablet (100 mg total) by mouth daily. TAKE WITH OR IMMEDIATELY FOLLOWING A MEAL.  Dispense: 90 tablet; Refill: 3 - CMP with eGFR([facility]) - CBC with Differential/Platelet  2. Vitamin D deficiency Continues on supplement, 2000 units daily - Vitamin D, 25-hydroxy  3. Advance care planning -MOST form completed with daughter. Comfort measures requested. - DNR (Do Not Resuscitate)  4. Alzheimer's disease ([place]) Advance disease, continues with supportive care with help of husband and daughter. Stable, no acute changes in cognitive or functional status.   5. Underweight Stable, continue supplements.   6. DDD (degenerative disc disease), lumbar Stable, no complaints of pain at this time.     Next appt: 6 months.  Carlos American. Harle Battiest  [facility] [phone]

## 2021-09-25 LAB — CBC WITH DIFFERENTIAL/PLATELET
Absolute Monocytes: 731 cells/uL (ref 200–950)
Basophils Absolute: 86 cells/uL (ref 0–200)
Basophils Relative: 1 %
Eosinophils Absolute: 138 cells/uL (ref 15–500)
Eosinophils Relative: 1.6 %
HCT: 43 % (ref 35.0–45.0)
Hemoglobin: 14.7 g/dL (ref 11.7–15.5)
Lymphs Abs: 2451 cells/uL (ref 850–3900)
MCH: 32.2 pg (ref 27.0–33.0)
MCHC: 34.2 g/dL (ref 32.0–36.0)
MCV: 94.3 fL (ref 80.0–100.0)
MPV: 13.2 fL — ABNORMAL HIGH (ref 7.5–12.5)
Monocytes Relative: 8.5 %
Neutro Abs: 5194 cells/uL (ref 1500–7800)
Neutrophils Relative %: 60.4 %
Platelets: 209 10*3/uL (ref 140–400)
RBC: 4.56 10*6/uL (ref 3.80–5.10)
RDW: 12.6 % (ref 11.0–15.0)
Total Lymphocyte: 28.5 %
WBC: 8.6 10*3/uL (ref 3.8–10.8)

## 2021-09-25 LAB — VITAMIN D 25 HYDROXY (VIT D DEFICIENCY, FRACTURES): Vit D, 25-Hydroxy: 56 ng/mL (ref 30–100)

## 2021-09-25 LAB — COMPLETE METABOLIC PANEL WITH GFR
AG Ratio: 1.6 (calc) (ref 1.0–2.5)
ALT: 9 U/L (ref 6–29)
AST: 13 U/L (ref 10–35)
Albumin: 4.2 g/dL (ref 3.6–5.1)
Alkaline phosphatase (APISO): 68 U/L (ref 37–153)
BUN: 21 mg/dL (ref 7–25)
CO2: 31 mmol/L (ref 20–32)
Calcium: 9.7 mg/dL (ref 8.6–10.4)
Chloride: 107 mmol/L (ref 98–110)
Creat: 0.9 mg/dL (ref 0.60–0.95)
Globulin: 2.7 g/dL (calc) (ref 1.9–3.7)
Glucose, Bld: 78 mg/dL (ref 65–99)
Potassium: 3.5 mmol/L (ref 3.5–5.3)
Sodium: 144 mmol/L (ref 135–146)
Total Bilirubin: 0.8 mg/dL (ref 0.2–1.2)
Total Protein: 6.9 g/dL (ref 6.1–8.1)
eGFR: 63 mL/min/{1.73_m2} (ref >=60)

## 2021-10-26 ENCOUNTER — Other Ambulatory Visit: Payer: Self-pay | Admitting: Nurse Practitioner

## 2021-10-26 DIAGNOSIS — F0280 Dementia in other diseases classified elsewhere without behavioral disturbance: Secondary | ICD-10-CM

## 2021-10-27 ENCOUNTER — Other Ambulatory Visit: Payer: Self-pay | Admitting: Nurse Practitioner

## 2021-10-27 DIAGNOSIS — F0280 Dementia in other diseases classified elsewhere without behavioral disturbance: Secondary | ICD-10-CM

## 2021-11-02 IMAGING — CR DG LUMBAR SPINE COMPLETE 4+V
5 series · 5 of 5 positions shown · non-contrast
Comparison: None.

CLINICAL DATA: Chronic bilateral low back pain without sciatica.
Worsening low back pain back pain for 2 months

EXAM:
LUMBAR SPINE - COMPLETE 4+ VIEW

[t lumbar spine ap]
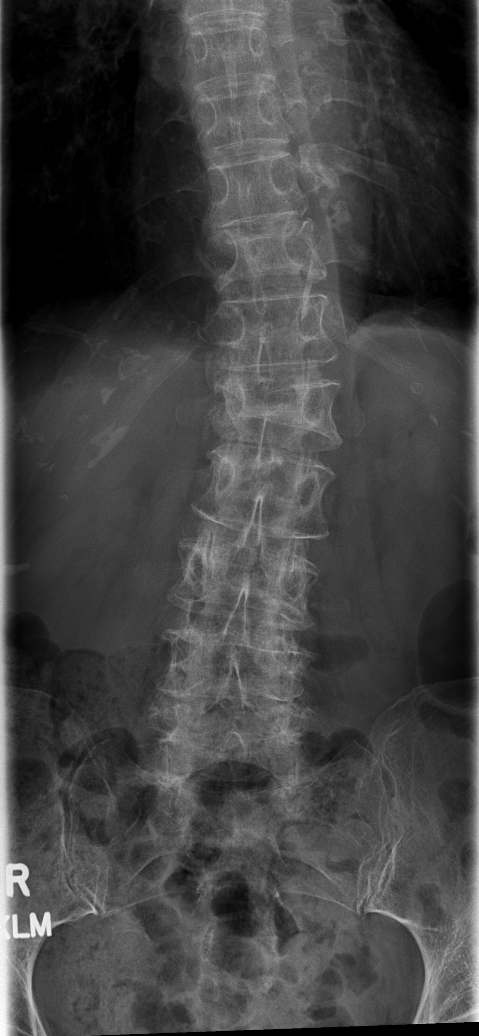

[t lumbar spine obl (1 of 2)]
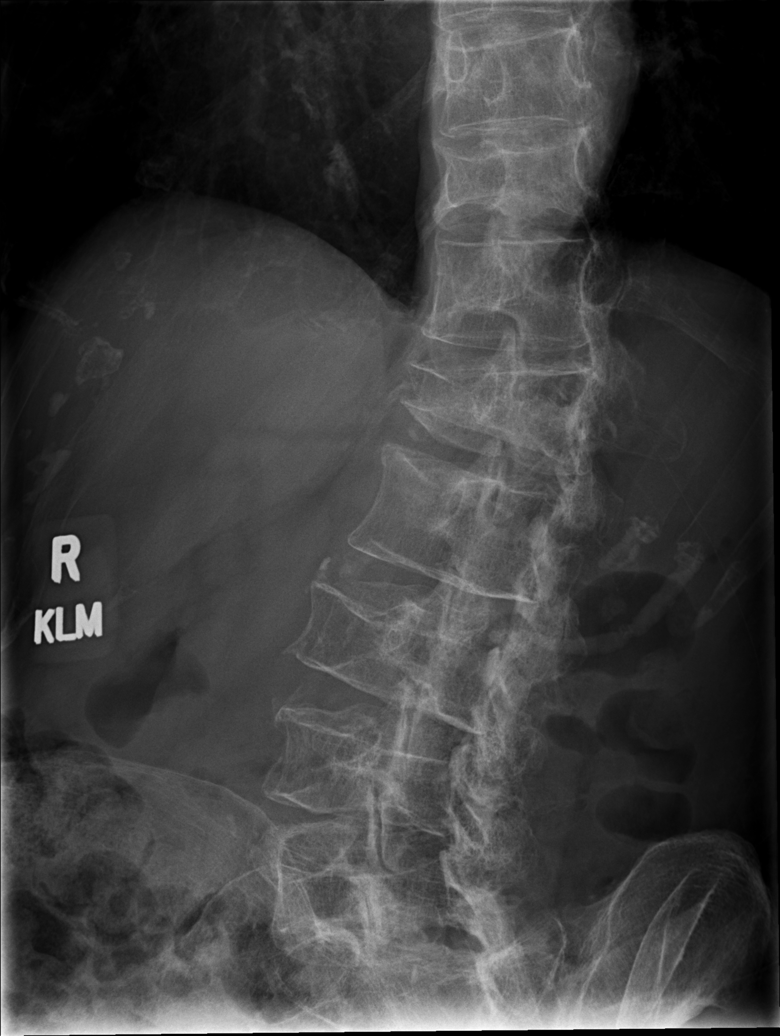

[t lumbar spine obl (2 of 2)]
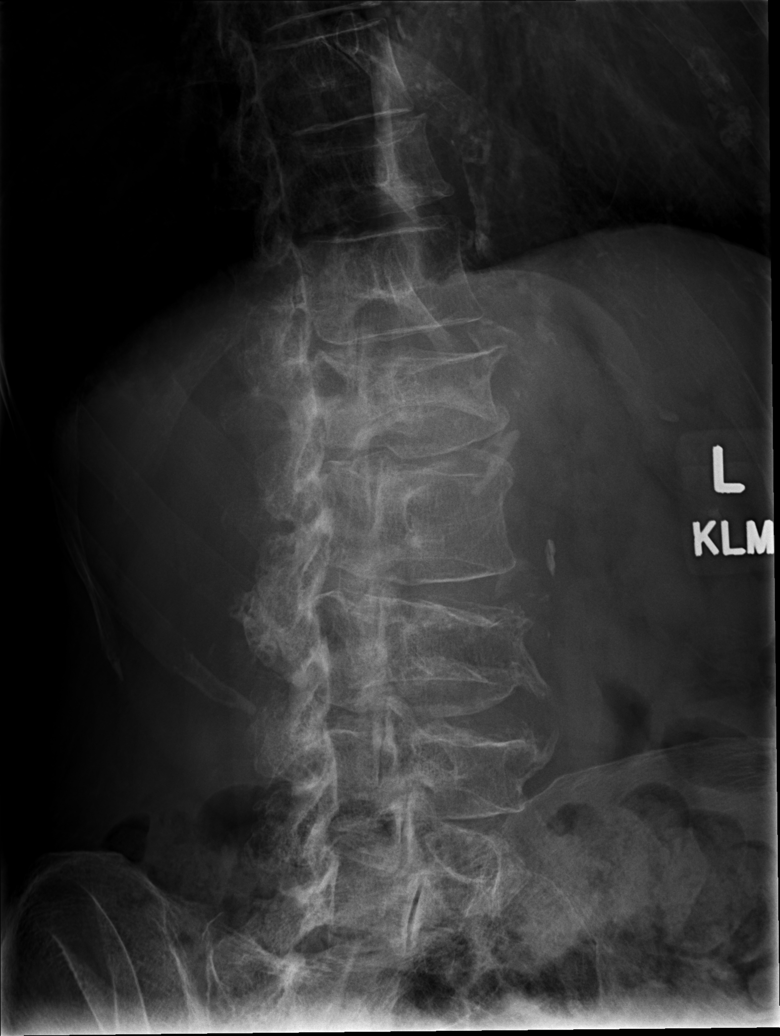

[t lumbar spine lat]
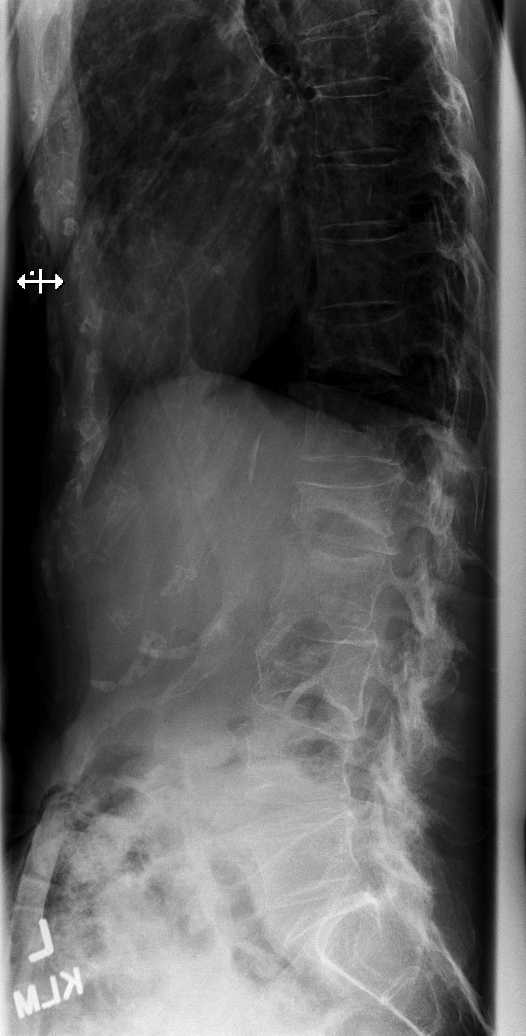

[t lumbar l-5 s-1 spot]
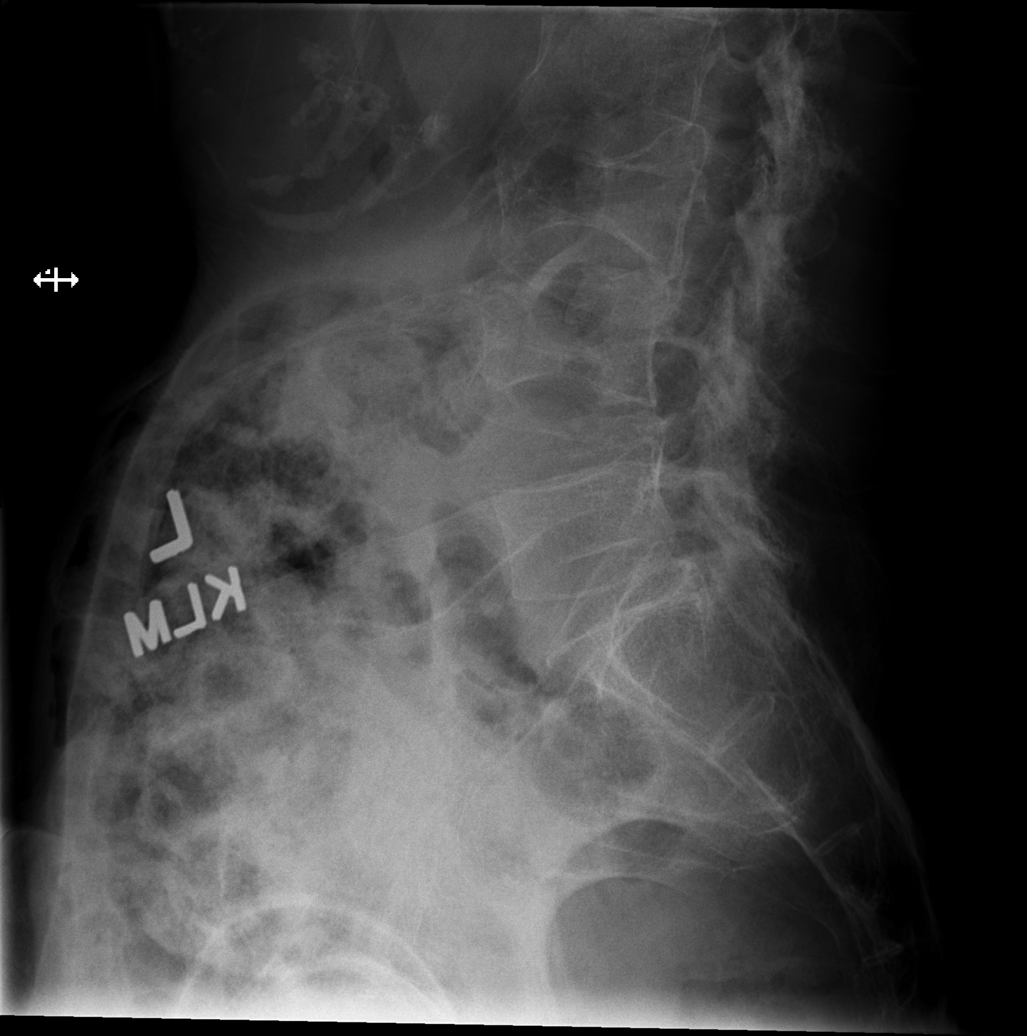

[5 of 5 positions shown; findings below may reference images not displayed]

FINDINGS: Thoracolumbar scoliosis deformity is noted with convexity towards
the left, centered around the T12-L1 level. Age-indeterminate
compression deformities are noted involving the T11 and L1 vertebra
with loss of approximately 30-40% of the vertebral body height. Mild
multilevel degenerative disc disease noted. Facet arthropathy is
noted at L4-5 and L5-S1.
IMPRESSION: 1. Age-indeterminate compression deformities involving the T11 and
L1 vertebra with loss of approximately 30-40% of the vertebral body
height.
2. Thoracolumbar scoliosis deformity.
3. Mild multilevel lumbar degenerative disc disease with facet
arthropathy at L4-5 and L5-S1.

## 2021-11-10 ENCOUNTER — Encounter: Payer: Self-pay | Admitting: Nurse Practitioner

## 2021-11-10 DIAGNOSIS — F0280 Dementia in other diseases classified elsewhere without behavioral disturbance: Secondary | ICD-10-CM

## 2021-11-10 NOTE — Telephone Encounter (Signed)
Message routed to [place], Jessica K, NP  

## 2021-11-19 ENCOUNTER — Telehealth: Payer: Self-pay

## 2021-11-19 NOTE — Telephone Encounter (Signed)
Attempted to contact patient's daughter [name] to schedule a Palliative Care consult appointment. No answer left a message to return call.  ? ?

## 2021-11-19 NOTE — Telephone Encounter (Signed)
Spoke with patient's daughter [name] and scheduled a FaceTime Palliative Consult for 11/25/21 @ 1:30 PM.  ? ?Consent obtained; updated Outlook/Netsmart/Team List and Epic.  ? ?

## 2021-11-25 ENCOUNTER — Other Ambulatory Visit: Payer: Self-pay

## 2021-11-25 ENCOUNTER — Other Ambulatory Visit: Payer: Medicare Other | Admitting: Hospice

## 2021-11-25 DIAGNOSIS — M545 Low back pain, unspecified: Secondary | ICD-10-CM

## 2021-11-25 DIAGNOSIS — F0390 Unspecified dementia without behavioral disturbance: Secondary | ICD-10-CM

## 2021-11-25 DIAGNOSIS — Z515 Encounter for palliative care: Secondary | ICD-10-CM

## 2021-11-25 DIAGNOSIS — I10 Essential (primary) hypertension: Secondary | ICD-10-CM

## 2021-11-25 NOTE — Progress Notes (Signed)
? ? ?Manufacturing engineer ?Community Palliative Care Consult Note ?Telephone: [phone]  ?Fax: [phone] ? ?PATIENT NAME: Emily Dawson ?[place] [address] ?[phone] (home)  ?DOB: 09/12/1933 ?MRN: [id number] ? ?PRIMARY CARE PROVIDER:    ?Lauree Chandler, NP,  ?[address]. ?[place] [address] ?[phone] ? ?REFERRING PROVIDER:   ?Lauree Chandler, NP ?[address] ?[place],  [place] [address] ?[phone] ? ?RESPONSIBLE PARTY:   [name] - daughter [phone] ?Patient likes to be Emily Dawson ?Contact Information   ? ? Name Relation Home Work Mobile  ? Earnshaw,James Spouse   [phone]  ? [name],[name] Daughter   [phone]  ? Oretha Caprice   [phone]  ? [name] Daughter   [phone]  ? ?  ? ? ?TELEHEALTH VISIT STATEMENT ?Due to the COVID-19 crisis, this visit was done via telemedicine from my office and it was initiated and consent by this patient and or family.  ?I connected with patient OR PROXY by a telephone/video  and verified that I am speaking with the correct person. I discussed the limitations of evaluation and management by telemedicine. The patient expressed understanding and agreed to proceed. ?Palliative Care was asked to follow this patient to address advance care planning, complex medical decision making and goals of care clarification. This is the initial visit.  ?  ASSESSMENT AND / RECOMMENDATIONS:  ? ?Advance Care Planning: Our advance care planning conversation included a discussion about:    ?The value and importance of advance care planning  ?Difference between Hospice and Palliative care ?Exploration of goals of care in the event of a sudden injury or illness  ?Identification and preparation of a healthcare agent  ?Review and updating or creation of an  advance directive document . ?Decision not to resuscitate or to de-escalate disease focused treatments due to poor prognosis. ? ?CODE STATUS: DNR ? ?Goals of Care: Goals include to  maximize quality of life and symptom management. ?MOST selections include comfort measures, no antibiotics, no IV fluids, no feeding tube. Family is interested in hospice service when patient qualifies for it.  ? ?I spent 16 minutes providing this initial consultation. More than 50% of the time in this consultation was spent on counseling patient and coordinating communication. ?-------------------------------------------------------------------------------------------------------------------------------------- ? ?Symptom Management/Plan: ?Dementia: Memory loss/confusion is ongoing in line with Dementia disease trajectory. Patient is ambulatory without assistive device, continent of bowel and bladder. Continue Namenda and ongoing supportive care. Encourage puzzles/word search, reminiscence.  ?Low back pain: Managed with Tylenol. Continue activities/exercise as tolerated, heat pad. ?Hypertension: Managed with Metoprolol. ?Weight loss: 5 feet 4 inches, weight 90 Ibs down 99 Ibs 6 months ago. Provide enough time and Appetite is good. Continue Boost for nutritional augmentation ?Routine CBC CMP. ?Follow up: Palliative care will continue to follow for complex medical decision making, advance care planning, and clarification of goals. Return 6 weeks or prn. Encouraged to call provider sooner with any concerns.  ? ?Family /Caregiver/Community Supports: Patient lives at home with spouse who is primary caregiver. Daughter- [name], a med surg nurse visits often and coordinates her care. Strong family support system.  ? ?HOSPICE ELIGIBILITY/DIAGNOSIS: TBD ? ?Chief Complaint: Initial Palliative care visit ? ?HISTORY OF PRESENT ILLNESS:  Emily Dawson is a 88 y.o. year old female  with multiple morbidities requiring close monitoring and with high risk of complications and mortality: Dementia,  Hypertension. History of breast cancer s/p double mastectomy.  History obtained from review of EMR, discussion with primary team,  caregiver, family and/or  Emily Dawson.  ?Review and summarization of Epic records shows history from other than patient. Rest of 10 point ROS asked and negative.  ?Independent interpretation of tests and reviewed as needed, available labs, patient records, imaging, studies and related documents from the EMR. ? ? ?ROS ?General: NAD ?EYES: denies vision changes ?ENMT: denies dysphagia ?Cardiovascular: denies chest pain/discomfort ?Pulmonary: denies cough, denies SOB ?Abdomen: endorses good appetite, denies constipation/diarrhea ?GU: denies dysuria, urinary frequency ?MSK:  endorses weakness,  no falls reported ?Skin: denies rashes or wounds ?Neurological: denies pain, denies insomnia ?Psych: Endorses positive mood ?Heme/lymph/immuno: denies bruises, abnormal bleeding ? ? ?PAST MEDICAL HISTORY:  ?Active Ambulatory Problems  ?  Diagnosis Date Noted  ? Osteoporosis   ? Vitamin D deficiency   ? Dementia ([place])   ? Hypertension   ? Chronic kidney disease   ? Low TSH level   ? ?Resolved Ambulatory Problems  ?  Diagnosis Date Noted  ? No Resolved Ambulatory Problems  ? ?Past Medical History:  ?Diagnosis Date  ? Abnormal thyroid blood test 11/04/2010  ? Alzheimer disease ([place])   ? Angle-closure glaucoma   ? Arthritis   ? Breast cancer ([place])   ? Chronic UTI   ? Colon polyps   ? Eczema   ? Glaucoma   ? H/O angiography 02/2019  ? H/O CT scan of head 01/2020  ? H/O cystoscopy 03/12/2014  ? H/O laser iridotomy 06/2015  ? Hematuria   ? Henoch-Schonlein purpura ([place])   ? Hives   ? Memory loss   ? Retinal vein occlusion of left eye   ? Thyroid disease   ? ? ?SOCIAL HX:  ?Social History  ? ?Tobacco Use  ? Smoking status: Never  ? Smokeless tobacco: Never  ?Substance Use Topics  ? Alcohol use: Never  ? ?  ?FAMILY HX:  ?Family History  ?Problem Relation Age of Onset  ? Cancer Mother   ? Heart disease Father   ? Hypertension Son   ? Hypertension Daughter   ?   ? ?ALLERGIES:  ?Allergies  ?Allergen Reactions  ? Nsaids   ? Ampicillin   ?  Arimidex [Anastrozole] Other (See Comments)  ?  Kidney problems   ? Azithromycin   ? Celecoxib   ? Fosamax [Alendronate] Other (See Comments)  ?  Kidney problem   ? Ibuprofen   ? Naproxen   ? Penicillins   ? Sulfa Antibiotics   ? Tetracyclines & Related   ? Losartan Rash  ?   ? ?PERTINENT MEDICATIONS:  ?Outpatient Encounter Medications as of 11/25/2021  ?Medication Sig  ? acetaminophen (TYLENOL) 500 MG tablet Take 1 tablet (500 mg total) by mouth every 8 (eight) hours as needed for moderate pain.  ? b complex vitamins capsule Take 1 capsule by mouth daily.  ? Carboxymeth-Glyc-Polysorb PF (REFRESH OPTIVE ADVANCED PF) 0.5-1-0.5 % SOLN Apply 1 drop to eye in the morning and at bedtime.  ? dorzolamidel-timolol (COSOPT) 22.3-6.8 MG/ML SOLN ophthalmic solution Place 1 drop into both eyes 2 (two) times daily.  ? latanoprost (XALATAN) 0.005 % ophthalmic solution Place 1 drop into both eyes at bedtime.  ? memantine (NAMENDA) 10 MG tablet TAKE 1 TABLET BY MOUTH TWICE A DAY  ? metoprolol succinate (TOPROL-XL) 100 MG 24 hr tablet Take 1 tablet (100 mg total) by mouth daily. TAKE WITH OR IMMEDIATELY FOLLOWING A MEAL.  ? mineral oil-hydrophilic petrolatum (AQUAPHOR) ointment Apply topically as needed for dry skin. Both legs  ? triamcinolone cream (KENALOG) 0.1 % Apply  1 application topically 2 (two) times daily.  ? VITAMIN D, CHOLECALCIFEROL, PO Take 2,000 Units by mouth daily. Liquid  ? ?No facility-administered encounter medications on file as of 11/25/2021.  ? ? ?Thank you for the opportunity to participate in the care of Emily Dawson.  The palliative care team will continue to follow. Please call our office at [phone] if we can be of additional assistance.  ? ?Note: Portions of this note were generated with Lobbyist. Dictation errors may occur despite best attempts at proofreading. ? ?Teodoro Spray, NP  ? ?  ?

## 2021-11-30 ENCOUNTER — Encounter: Payer: Self-pay | Admitting: Nurse Practitioner

## 2021-12-02 ENCOUNTER — Ambulatory Visit (INDEPENDENT_AMBULATORY_CARE_PROVIDER_SITE_OTHER): Payer: Medicare Other | Admitting: Family

## 2021-12-02 VITALS — BP 156/98 | HR 66 | Temp 97.3°F | Resp 18 | Ht 60.0 in | Wt 89.0 lb

## 2021-12-02 DIAGNOSIS — L853 Xerosis cutis: Secondary | ICD-10-CM | POA: Diagnosis not present

## 2021-12-02 DIAGNOSIS — G309 Alzheimer's disease, unspecified: Secondary | ICD-10-CM | POA: Diagnosis not present

## 2021-12-02 DIAGNOSIS — F0280 Dementia in other diseases classified elsewhere without behavioral disturbance: Secondary | ICD-10-CM

## 2021-12-02 DIAGNOSIS — R55 Syncope and collapse: Secondary | ICD-10-CM

## 2021-12-02 DIAGNOSIS — R21 Rash and other nonspecific skin eruption: Secondary | ICD-10-CM | POA: Diagnosis not present

## 2021-12-02 LAB — COMPLETE METABOLIC PANEL WITH GFR
AG Ratio: 1.6 (calc) (ref 1.0–2.5)
ALT: 10 U/L (ref 6–29)
AST: 13 U/L (ref 10–35)
Albumin: 4.4 g/dL (ref 3.6–5.1)
Alkaline phosphatase (APISO): 76 U/L (ref 37–153)
BUN: 23 mg/dL (ref 7–25)
CO2: 28 mmol/L (ref 20–32)
Calcium: 9.9 mg/dL (ref 8.6–10.4)
Chloride: 107 mmol/L (ref 98–110)
Creat: 0.9 mg/dL (ref 0.60–0.95)
Globulin: 2.7 g/dL (calc) (ref 1.9–3.7)
Glucose, Bld: 108 mg/dL (ref 65–139)
Potassium: 4.1 mmol/L (ref 3.5–5.3)
Sodium: 143 mmol/L (ref 135–146)
Total Bilirubin: 0.6 mg/dL (ref 0.2–1.2)
Total Protein: 7.1 g/dL (ref 6.1–8.1)
eGFR: 63 mL/min/{1.73_m2} (ref >=60)

## 2021-12-02 LAB — CBC WITH DIFFERENTIAL/PLATELET
Absolute Monocytes: 773 cells/uL (ref 200–950)
Basophils Absolute: 53 cells/uL (ref 0–200)
Basophils Relative: 0.7 %
Eosinophils Absolute: 53 cells/uL (ref 15–500)
Eosinophils Relative: 0.7 %
HCT: 43.9 % (ref 35.0–45.0)
Hemoglobin: 15.2 g/dL (ref 11.7–15.5)
Lymphs Abs: 2273 cells/uL (ref 850–3900)
MCH: 32.7 pg (ref 27.0–33.0)
MCHC: 34.6 g/dL (ref 32.0–36.0)
MCV: 94.4 fL (ref 80.0–100.0)
MPV: 12.5 fL (ref 7.5–12.5)
Monocytes Relative: 10.3 %
Neutro Abs: 4350 cells/uL (ref 1500–7800)
Neutrophils Relative %: 58 %
Platelets: 217 10*3/uL (ref 140–400)
RBC: 4.65 10*6/uL (ref 3.80–5.10)
RDW: 12.2 % (ref 11.0–15.0)
Total Lymphocyte: 30.3 %
WBC: 7.5 10*3/uL (ref 3.8–10.8)

## 2021-12-02 NOTE — Progress Notes (Signed)
? ?Provider: Marlowe Sax FNP-C ? ?Lauree Chandler, NP ? ?Patient Care Team: ?Lauree Chandler, NP as PCP - General (Geriatric Medicine) ? ?Extended Emergency Contact Information ?Primary Emergency Contact: Beane,James ?Mobile Phone: [phone] ?Relation: Spouse ?Preferred language: English ?Secondary Emergency Contact: [name],[name] ?Address: [address] ?         [place], [place] [address] [place] ?Mobile Phone: [phone] ?Relation: Daughter ?Preferred language: English ?Interpreter needed? No ? ?Code Status:  DNR ?Goals of care: Advanced Directive information ? ?  09/24/2021  ? 11:21 AM  ?Advanced Directives  ?Does Patient Have a Medical Advance Directive? Yes  ?Type of Advance Directive Healthcare Power of Attorney  ?Does patient want to make changes to medical advance directive? No - Patient declined  ?Copy of [place] in Chart? Yes - validated most recent copy scanned in chart (See row information)  ? ? ? ?Chief Complaint  ?Patient presents with  ? Acute Visit  ? lab work  ? ? ?HPI:  ?Pt is a 88 y.o. female seen today for an acute visit for passing out. ?Had an episode of passing out on 11/30/2021 has been was unable to wake up despite sternum rub.she was was evaluated by EMS but did not go to the emergency room. No urine incontinence or bowel reported.  She woke up when her blood pressure was being checked.  All vital signs were within normal.  Daughter states Patient had episode in January and another one in February 2023 ? ?Daughter wanted her to be seen here today and possible have lab work.She was referred to [place] and palliative care but was told not a candidate.  Palliative care is scheduled to follow-up. ?Has had additional 1 pound weight loss since she was last seen in January 2023.  She drinks shakes and protein supplements sometimes at least once a day. ? ?Past Medical History:  ?Diagnosis Date  ? Abnormal thyroid blood test 11/04/2010  ?  Dr.Berglind's lab results indicated TSH was low, Per records provided by patient at new patient appointment   ? Alzheimer disease ([place])   ? Per [facility] New Patient packet  ? Angle-closure glaucoma   ? Per records provided by patient at new patient appointment   ? Arthritis   ? Per [facility] New Patient packet  ? Breast cancer ([place])   ? Chemo 04/2001-07/2001, Per [facility] New Patient packet  ? Chronic kidney disease   ? Per [facility] New Patient packet  ? Chronic UTI   ? Per [facility] New Patient packet  ? Colon polyps   ? Per [facility] New Patient packet  ? Dementia ([place])   ? Per [facility] New Patient packet  ? Eczema   ? Per [facility] New Patient packet  ? Glaucoma   ? Per [facility] New Patient packet  ? H/O angiography 02/2019  ? Per [facility] New Patient Packet  ? H/O CT scan of head 01/2020  ? Per [facility] New Patient Packet  ? H/O cystoscopy 03/12/2014  ? Dr.Kelly Venia Minks, Per [facility] New Patient packet  ? H/O laser iridotomy 06/2015  ? Per [facility] New Patient Packet  ? Hematuria   ? Per [facility] New Patient packet  ? Henoch-Schonlein purpura ([place])   ? Dr.Heher, Per [facility] New Patient packet  ? Hives   ? Per records provided by patient at new patient appointment   ? Hypertension   ? Per [facility] New Patient packet  ? Low TSH level   ? Memory loss   ?  Over the past 6 years (as of 2021), Per [facility] New Patient packet  ? Osteoporosis   ? Per [facility] New Patient packet  ? Retinal vein occlusion of left eye   ? Per [facility] New Patient packet  ? Thyroid disease   ? Per [facility] New Patient packet  ? Vitamin D deficiency   ? Per [facility] New Patient packet  ? ?Past Surgical History:  ?Procedure Laterality Date  ? ABDOMINAL HYSTERECTOMY    ? Fibroid tumor. Patient with ovaries. Per [facility] New Patient packet  ? BIOPSY THYROID    ? Dr.Rahmani, Per [facility] New Patient packet  ? BREAST BIOPSY  02/1987  ? Per [facility] New Patient Packet  ? BREAST BIOPSY  01/2001  ? Per [facility] New Patient packet  ? COLONOSCOPY  05/17/2017  ? Per [facility] New Patient packet, performed by Dr. Steffanie Dunn at [facility]. Polyp was removed.  ?  MASTECTOMY  02/2001  ? Per [facility] New Patient packet  ? MASTECTOMY MODIFIED RADICAL  03/1987  ? Per [facility] New Patient packet  ? RENAL BIOPSY  2003  ? Done in [place]. Per records provided by patient at new patient appointment   ? TONSILLECTOMY  1944  ? Per [facility] New Patient packet  ? ? ?Allergies  ?Allergen Reactions  ? Nsaids   ? Ampicillin   ? Arimidex [Anastrozole] Other (See Comments)  ?  Kidney problems   ? Azithromycin   ? Celecoxib   ? Fosamax [Alendronate] Other (See Comments)  ?  Kidney problem   ? Ibuprofen   ? Naproxen   ? Penicillins   ? Sulfa Antibiotics   ? Tetracyclines & Related   ? Losartan Rash  ? ? ?Outpatient Encounter Medications as of 12/02/2021  ?Medication Sig  ? b complex vitamins capsule Take 1 capsule by mouth daily.  ? Carboxymeth-Glyc-Polysorb PF (REFRESH OPTIVE ADVANCED PF) 0.5-1-0.5 % SOLN Apply 1 drop to eye in the morning and at bedtime.  ? dorzolamidel-timolol (COSOPT) 22.3-6.8 MG/ML SOLN ophthalmic solution Place 1 drop into both eyes 2 (two) times daily.  ? latanoprost (XALATAN) 0.005 % ophthalmic solution Place 1 drop into both eyes at bedtime.  ? memantine (NAMENDA) 10 MG tablet TAKE 1 TABLET BY MOUTH TWICE A DAY  ? metoprolol succinate (TOPROL-XL) 100 MG 24 hr tablet Take 1 tablet (100 mg total) by mouth daily. TAKE WITH OR IMMEDIATELY FOLLOWING A MEAL.  ? mineral oil-hydrophilic petrolatum (AQUAPHOR) ointment Apply topically as needed for dry skin. Both legs  ? VITAMIN D, CHOLECALCIFEROL, PO Take 2,000 Units by mouth daily. 1 capsule daily  ? acetaminophen (TYLENOL) 500 MG tablet Take 1 tablet (500 mg total) by mouth every 8 (eight) hours as needed for moderate pain. (Patient not taking: Reported on 12/02/2021)  ? triamcinolone cream (KENALOG) 0.1 % Apply 1 application topically 2 (two) times daily. (Patient not taking: Reported on 12/02/2021)  ? ?No facility-administered encounter medications on file as of 12/02/2021.  ? ? ?Review of Systems  ?Unable to perform ROS: Dementia  (Information provided by patient's husband and daughter)  ?Constitutional:  Negative for appetite change, chills, fatigue, fever and unexpected weight change.  ?HENT:  Negative for congestion, dental problem, ear discharge, ear pain, facial swelling, hearing loss, nosebleeds, postnasal drip, rhinorrhea, sinus pressure, sinus pain, sneezing, sore throat, tinnitus and trouble swallowing.   ?Eyes:  Negative for pain, discharge, redness, itching and visual disturbance.  ?Respiratory:  Negative for cough, chest tightness, shortness of breath and wheezing.   ?  Cardiovascular:  Negative for chest pain, palpitations and leg swelling.  ?Gastrointestinal:  Negative for abdominal distention, abdominal pain, blood in stool, constipation, diarrhea, nausea and vomiting.  ?Genitourinary:  Negative for difficulty urinating, dysuria, flank pain, frequency and urgency.  ?Musculoskeletal:  Negative for arthralgias, back pain, gait problem, joint swelling, myalgias, neck pain and neck stiffness.  ?Skin:  Negative for color change, pallor, rash and wound.  ?Neurological:  Negative for dizziness, speech difficulty, weakness, light-headedness, numbness and headaches.  ?     Syncopal episode 2 days ago  ?Hematological:  Does not bruise/bleed easily.  ?Psychiatric/Behavioral:  Negative for behavioral problems, hallucinations and sleep disturbance. The patient is not nervous/anxious.   ?     Memory loss  ? ?Immunization History  ?Administered Date(s) Administered  ? Fluad Quad(high Dose 65+) 06/19/2020, 06/01/2021  ? PFIZER(Purple Top)SARS-COV-2 Vaccination 11/05/2019, 11/28/2019, 07/15/2020  ? Pneumococcal-Unspecified 02/15/2011  ? ?Pertinent  Health Maintenance Due  ?Topic Date Due  ? DEXA SCAN  Never done  ? INFLUENZA VACCINE  Completed  ? ? ?  01/25/2021  ? 11:14 AM 03/16/2021  ?  4:00 PM 03/19/2021  ?  2:24 PM 06/24/2021  ?  9:56 AM 09/24/2021  ? 11:20 AM  ?Fall Risk  ?Falls in the past year? 0 0 0 0 0  ?Was there an injury with Fall? 0 0 0  0 0  ?Fall Risk Category Calculator 0 0 0 0 0  ?Fall Risk Category Low Low Low Low Low  ?Patient Fall Risk Level Low fall risk Low fall risk Low fall risk Low fall risk Low fall risk  ?Patient at Risk for Falls Due to  No

## 2021-12-02 NOTE — Patient Instructions (Signed)
-   Encourage fluid intake at least 6-8 glasses  ? ?- Apply Cataphil creme twice daily all over the body for dry skin  ?

## 2021-12-04 ENCOUNTER — Encounter: Payer: Self-pay | Admitting: Nurse Practitioner

## 2021-12-04 DIAGNOSIS — F0280 Dementia in other diseases classified elsewhere without behavioral disturbance: Secondary | ICD-10-CM

## 2021-12-07 NOTE — Telephone Encounter (Signed)
Medication list updated.

## 2021-12-15 ENCOUNTER — Other Ambulatory Visit: Payer: Medicare Other

## 2021-12-24 ENCOUNTER — Other Ambulatory Visit: Payer: Medicare Other | Admitting: Hospice

## 2021-12-24 DIAGNOSIS — R634 Abnormal weight loss: Secondary | ICD-10-CM

## 2021-12-24 DIAGNOSIS — I10 Essential (primary) hypertension: Secondary | ICD-10-CM

## 2021-12-24 DIAGNOSIS — Z515 Encounter for palliative care: Secondary | ICD-10-CM

## 2021-12-24 DIAGNOSIS — F0390 Unspecified dementia without behavioral disturbance: Secondary | ICD-10-CM

## 2021-12-24 DIAGNOSIS — M545 Low back pain, unspecified: Secondary | ICD-10-CM

## 2021-12-24 NOTE — Progress Notes (Signed)
? ? ?Manufacturing engineer ?Community Palliative Care Consult Note ?Telephone: [phone]  ?Fax: [phone] ? ?PATIENT NAME: Emily Dawson ?[place] [address] ?[phone] (home)  ?DOB: 03-Oct-1933 ?MRN: [id number] ? ?PRIMARY CARE PROVIDER:    ?Lauree Chandler, NP,  ?[address]. ?[place] [address] ?[phone] ? ?REFERRING PROVIDER:   ?Lauree Chandler, NP ?[address] ?[place],  [place] [address] ?[phone] ? ?RESPONSIBLE PARTY:   [name] - daughter [phone] ?Patient likes to be Emily Dawson ? ?Contact Information   ? ? Name Relation Home Work Mobile  ? Lorge,James Spouse   [phone]  ? [name],[name] Daughter   [phone]  ? Oretha Caprice   [phone]  ? [name] Daughter   [phone]  ? ?  ? ? ? ?Palliative Care was asked to follow this patient to address advance care planning, complex medical decision making and goals of care clarification. ?Spouse was with patient during visit. NP called [name] and left her a vm with  call back number. [name] called and was updated on visit; she provided additional history due to patient with Dementia. ? ?  ASSESSMENT AND / RECOMMENDATIONS:  ? ?CODE STATUS: DNR ? ?Goals of Care: Goals include to maximize quality of life and symptom management. ?MOST selections include comfort measures, no antibiotics, no IV fluids, no feeding tube. Family is interested in hospice service when patient qualifies for it.  ? ?Symptom Management/Plan: ?Dementia: Difficulty finding words/Memory loss/confusion is ongoing in line with Dementia disease trajectory. Patient is ambulatory without assistive device, continent of bowel and bladder. Continue Namenda and ongoing supportive care. Encourage puzzles/word search, reminiscence.  ?Low back pain: Managed with Tylenol. Continue activities/exercise as tolerated, heat pad. ?Abnormal weight loss:  Weight loss: 5 feet 4 inches, weight 89  Ibs down 99 Ibs 6 months ago. Provide enough time and Appetite  is good. Continue Boost for nutritional augmentation ?Routine CBC CMP. ?Hypertension: Managed with Metoprolol. ?Follow up: Palliative care will continue to follow for complex medical decision making, advance care planning, and clarification of goals. Return 6 weeks or prn. Encouraged to call provider sooner with any concerns.  ? ?Family /Caregiver/Community Supports: Patient lives at home with spouse who is primary caregiver. Daughter- [name], a med surg nurse visits often and coordinates her care. Strong family support system. [name] plans to involve [facility] in the future with patient's care.  ? ?HOSPICE ELIGIBILITY/DIAGNOSIS: TBD ? ?Chief Complaint: Follow up visit ? ?HISTORY OF PRESENT ILLNESS:  Emily Dawson is a 88 y.o. year old female  with multiple morbidities requiring close monitoring and with high risk of complications and mortality: Dementia,  Hypertension, abnormal. History of breast cancer s/p double mastectomy.  History obtained from review of EMR, discussion with primary team, caregiver, family and/or Ms. Mangels.  ?Review and summarization of Epic records shows history from other than patient. Rest of 10 point ROS asked and negative.  ?Independent interpretation of tests and reviewed as needed, available labs, patient records, imaging, studies and related documents from the EMR. ? ? ?ROS ?General: NAD ?EYES: denies vision changes ?ENMT: denies dysphagia ?Cardiovascular: denies chest pain/discomfort ?Pulmonary: denies cough, denies SOB ?Abdomen: endorses good appetite, denies constipation/diarrhea ?GU: denies dysuria, urinary frequency ?MSK:  endorses weakness,  no falls reported ?Skin: denies rashes or wounds ?Neurological: endorsed occasional low back pain, not present during visit,  denies insomnia ?Psych: Endorses positive mood ?Heme/lymph/immuno: denies bruises, abnormal bleeding ? ?Physical Exam: ?  ?Constitutional: NAD ?General: Frail looking, Well groomed, pleasant ?EYES:  anicteric sclera,  lids intact, no discharge  ?ENMT: Moist mucous membrane ?CV: S1 S2, RRR ?Pulmonary: LCTA, no increased work of breathing, no cough, ?Abdomen: active BS + 4 quadrants, soft and non tender ?GU: no suprapubic tenderness ?MSK: weakness, severe sarcopenia. ?Skin: warm and dry, no rashes or wounds on visible skin ?Neuro:  weakness, otherwise non focal, memory loss ?Psych: non-anxious affect ?Hem/lymph/immuno: no widespread bruising ? ? ?PAST MEDICAL HISTORY:  ?Active Ambulatory Problems  ?  Diagnosis Date Noted  ? Osteoporosis   ? Vitamin D deficiency   ? Dementia ([place])   ? Hypertension   ? Chronic kidney disease   ? Low TSH level   ? ?Resolved Ambulatory Problems  ?  Diagnosis Date Noted  ? No Resolved Ambulatory Problems  ? ?Past Medical History:  ?Diagnosis Date  ? Abnormal thyroid blood test 11/04/2010  ? Alzheimer disease ([place])   ? Angle-closure glaucoma   ? Arthritis   ? Breast cancer ([place])   ? Chronic UTI   ? Colon polyps   ? Eczema   ? Glaucoma   ? H/O angiography 02/2019  ? H/O CT scan of head 01/2020  ? H/O cystoscopy 03/12/2014  ? H/O laser iridotomy 06/2015  ? Hematuria   ? Henoch-Schonlein purpura ([place])   ? Hives   ? Memory loss   ? Retinal vein occlusion of left eye   ? Thyroid disease   ? ? ?SOCIAL HX:  ?Social History  ? ?Tobacco Use  ? Smoking status: Never  ? Smokeless tobacco: Never  ?Substance Use Topics  ? Alcohol use: Never  ? ?  ?FAMILY HX:  ?Family History  ?Problem Relation Age of Onset  ? Cancer Mother   ? Heart disease Father   ? Hypertension Son   ? Hypertension Daughter   ?   ? ?ALLERGIES:  ?Allergies  ?Allergen Reactions  ? Nsaids   ? Ampicillin   ? Arimidex [Anastrozole] Other (See Comments)  ?  Kidney problems   ? Azithromycin   ? Celecoxib   ? Fosamax [Alendronate] Other (See Comments)  ?  Kidney problem   ? Ibuprofen   ? Naproxen   ? Penicillins   ? Sulfa Antibiotics   ? Tetracyclines & Related   ? Losartan Rash  ?   ? ?PERTINENT MEDICATIONS:  ?Outpatient Encounter Medications as of  12/24/2021  ?Medication Sig  ? acetaminophen (TYLENOL) 500 MG tablet Take 1 tablet (500 mg total) by mouth every 8 (eight) hours as needed for moderate pain. (Patient not taking: Reported on 12/02/2021)  ? b complex vitamins capsule Take 1 capsule by mouth daily.  ? Carboxymeth-Glyc-Polysorb PF (REFRESH OPTIVE ADVANCED PF) 0.5-1-0.5 % SOLN Apply 1 drop to eye in the morning and at bedtime.  ? dorzolamidel-timolol (COSOPT) 22.3-6.8 MG/ML SOLN ophthalmic solution Place 1 drop into both eyes 2 (two) times daily.  ? latanoprost (XALATAN) 0.005 % ophthalmic solution Place 1 drop into both eyes at bedtime.  ? memantine (NAMENDA) 5 MG tablet Take 5 mg by mouth 2 (two) times daily.  ? metoprolol succinate (TOPROL-XL) 100 MG 24 hr tablet Take 1 tablet (100 mg total) by mouth daily. TAKE WITH OR IMMEDIATELY FOLLOWING A MEAL.  ? mineral oil-hydrophilic petrolatum (AQUAPHOR) ointment Apply topically as needed for dry skin. Both legs  ? triamcinolone cream (KENALOG) 0.1 % Apply 1 application topically 2 (two) times daily. (Patient not taking: Reported on 12/02/2021)  ? VITAMIN D, CHOLECALCIFEROL, PO Take 2,000 Units by mouth daily. 1 capsule  daily  ? ?No facility-administered encounter medications on file as of 12/24/2021.  ? ?I spent 60 minutes providing this consultation; time includes spent with patient/family, chart review and documentation. More than 50% of the time in this consultation was spent on care coordination ?Thank you for the opportunity to participate in the care of Mr. Roselle Locus.  The palliative care team will continue to follow. Please call our office at [phone] if we can be of additional assistance.  ?  ?Thank you for the opportunity to participate in the care of Ms. Stonebraker.  The palliative care team will continue to follow. Please call our office at [phone] if we can be of additional assistance.  ? ?Note: Portions of this note were generated with [employer]. Dictation errors may occur despite  best attempts at proofreading. ? ?Teodoro Spray, NP  ? ?  ?

## 2022-03-14 ENCOUNTER — Ambulatory Visit: Payer: Medicare Other | Admitting: Nurse Practitioner

## 2022-04-01 ENCOUNTER — Telehealth: Payer: Self-pay | Admitting: Hospice

## 2022-04-01 DIAGNOSIS — Z515 Encounter for palliative care: Secondary | ICD-10-CM

## 2022-04-01 NOTE — Telephone Encounter (Signed)
Several attempts to visit patient unsuccessful, phone not answered and door locked and no one answers the door. NP called daughter [name] and left her a voicemail on the situation, with call back number

## 2022-04-11 ENCOUNTER — Ambulatory Visit (INDEPENDENT_AMBULATORY_CARE_PROVIDER_SITE_OTHER): Payer: Medicare Other | Admitting: Nurse Practitioner

## 2022-04-11 ENCOUNTER — Encounter: Payer: Self-pay | Admitting: Nurse Practitioner

## 2022-04-11 VITALS — BP 134/88 | HR 68 | Temp 94.4°F | Ht 60.0 in | Wt 87.4 lb

## 2022-04-11 DIAGNOSIS — I10 Essential (primary) hypertension: Secondary | ICD-10-CM

## 2022-04-11 DIAGNOSIS — G309 Alzheimer's disease, unspecified: Secondary | ICD-10-CM

## 2022-04-11 DIAGNOSIS — F0280 Dementia in other diseases classified elsewhere without behavioral disturbance: Secondary | ICD-10-CM

## 2022-04-11 DIAGNOSIS — E559 Vitamin D deficiency, unspecified: Secondary | ICD-10-CM | POA: Diagnosis not present

## 2022-04-11 DIAGNOSIS — R636 Underweight: Secondary | ICD-10-CM | POA: Diagnosis not present

## 2022-04-11 NOTE — Progress Notes (Signed)
Careteam: Patient Care Team: Lauree Chandler, NP as PCP - General (Geriatric Medicine)  PLACE OF SERVICE:  [place] Directive information Does Patient Have a Medical Advance Directive?: Yes, Type of Advance Directive: [place];Out of facility DNR (pink MOST or yellow form), Pre-existing out of facility DNR order (yellow form or pink MOST form): Yellow form placed in chart (order not valid for inpatient use), Does patient want to make changes to medical advance directive?: No - Patient declined  Allergies  Allergen Reactions   Nsaids    Ampicillin    Arimidex [Anastrozole] Other (See Comments)    Kidney problems    Azithromycin    Celecoxib    Fosamax [Alendronate] Other (See Comments)    Kidney problem    Ibuprofen    Naproxen    Penicillins    Sulfa Antibiotics    Tetracyclines & Related    Losartan Rash    Chief Complaint  Patient presents with   Medical Management of Chronic Issues    6 month follow-up. Discuss need for td/tdap, shingrix, pcv, dexa, covid booster, and flu vaccine (out of stock). [facility] verified      HPI: Patient is a 88 y.o. female who presents today for routine visit. Pt is Alert and pleasant but does have dementia so most of the history is obtained from daughter.   Pt denies pain or any other acute complaints.   Daughter states there have been no syncopal episodes since the last visit and thinks that it may be because she decreased her namenda dose to 2.5 mg.   For exercise she does a lot of walking. States she is always walking around.   Continues to follow w/ eye doctor s/p cataract surgery.   Appetite is fair. Weight is stable. She is still drinking boost supplemental shakes. Bowels are moving ok.   Patient continues to ambulate independently. Minor behavior issues. Daughter states that there are good and bad days, as to be expected but more good days than bad days.   Tried cetaphil for skin w/o much  improvement, but also notes that compliance hasn't been great.  Palliative is now following.  Review of Systems:  Review of Systems  Reason unable to perform ROS: Dementia.  Psychiatric/Behavioral:  Positive for memory loss.     Past Medical History:  Diagnosis Date   Abnormal thyroid blood test 11/04/2010   Dr.Berglind's lab results indicated TSH was low, Per records provided by patient at new patient appointment    Alzheimer disease [facility])    Per [facility] New Patient packet   Angle-closure glaucoma    Per records provided by patient at new patient appointment    Arthritis    Per [facility] New Patient packet   Breast cancer ([place])    Chemo 04/2001-07/2001, Per [facility] New Patient packet   Chronic kidney disease    Per [facility] New Patient packet   Chronic UTI    Per [facility] New Patient packet   Colon polyps    Per [facility] New Patient packet   Dementia [facility])    Per [facility] New Patient packet   Eczema    Per [facility] New Patient packet   Glaucoma    Per [facility] New Patient packet   H/O angiography 02/2019   Per [place] Patient Packet   H/O CT scan of head 01/2020   Per [facility] New Patient Packet   H/O cystoscopy 03/12/2014   Dr.Kelly Venia Minks, Per [facility] New Patient  packet   H/O laser iridotomy 06/2015   Per [facility] New Patient Packet   Hematuria    Per [facility] New Patient packet   Henoch-Schonlein purpura [facility])    Dr.Heher, Per [facility] New Patient packet   Hives    Per records provided by patient at new patient appointment    Hypertension    Per [facility] New Patient packet   Low TSH level    Memory loss    Over the past 6 years (as of 2021), Per [facility] New Patient packet   Osteoporosis    Per [facility] New Patient packet   Retinal vein occlusion of left eye    Per [facility] New Patient packet   Thyroid disease    Per [facility] New Patient packet   Vitamin D deficiency    Per [facility] New Patient packet   Past Surgical History:  Procedure Laterality Date   ABDOMINAL HYSTERECTOMY     Fibroid tumor. Patient with ovaries. Per [facility] New Patient packet    BIOPSY THYROID     Dr.Rahmani, Per [facility] New Patient packet   BREAST BIOPSY  02/1987   Per [facility] New Patient Packet   BREAST BIOPSY  01/2001   Per [facility] New Patient packet   COLONOSCOPY  05/17/2017   Per [facility] New Patient packet, performed by Dr. Steffanie Dunn at [facility]. Polyp was removed.   MASTECTOMY  02/2001   Per [facility] New Patient packet   MASTECTOMY MODIFIED RADICAL  03/1987   Per [facility] New Patient packet   RENAL BIOPSY  2003   Done in [place]. Per records provided by patient at new patient appointment    TONSILLECTOMY  1944   Per [facility] New Patient packet   Social History:   reports that she has never smoked. She has never used smokeless tobacco. She reports that she does not drink alcohol and does not use drugs.  Family History  Problem Relation Age of Onset   Cancer Mother    Heart disease Father    Hypertension Son    Hypertension Daughter     Medications: Patient's Medications  New Prescriptions   No medications on file  Previous Medications   ACETAMINOPHEN (TYLENOL) 500 MG TABLET    Take 1 tablet (500 mg total) by mouth every 8 (eight) hours as needed for moderate pain.   B COMPLEX VITAMINS CAPSULE    Take 1 capsule by mouth daily.   CARBOXYMETH-GLYC-POLYSORB PF (REFRESH OPTIVE ADVANCED PF) 0.5-1-0.5 % SOLN    Apply 1 drop to eye in the morning and at bedtime.   DORZOLAMIDEL-TIMOLOL (COSOPT) 22.3-6.8 MG/ML SOLN OPHTHALMIC SOLUTION    Place 1 drop into both eyes 2 (two) times daily.   LATANOPROST (XALATAN) 0.005 % OPHTHALMIC SOLUTION    Place 1 drop into both eyes at bedtime.   MEMANTINE (NAMENDA) 5 MG TABLET    Take 2.5 mg by mouth 2 (two) times daily.   METOPROLOL SUCCINATE (TOPROL-XL) 100 MG 24 HR TABLET    Take 1 tablet (100 mg total) by mouth daily. TAKE WITH OR IMMEDIATELY FOLLOWING A MEAL.   MINERAL OIL-HYDROPHILIC PETROLATUM (AQUAPHOR) OINTMENT    Apply topically as needed for dry skin. Both legs   VITAMIN D, CHOLECALCIFEROL, PO    Take 2,000  Units by mouth daily. 1 capsule daily  Modified Medications   No medications on file  Discontinued Medications   TRIAMCINOLONE CREAM (KENALOG) 0.1 %    Apply 1 application topically 2 (two) times daily.  Physical Exam:  Vitals:   04/11/22 1248  BP: 134/88  Pulse: 68  Temp: (!) 94.4 F (34.7 C)  SpO2: 98%  Weight: 39.6 kg  Height: 5' (1.524 m)   Body mass index is 17.07 kg/m. Wt Readings from Last 3 Encounters:  04/11/22 39.6 kg  12/02/21 40.4 kg  09/24/21 40.8 kg    Physical Exam Constitutional:      General: She is not in acute distress. HENT:     Right Ear: Tympanic membrane, ear canal and external ear normal. There is no impacted cerumen.     Left Ear: Tympanic membrane, ear canal and external ear normal. There is no impacted cerumen.  Eyes:     Conjunctiva/sclera: Conjunctivae normal.  Cardiovascular:     Rate and Rhythm: Normal rate and regular rhythm.     Pulses: Normal pulses.     Heart sounds: Normal heart sounds.  Pulmonary:     Effort: Pulmonary effort is normal. No respiratory distress.     Breath sounds: Normal breath sounds.  Abdominal:     General: Bowel sounds are normal. There is no distension.     Palpations: Abdomen is soft.     Tenderness: There is no abdominal tenderness.  Musculoskeletal:        General: Normal range of motion.     Right lower leg: No edema.     Left lower leg: No edema.  Neurological:     Mental Status: She is alert. Mental status is at baseline.  Psychiatric:        Cognition and Memory: Memory is impaired.     Labs reviewed: Basic Metabolic Panel: Recent Labs    09/24/21 1152 12/02/21 1159  NA 144 143  K 3.5 4.1  CL 107 107  CO2 31 28  GLUCOSE 78 108  BUN 21 23  CREATININE 0.90 0.90  CALCIUM 9.7 9.9   Liver Function Tests: Recent Labs    09/24/21 1152 12/02/21 1159  AST 13 13  ALT 9 10  BILITOT 0.8 0.6  PROT 6.9 7.1   No results for input(s): "LIPASE", "AMYLASE" in the last 8760 hours. No  results for input(s): "AMMONIA" in the last 8760 hours. CBC: Recent Labs    09/24/21 1152 12/02/21 1159  WBC 8.6 7.5  NEUTROABS 5,194 4,350  HGB 14.7 15.2  HCT 43.0 43.9  MCV 94.3 94.4  PLT 209 217   Lipid Panel: No results for input(s): "CHOL", "HDL", "LDLCALC", "TRIG", "CHOLHDL", "LDLDIRECT" in the last 8760 hours. TSH: No results for input(s): "TSH" in the last 8760 hours. A1C: No results found for: "HGBA1C"   Assessment/Plan  1. Hypertension, unspecified type - controlled  - continue metoprolol  2. Alzheimer's disease ([place]) - no behavioral issues  - continue namenda - palliative now following  -continue supportive care  3. Underweight - stable, liberalizing diet - continue supplemental shakes   4. Vitamin D deficiency - continue Vitamin D supplement   Return in about 6 months (around 10/12/2022) for routine follow up, labs at appt .  Student- Waunita Schooner, RN -I personally was present during the history, physical exam and medical decision-making activities of this service and have verified that the service and findings are accurately documented in the student's note  Chenoah Mcnally [employer], [facility] [phone]

## 2022-04-22 ENCOUNTER — Other Ambulatory Visit: Payer: Medicare Other | Admitting: Hospice

## 2022-04-22 DIAGNOSIS — Z515 Encounter for palliative care: Secondary | ICD-10-CM

## 2022-04-22 DIAGNOSIS — R634 Abnormal weight loss: Secondary | ICD-10-CM

## 2022-04-22 DIAGNOSIS — F0390 Unspecified dementia without behavioral disturbance: Secondary | ICD-10-CM

## 2022-04-22 DIAGNOSIS — M545 Low back pain, unspecified: Secondary | ICD-10-CM

## 2022-04-22 NOTE — Progress Notes (Signed)
[facility] Consult Note Telephone: [phone]  Fax: [phone]  PATIENT NAME: Emily Dawson [address] [place] [address] [phone] (home)  DOB: May 07, 1934 MRN: [id number]  PRIMARY CARE PROVIDER:    Lauree Chandler, NP,  [place] [address] [phone]  REFERRING PROVIDER:   Lauree Chandler, NP [place],  [place] [address] [phone]  RESPONSIBLE PARTY:   Emily Dawson - daughter [phone] Patient likes to be [place]     Name Relation Home Work Mobile   Emily Dawson Spouse   [phone]   Emily Dawson Daughter   [phone]   Emily Dawson   [phone]   Emily Dawson Daughter   [phone]        Palliative Care was asked to follow this patient to address advance care planning, complex medical decision making and goals of care clarification. Emily Dawson and patient's spouse are with patient during visit.     ASSESSMENT AND / RECOMMENDATIONS:   CODE STATUS: DNR  Goals of Care: Goals include to maximize quality of life and symptom management. MOST selections include comfort measures, no antibiotics, no IV fluids, no feeding tube.   Family is interested in hospice service due to ongoing weight loss, worsening memory issues,  and decline in functional status. PCP in agreement for hospice service and will fax in order for hospice referral.   Symptom Management/Plan: Abnormal weight loss:  Worsening weight loss: 5 feet 4 inches, weight 85  Ibs down 99 Ibs 6 months ago. BMI < 15. Family is ready for hospice service. PCP to fax in hospice referral to [facility]. Comfort care only. Provide enough time for meals, offer help as needed to ensure adequate oral intake. Continue Boost for nutritional augmentation.  Dementia: Difficulty finding words/Memory loss/confusion is ongoing in line with Dementia disease trajectory. Patient is ambulatory without  assistive device, continent of bowel and bladder. Continue Namenda and ongoing supportive care. Encourage puzzles/word search, reminiscence.  Low back pain: Managed with Tylenol. Continue activities/exercise as tolerated, heat pad.  Follow up: Palliative care will continue to follow for complex medical decision making, advance care planning, and clarification of goals. Return 6 weeks or prn. Encouraged to call provider sooner with any concerns.   Family /Caregiver/Community Supports: Patient lives at home with spouse who is primary caregiver. Daughter- Emily Dawson, a med surg nurse visits often and coordinates her care. Strong family support system. Emily Dawson plans to involve [facility] in the future with patient's care.   HOSPICE ELIGIBILITY/DIAGNOSIS: TBD  Chief Complaint: Follow up visit  HISTORY OF PRESENT ILLNESS:  Emily Dawson is a 88 y.o. year old female  with multiple morbidities requiring close monitoring and with high risk of complications and mortality: Dementia,  Hypertension, abnormal. History of breast cancer s/p double mastectomy.  History obtained from review of EMR, discussion with primary team, caregiver, family and/or Emily Dawson.  Review and summarization of Epic records shows history from other than patient. Rest of 10 point ROS asked and negative.  Independent interpretation of tests and reviewed as needed, available labs, patient records, imaging, studies and related documents from the EMR.   ROS General: NAD EYES: denies vision changes ENMT: denies dysphagia Cardiovascular: denies chest pain/discomfort Pulmonary: denies cough, denies SOB Abdomen: endorses good appetite, denies constipation/diarrhea GU: denies dysuria, urinary frequency MSK:  endorses weakness,  no falls reported Skin: denies rashes or wounds Neurological: endorsed occasional low back pain, not present during visit,  denies insomnia  Psych: Endorses positive mood Heme/lymph/immuno: denies bruises, abnormal  bleeding  Physical Exam:   Constitutional: NAD General: Frail looking, Well groomed, pleasant EYES: anicteric sclera, lids intact, no discharge  ENMT: Moist mucous membrane CV: S1 S2, RRR Pulmonary: LCTA, no increased work of breathing, no cough, Abdomen: active BS + 4 quadrants, soft and non tender GU: no suprapubic tenderness MSK: weakness, severe sarcopenia. Skin: warm and dry, no rashes or wounds on visible skin Neuro:  weakness, otherwise non focal, memory loss Psych: non-anxious affect Hem/lymph/immuno: no widespread bruising   PAST MEDICAL HISTORY:  Active Ambulatory Problems    Diagnosis Date Noted   Osteoporosis    Vitamin D deficiency    Dementia (HCC)    Hypertension    Chronic kidney disease    Low TSH level    Resolved Ambulatory Problems    Diagnosis Date Noted   No Resolved Ambulatory Problems   Past Medical History:  Diagnosis Date   Abnormal thyroid blood test 11/04/2010   Alzheimer disease ([place])    Angle-closure glaucoma    Arthritis    Breast cancer ([place])    Chronic UTI    Colon polyps    Eczema    Glaucoma    H/O angiography 02/2019   H/O CT scan of head 01/2020   H/O cystoscopy 03/12/2014   H/O laser iridotomy 06/2015   Hematuria    Henoch-Schonlein purpura (HCC)    Hives    Memory loss    Retinal vein occlusion of left eye    Thyroid disease     SOCIAL HX:  Social History   Tobacco Use   Smoking status: Never   Smokeless tobacco: Never  Substance Use Topics   Alcohol use: Never     FAMILY HX:  Family History  Problem Relation Age of Onset   Cancer Mother    Heart disease Father    Hypertension Son    Hypertension Daughter       ALLERGIES:  Allergies  Allergen Reactions   Nsaids    Ampicillin    Arimidex [Anastrozole] Other (See Comments)    Kidney problems    Azithromycin    Celecoxib    Fosamax [Alendronate] Other (See Comments)    Kidney problem    Ibuprofen    Naproxen    Penicillins    Sulfa Antibiotics     Tetracyclines & Related    Losartan Rash      PERTINENT MEDICATIONS:  Outpatient Encounter Medications as of 04/22/2022  Medication Sig   acetaminophen (TYLENOL) 500 MG tablet Take 1 tablet (500 mg total) by mouth every 8 (eight) hours as needed for moderate pain.   b complex vitamins capsule Take 1 capsule by mouth daily.   Carboxymeth-Glyc-Polysorb PF (REFRESH OPTIVE ADVANCED PF) 0.5-1-0.5 % SOLN Apply 1 drop to eye in the morning and at bedtime.   dorzolamidel-timolol (COSOPT) 22.3-6.8 MG/ML SOLN ophthalmic solution Place 1 drop into both eyes 2 (two) times daily.   latanoprost (XALATAN) 0.005 % ophthalmic solution Place 1 drop into both eyes at bedtime.   memantine (NAMENDA) 5 MG tablet Take 2.5 mg by mouth 2 (two) times daily.   metoprolol succinate (TOPROL-XL) 100 MG 24 hr tablet Take 1 tablet (100 mg total) by mouth daily. TAKE WITH OR IMMEDIATELY FOLLOWING A MEAL.   mineral oil-hydrophilic petrolatum (AQUAPHOR) ointment Apply topically as needed for dry skin. Both legs   VITAMIN D, CHOLECALCIFEROL, PO Take 2,000 Units by mouth daily. 1 capsule daily   No  facility-administered encounter medications on file as of 04/22/2022.   I spent 60 minutes providing this consultation; time includes spent with patient/family, chart review and documentation. More than 50% of the time in this consultation was spent on care coordination Thank you for the opportunity to participate in the care of Mr. Roselle Locus.  The palliative care team will continue to follow. Please call our office at [phone] if we can be of additional assistance.    Thank you for the opportunity to participate in the care of Ms. Seeney.  The palliative care team will continue to follow. Please call our office at [phone] if we can be of additional assistance.   Note: Portions of this note were generated with [employer]. Dictation errors may occur despite best attempts at proofreading.  Teodoro Spray, NP

## 2022-05-10 ENCOUNTER — Telehealth: Payer: Self-pay

## 2022-05-10 ENCOUNTER — Telehealth: Payer: Self-pay | Admitting: Family Medicine

## 2022-05-10 DIAGNOSIS — Z515 Encounter for palliative care: Secondary | ICD-10-CM | POA: Insufficient documentation

## 2022-05-10 DIAGNOSIS — F0280 Dementia in other diseases classified elsewhere without behavioral disturbance: Secondary | ICD-10-CM

## 2022-05-10 NOTE — Telephone Encounter (Signed)
Order placed

## 2022-05-10 NOTE — Telephone Encounter (Signed)
Emily Dawson with [facility] called and left voicemail on clinical intake phone stating that she received a call form patient's daughter stating that palliative care told her that patient is appropriate for hospice care. They need order for hospice to evaluate and admit if appropriate. Fax [phone]  Message routed to Sherrie Mustache, NP

## 2022-05-10 NOTE — Telephone Encounter (Signed)
Left vm for [name] that Sharyn Lull was covering her mother and looks like she had recommended Hospice when she last saw her on 04/22/22.  Apologized that I was not sure where the miscommunication happened as Sharyn Lull was leaving but not sure what had been done on the referral.  Advised that I had seen that Hospice had been requested through [facility] and advised that for whatever reason the referral didn't get completed that [facility] would still help her if needed. Provided phone number for call back. Damaris Hippo FNP-C

## 2022-07-11 ENCOUNTER — Encounter: Payer: Self-pay | Admitting: Nurse Practitioner

## 2022-07-11 ENCOUNTER — Ambulatory Visit (INDEPENDENT_AMBULATORY_CARE_PROVIDER_SITE_OTHER): Payer: Medicare Other | Admitting: Nurse Practitioner

## 2022-07-11 DIAGNOSIS — Z0000 Encounter for general adult medical examination without abnormal findings: Secondary | ICD-10-CM | POA: Diagnosis not present

## 2022-07-11 NOTE — Progress Notes (Signed)
Subjective:   Donnell Beauchamp is a 88 y.o. female who presents for Medicare Annual (Subsequent) preventive examination.  Review of Systems     Cardiac Risk Factors include: hypertension;advanced age (>81mn, >>70women)     Objective:    There were no vitals filed for this visit. There is no height or weight on file to calculate BMI.     07/11/2022    3:22 PM 04/11/2022   11:33 AM 09/24/2021   11:21 AM 06/24/2021    9:57 AM 03/19/2021    2:25 PM 03/16/2021    4:01 PM 01/25/2021   11:14 AM  Advanced Directives  Does Patient Have a Medical Advance Directive? Yes Yes Yes Yes Yes Yes Yes  Type of AParamedicof [place] (pink MOST or yellow form) Healthcare Power of AGutierrezof AWarbaof AWellingtonof ADade Does patient want to make changes to medical advance directive? No - Patient declined No - Patient declined No - Patient declined No - Patient declined No - Patient declined No - Patient declined No - Patient declined  Copy of HRathbunin Chart? Yes - validated most recent copy scanned in chart (See row information) Yes - validated most recent copy scanned in chart (See row information) Yes - validated most recent copy scanned in chart (See row information)  Yes - validated most recent copy scanned in chart (See row information) Yes - validated most recent copy scanned in chart (See row information) Yes - validated most recent copy scanned in chart (See row information)  Pre-existing out of facility DNR order (yellow form or pink MOST form)  Yellow form placed in chart (order not valid for inpatient use)         Current Medications (verified) Outpatient Encounter Medications as of 07/11/2022  Medication Sig   acetaminophen (TYLENOL) 500 MG tablet Take 1 tablet (500 mg total) by mouth every 8 (eight) hours  as needed for moderate pain.   b complex vitamins capsule Take 1 capsule by mouth daily.   Carboxymeth-Glyc-Polysorb PF (REFRESH OPTIVE ADVANCED PF) 0.5-1-0.5 % SOLN Apply 1 drop to eye in the morning and at bedtime.   dorzolamidel-timolol (COSOPT) 22.3-6.8 MG/ML SOLN ophthalmic solution Place 1 drop into both eyes 2 (two) times daily.   latanoprost (XALATAN) 0.005 % ophthalmic solution Place 1 drop into both eyes at bedtime.   memantine (NAMENDA) 5 MG tablet Take 2.5 mg by mouth 2 (two) times daily.   metoprolol succinate (TOPROL-XL) 100 MG 24 hr tablet Take 1 tablet (100 mg total) by mouth daily. TAKE WITH OR IMMEDIATELY FOLLOWING A MEAL.   mineral oil-hydrophilic petrolatum (AQUAPHOR) ointment Apply topically as needed for dry skin. Both legs   VITAMIN D, CHOLECALCIFEROL, PO Take 2,000 Units by mouth daily. 1 capsule daily   No facility-administered encounter medications on file as of 07/11/2022.    Allergies (verified) Nsaids, Ampicillin, Arimidex [anastrozole], Azithromycin, Celecoxib, Fosamax [alendronate], Ibuprofen, Naproxen, Penicillins, Sulfa antibiotics, Tetracyclines & related, and Losartan   History: Past Medical History:  Diagnosis Date   Abnormal thyroid blood test 11/04/2010   Dr.Berglind's lab results indicated TSH was low, Per records provided by patient at new patient appointment    [facility]   Angle-closure glaucoma    Per records provided by patient at new patient appointment  Arthritis    Per [facility] New Patient packet   Breast cancer ([place])    Chemo 04/2001-07/2001, Per [facility] New Patient packet   Chronic kidney disease    Per [facility] New Patient packet   Chronic UTI    Per [facility] New Patient packet   Colon polyps    Per [facility] New Patient packet   Dementia [facility])    Per [facility] New Patient packet   Eczema    Per [facility] New Patient packet   Glaucoma    Per [facility] New Patient packet   H/O angiography 02/2019   Per [place] Patient Packet    H/O CT scan of head 01/2020   Per [place] New Patient Packet   H/O cystoscopy 03/12/2014   Dr.Kelly Venia Minks, Per [facility] New Patient packet   H/O laser iridotomy 06/2015   Per [facility] New Patient Packet   Hematuria    Per [facility] New Patient packet   Henoch-Schonlein purpura [facility])    Dr.Heher, Per [facility] New Patient packet   Hives    Per records provided by patient at new patient appointment    Hypertension    Per [facility] New Patient packet   Low TSH level    Memory loss    Over the past 6 years (as of 2021), Per [facility] New Patient packet   Osteoporosis    Per [facility] New Patient packet   Retinal vein occlusion of left eye    Per [facility] New Patient packet   Thyroid disease    Per [facility] New Patient packet   Vitamin D deficiency    Per [facility] New Patient packet   Past Surgical History:  Procedure Laterality Date   ABDOMINAL HYSTERECTOMY     Fibroid tumor. Patient with ovaries. Per [facility] New Patient packet   BIOPSY THYROID     Dr.Rahmani, Per [facility] New Patient packet   BREAST BIOPSY  02/1987   Per [facility] New Patient Packet   BREAST BIOPSY  01/2001   Per [place] New Patient packet   COLONOSCOPY  05/17/2017   Per [facility] New Patient packet, performed by Dr. Steffanie Dunn at [facility]. Polyp was removed.   MASTECTOMY  02/2001   Per [facility] New Patient packet   MASTECTOMY MODIFIED RADICAL  03/1987   Per [facility] New Patient packet   RENAL BIOPSY  2003   Done in [place]. Per records provided by patient at new patient appointment    Parks   Per [place] Patient packet   Family History  Problem Relation Age of Onset   Cancer Mother    Heart disease Father    Hypertension Son    Hypertension Daughter    Social History   Socioeconomic History   Marital status: Married    Spouse name: Not on file   Number of children: Not on file   Years of education: Not on file   Highest education level: Not on file  Occupational History   Not on file  Tobacco Use   Smoking status: Never   Smokeless  tobacco: Never  Vaping Use   Vaping Use: Never used  Substance and Sexual Activity   Alcohol use: Never   Drug use: Never   Sexual activity: Not on file  Other Topics Concern   Not on file  Social History Narrative   Per [facility] New Patient Packet Abstracted on 06/19/2020:      Diet:Regular diet, currently under weight      Caffeine: No  Married, if yes what year: Married, year left blank      Do you live in a house, apartment, assisted living, condo, trailer, ect: [place]      Is it one or more stories: One stories      Pets: No      Current/Past profession: Network engineer       Highest level or education completed: [employer]      Exercise:     Yes             Type and how often: Walking daily          Living Will: Yes   DNR: left blank   POA/HPOA: Yes      Functional Status:   Do you have difficulty bathing or dressing yourself? No   Do you have difficulty preparing food or eating? Yes   Do you have difficulty managing your medications? Yes   Do you have difficulty managing your finances? Yes   Do you have difficulty affording your medications? No   Social Determinants of Radio broadcast assistant Strain: Not on file  Food Insecurity: Not on file  Transportation Needs: Not on file  Physical Activity: Not on file  Stress: Not on file  Social Connections: Not on file    Tobacco Counseling Counseling given: Not Answered   Clinical Intake:  Pre-visit preparation completed: Yes  Pain : No/denies pain     BMI - recorded: 17.07 Nutritional Status: BMI <19  Underweight Nutritional Risks: None Diabetes: No  How often do you need to have someone help you when you read instructions, pamphlets, or other written materials from your doctor or pharmacy?: 5 - Always What is the last grade level you completed in school?: 12 th grade  Diabetic?NO  Interpreter Needed?: No  Information entered by :: Porsha McClurkin,CMA   Activities of Daily Living     07/11/2022    3:23 PM  In your present state of health, do you have any difficulty performing the following activities:  Hearing? 0  Vision? 0  Difficulty concentrating or making decisions? 1  Walking or climbing stairs? 0  Dressing or bathing? 1  Doing errands, shopping? 1  Preparing Food and eating ? Y  Using the Toilet? N  In the past six months, have you accidently leaked urine? Y  Do you have problems with loss of bowel control? N  Managing your Medications? Y  Managing your Finances? Y  Housekeeping or managing your Housekeeping? Y    Patient Care Team: Lauree Chandler, NP as PCP - General (Geriatric Medicine)  Indicate any recent Medical Services you may have received from other than [facility] providers in the past year (date may be approximate).     Assessment:   This is a routine wellness examination for [place].  Hearing/Vision screen Hearing Screening - Comments:: No hearing concerns Vision Screening - Comments:: Wear glasses  Dietary issues and exercise activities discussed: Current Exercise Habits: Home exercise routine, Type of exercise: walking, Time (Minutes): 30, Frequency (Times/Week): 7, Weekly Exercise (Minutes/Week): 210, Intensity: Mild   Goals Addressed   None   Depression Screen    07/11/2022    3:23 PM 06/24/2021    9:55 AM  PHQ 2/9 Scores  PHQ - 2 Score 0 0    Fall Risk    07/11/2022    3:23 PM 04/11/2022   12:52 PM 09/24/2021   11:20 AM 06/24/2021    9:56 AM 03/19/2021    2:24  PM  [place] in the past year? 0 0 0 0 0  Number falls in past yr: 0 0 0 0 0  Injury with Fall? 0 0 0 0 0  Risk for fall due to : No Fall Risks No Fall Risks No Fall Risks No Fall Risks;History of fall(s) No Fall Risks  Follow up  Falls evaluation completed Falls evaluation completed  Falls evaluation completed    [place]:  Any stairs in or around the home? No  If so, are there any without handrails? No  Home free of  loose throw rugs in walkways, pet beds, electrical cords, etc? Yes  Adequate lighting in your home to reduce risk of falls? Yes   ASSISTIVE DEVICES UTILIZED TO PREVENT FALLS:  Life alert? No  Use of a cane, walker or w/c? No  Grab bars in the bathroom? No  Shower chair or bench in shower? No  Elevated toilet seat or a handicapped toilet? No   TIMED UP AND GO:  Was the test performed? No .    Cognitive Function:        06/24/2021    9:58 AM  6CIT Screen  What Year? 4 points  What month? 3 points  What time? 3 points  Count back from 20 4 points  Months in reverse 4 points  Repeat phrase 10 points  Total Score 28 points    Immunizations Immunization History  Administered Date(s) Administered   Fluad Quad(high Dose 65+) 06/19/2020, 06/01/2021   PFIZER(Purple Top)SARS-COV-2 Vaccination 11/05/2019, 11/28/2019, 07/15/2020   Pneumococcal-Unspecified 02/15/2011    TDAP status: Due, Education has been provided regarding the importance of this vaccine. Advised may receive this vaccine at local pharmacy or Health Dept. Aware to provide a copy of the vaccination record if obtained from local pharmacy or Health Dept. Verbalized acceptance and understanding.  Flu Vaccine status: Due, Education has been provided regarding the importance of this vaccine. Advised may receive this vaccine at local pharmacy or Health Dept. Aware to provide a copy of the vaccination record if obtained from local pharmacy or Health Dept. Verbalized acceptance and understanding.  Pneumococcal vaccine status: Due, Education has been provided regarding the importance of this vaccine. Advised may receive this vaccine at local pharmacy or Health Dept. Aware to provide a copy of the vaccination record if obtained from local pharmacy or Health Dept. Verbalized acceptance and understanding.  Covid-19 vaccine status: Information provided on how to obtain vaccines.   Qualifies for Shingles Vaccine? Yes   Zostavax  completed No   Shingrix Completed?: No.    Education has been provided regarding the importance of this vaccine. Patient has been advised to call insurance company to determine out of pocket expense if they have not yet received this vaccine. Advised may also receive vaccine at local pharmacy or Health Dept. Verbalized acceptance and understanding.  Screening Tests Health Maintenance  Topic Date Due   TETANUS/TDAP  Never done   Zoster Vaccines- Shingrix (1 of 2) Never done   Pneumonia Vaccine 63+ Years old (1 - PCV) 05/13/2001   COVID-19 Vaccine (4 - Pfizer series) 09/09/2020   INFLUENZA VACCINE  04/05/2022   HPV VACCINES  Aged Out   DEXA SCAN  Discontinued    Health Maintenance  Health Maintenance Due  Topic Date Due   TETANUS/TDAP  Never done   Zoster Vaccines- Shingrix (1 of 2) Never done   Pneumonia Vaccine 59+ Years old (74 -  PCV) 05/13/2001   COVID-19 Vaccine (4 - Pfizer series) 09/09/2020   INFLUENZA VACCINE  04/05/2022    Colorectal cancer screening: No longer required.   Mammogram status: No longer required due to age.  No longer getting  Lung Cancer Screening: (Low Dose CT Chest recommended if Age 42-80 years, 30 pack-year currently smoking OR have quit w/in 15years.) does not qualify.   Lung Cancer Screening Referral: na  Additional Screening:  Hepatitis C Screening: does not qualify; Completed na  Vision Screening: Recommended annual ophthalmology exams for early detection of glaucoma and other disorders of the eye. Is the patient up to date with their annual eye exam?  No  If pt is not established with a provider, would they like to be referred to a provider to establish care? No .   Dental Screening: Recommended annual dental exams for proper oral hygiene  Community Resource Referral / Chronic Care Management: CRR required this visit?  No   CCM required this visit?  No      Plan:     I have personally reviewed and noted the following in the  patient's chart:   Medical and social history Use of alcohol, tobacco or illicit drugs  Current medications and supplements including opioid prescriptions. Patient is not currently taking opioid prescriptions. Functional ability and status Nutritional status Physical activity Advanced directives List of other physicians Hospitalizations, surgeries, and ER visits in previous 12 months Vitals Screenings to include cognitive, depression, and falls Referrals and appointments  In addition, I have reviewed and discussed with patient certain preventive protocols, quality metrics, and best practice recommendations. A written personalized care plan for preventive services as well as general preventive health recommendations were provided to patient.     Lauree Chandler, NP   07/11/2022    Virtual Visit via Telephone Note  I connected with patient 07/11/22 at  3:00 PM EST by telephone and verified that I am speaking with the correct person using two identifiers.  Location: Patient: home Provider: [facility]   I discussed the limitations, risks, security and privacy concerns of performing an evaluation and management service by telephone and the availability of in person appointments. I also discussed with the patient that there may be a patient responsible charge related to this service. The patient expressed understanding and agreed to proceed.   I discussed the assessment and treatment plan with the patient. The patient was provided an opportunity to ask questions and all were answered. The patient agreed with the plan and demonstrated an understanding of the instructions.   The patient was advised to call back or seek an in-person evaluation if the symptoms worsen or if the condition fails to improve as anticipated.  I provided 15 minutes of non-face-to-face time during this encounter.  Carlos American. Harle Battiest Avs printed and mailed

## 2022-07-11 NOTE — Patient Instructions (Signed)
Emily Dawson , Thank you for taking time to come for your Medicare Wellness Visit. I appreciate your ongoing commitment to your health goals. Please review the following plan we discussed and let me know if I can assist you in the future.   Screening recommendations/referrals: Colonoscopy aged out Mammogram aged out Bone Density declined Recommended yearly ophthalmology/optometry visit for glaucoma screening and checkup Recommended yearly dental visit for hygiene and checkup  Vaccinations: Influenza vaccine- due annually in September/October Pneumococcal vaccine up to date Tdap vaccine due Shingles vaccine due    Advanced directives: on file.   Conditions/risks identified: advanced age, hospice     Preventive Care 63 Years and Older, Female Preventive care refers to lifestyle choices and visits with your health care provider that can promote health and wellness. What does preventive care include? A yearly physical exam. This is also called an annual well check. Dental exams once or twice a year. Routine eye exams. Ask your health care provider how often you should have your eyes checked. Personal lifestyle choices, including: Daily care of your teeth and gums. Regular physical activity. Eating a healthy diet. Avoiding tobacco and drug use. Limiting alcohol use. Practicing safe sex. Taking low-dose aspirin every day. Taking vitamin and mineral supplements as recommended by your health care provider. What happens during an annual well check? The services and screenings done by your health care provider during your annual well check will depend on your age, overall health, lifestyle risk factors, and family history of disease. Counseling  Your health care provider may ask you questions about your: Alcohol use. Tobacco use. Drug use. Emotional well-being. Home and relationship well-being. Sexual activity. Eating habits. History of falls. Memory and ability to understand  (cognition). Work and work Statistician. Reproductive health. Screening  You may have the following tests or measurements: Height, weight, and BMI. Blood pressure. Lipid and cholesterol levels. These may be checked every 5 years, or more frequently if you are over 83 years old. Skin check. Lung cancer screening. You may have this screening every year starting at age 67 if you have a 30-pack-year history of smoking and currently smoke or have quit within the past 15 years. Fecal occult blood test (FOBT) of the stool. You may have this test every year starting at age 85. Flexible sigmoidoscopy or colonoscopy. You may have a sigmoidoscopy every 5 years or a colonoscopy every 10 years starting at age 49. Hepatitis C blood test. Hepatitis B blood test. Sexually transmitted disease (STD) testing. Diabetes screening. This is done by checking your blood sugar (glucose) after you have not eaten for a while (fasting). You may have this done every 1-3 years. Bone density scan. This is done to screen for osteoporosis. You may have this done starting at age 86. Mammogram. This may be done every 1-2 years. Talk to your health care provider about how often you should have regular mammograms. Talk with your health care provider about your test results, treatment options, and if necessary, the need for more tests. Vaccines  Your health care provider may recommend certain vaccines, such as: Influenza vaccine. This is recommended every year. Tetanus, diphtheria, and acellular pertussis (Tdap, Td) vaccine. You may need a Td booster every 10 years. Zoster vaccine. You may need this after age 34. Pneumococcal 13-valent conjugate (PCV13) vaccine. One dose is recommended after age 26. Pneumococcal polysaccharide (PPSV23) vaccine. One dose is recommended after age 28. Talk to your health care provider about which screenings and vaccines you need and  how often you need them. This information is not intended to  replace advice given to you by your health care provider. Make sure you discuss any questions you have with your health care provider. Document Released: 09/18/2015 Document Revised: 05/11/2016 Document Reviewed: 06/23/2015 Elsevier Interactive Patient Education  2017 [place] Prevention in the Home Falls can cause injuries. They can happen to people of all ages. There are many things you can do to make your home safe and to help prevent falls. What can I do on the outside of my home? Regularly fix the edges of walkways and driveways and fix any cracks. Remove anything that might make you trip as you walk through a door, such as a raised step or threshold. Trim any bushes or trees on the path to your home. Use bright outdoor lighting. Clear any walking paths of anything that might make someone trip, such as rocks or tools. Regularly check to see if handrails are loose or broken. Make sure that both sides of any steps have handrails. Any raised decks and porches should have guardrails on the edges. Have any leaves, snow, or ice cleared regularly. Use sand or salt on walking paths during winter. Clean up any spills in your garage right away. This includes oil or grease spills. What can I do in the bathroom? Use night lights. Install grab bars by the toilet and in the tub and shower. Do not use towel bars as grab bars. Use non-skid mats or decals in the tub or shower. If you need to sit down in the shower, use a plastic, non-slip stool. Keep the floor dry. Clean up any water that spills on the floor as soon as it happens. Remove soap buildup in the tub or shower regularly. Attach bath mats securely with double-sided non-slip rug tape. Do not have throw rugs and other things on the floor that can make you trip. What can I do in the bedroom? Use night lights. Make sure that you have a light by your bed that is easy to reach. Do not use any sheets or blankets that are too big for  your bed. They should not hang down onto the floor. Have a firm chair that has side arms. You can use this for support while you get dressed. Do not have throw rugs and other things on the floor that can make you trip. What can I do in the kitchen? Clean up any spills right away. Avoid walking on wet floors. Keep items that you use a lot in easy-to-reach places. If you need to reach something above you, use a strong step stool that has a grab bar. Keep electrical cords out of the way. Do not use floor polish or wax that makes floors slippery. If you must use wax, use non-skid floor wax. Do not have throw rugs and other things on the floor that can make you trip. What can I do with my stairs? Do not leave any items on the stairs. Make sure that there are handrails on both sides of the stairs and use them. Fix handrails that are broken or loose. Make sure that handrails are as long as the stairways. Check any carpeting to make sure that it is firmly attached to the stairs. Fix any carpet that is loose or worn. Avoid having throw rugs at the top or bottom of the stairs. If you do have throw rugs, attach them to the floor with carpet tape. Make sure that you have  a light switch at the top of the stairs and the bottom of the stairs. If you do not have them, ask someone to add them for you. What else can I do to help prevent falls? Wear shoes that: Do not have high heels. Have rubber bottoms. Are comfortable and fit you well. Are closed at the toe. Do not wear sandals. If you use a stepladder: Make sure that it is fully opened. Do not climb a closed stepladder. Make sure that both sides of the stepladder are locked into place. Ask someone to hold it for you, if possible. Clearly mark and make sure that you can see: Any grab bars or handrails. First and last steps. Where the edge of each step is. Use tools that help you move around (mobility aids) if they are needed. These  include: Canes. Walkers. Scooters. Crutches. Turn on the lights when you go into a dark area. Replace any light bulbs as soon as they burn out. Set up your furniture so you have a clear path. Avoid moving your furniture around. If any of your floors are uneven, fix them. If there are any pets around you, be aware of where they are. Review your medicines with your doctor. Some medicines can make you feel dizzy. This can increase your chance of falling. Ask your doctor what other things that you can do to help prevent falls. This information is not intended to replace advice given to you by your health care provider. Make sure you discuss any questions you have with your health care provider. Document Released: 06/18/2009 Document Revised: 01/28/2016 Document Reviewed: 09/26/2014 Elsevier Interactive Patient Education  2017 [employer].

## 2022-09-27 ENCOUNTER — Telehealth: Payer: Medicare Other | Admitting: *Deleted

## 2022-09-27 NOTE — Telephone Encounter (Signed)
FL 2 completed.Please add Memantine 5 mg Bid and Seroquel 12.5 daily as recently ordered by Neurologist Dr.Vipol shah then notify POA to pick up F L 2 form

## 2022-09-27 NOTE — Telephone Encounter (Signed)
FL2 Form dropped off by daughter, Emily Dawson [phone] along with a note to Emily Dawson stating the need and medications.   The Medication list they dropped off does not match the current medication list that we have  Current List from the DAUGHTER: 1 Memantine 5 mg twice daily.  2 Metoprolol '100mg'$  daily 3 Vitamin D 2000units daily 4 Seroquel 12.5 daily (prescribed by Dr. Diamond Nickel with [facility] recently) 5 Cosopt One Drop both eyes twice daily 6 Lantoprost One drop both eye at bedtime 7 Tylenol '500mg'$  One every 8 hours as needed.   Placed form in Emily Dawson's folder to review and to confirm medications. Emily Dawson out of office) Printed Emily Dawson Current medication list and attached for Emily Dawson to review and to confirm medications to update in patient's chart.   Please Advise.

## 2022-09-28 ENCOUNTER — Other Ambulatory Visit: Payer: Self-pay

## 2022-09-28 NOTE — Telephone Encounter (Addendum)
Form completed, copied and placed up front in file cabinet for pick up. I called [name] to inform her that the form is ready for pick up.Form placed for scanning.

## 2022-09-28 NOTE — Telephone Encounter (Signed)
Forwarded message to Evie in Clinical Intake to complete FL2 and to Process the Paperwork Appropriately due to being off site.

## 2022-09-29 ENCOUNTER — Telehealth: Payer: Self-pay | Admitting: Family

## 2022-09-29 NOTE — Telephone Encounter (Signed)
Called [place] at [facility] [phone] and spoke with [place] and she stated that they have already performed the TB testing at the facility and we did not have to do that part.   Paperwork faxed to facility Fax:[phone] Copy sent to scanning.

## 2022-09-29 NOTE — Telephone Encounter (Signed)
[facility] send FL 2 and supplemental paperwork to be completed.Please schedule for TB screening.Quantiferon Gold or TB skin test.

## 2022-10-13 ENCOUNTER — Inpatient Hospital Stay (HOSPITAL_COMMUNITY)
Admission: EM | Admit: 2022-10-13 | Discharge: 2022-10-21 | DRG: 522 | Disposition: A | Discharge status: Home Health | Payer: Medicare Other | Source: Skilled Nursing Facility | Attending: Internal Medicine | Admitting: Internal Medicine

## 2022-10-13 ENCOUNTER — Inpatient Hospital Stay (HOSPITAL_COMMUNITY): Payer: Medicare Other

## 2022-10-13 ENCOUNTER — Encounter (HOSPITAL_COMMUNITY)
Admission: EM | Disposition: A | Discharge status: Home Health | Payer: Self-pay | Source: Skilled Nursing Facility | Attending: Internal Medicine

## 2022-10-13 ENCOUNTER — Other Ambulatory Visit: Payer: Self-pay

## 2022-10-13 ENCOUNTER — Encounter (HOSPITAL_COMMUNITY): Payer: Self-pay | Admitting: Internal Medicine

## 2022-10-13 ENCOUNTER — Encounter (HOSPITAL_COMMUNITY): Payer: Self-pay | Admitting: Anesthesiology

## 2022-10-13 ENCOUNTER — Emergency Department (HOSPITAL_COMMUNITY): Payer: Medicare Other

## 2022-10-13 ENCOUNTER — Inpatient Hospital Stay (HOSPITAL_COMMUNITY): Payer: Medicare Other | Admitting: Anesthesiology

## 2022-10-13 DIAGNOSIS — F0390 Unspecified dementia without behavioral disturbance: Secondary | ICD-10-CM | POA: Diagnosis not present

## 2022-10-13 DIAGNOSIS — R5381 Other malaise: Secondary | ICD-10-CM | POA: Diagnosis present

## 2022-10-13 DIAGNOSIS — Z8249 Family history of ischemic heart disease and other diseases of the circulatory system: Secondary | ICD-10-CM

## 2022-10-13 DIAGNOSIS — W19XXXA Unspecified fall, initial encounter: Secondary | ICD-10-CM | POA: Diagnosis not present

## 2022-10-13 DIAGNOSIS — Y92129 Unspecified place in nursing home as the place of occurrence of the external cause: Secondary | ICD-10-CM | POA: Diagnosis not present

## 2022-10-13 DIAGNOSIS — S51012A Laceration without foreign body of left elbow, initial encounter: Secondary | ICD-10-CM | POA: Diagnosis present

## 2022-10-13 DIAGNOSIS — Z853 Personal history of malignant neoplasm of breast: Secondary | ICD-10-CM | POA: Diagnosis not present

## 2022-10-13 DIAGNOSIS — M1990 Unspecified osteoarthritis, unspecified site: Secondary | ICD-10-CM | POA: Diagnosis present

## 2022-10-13 DIAGNOSIS — S72002A Fracture of unspecified part of neck of left femur, initial encounter for closed fracture: Principal | ICD-10-CM | POA: Diagnosis present

## 2022-10-13 DIAGNOSIS — M545 Low back pain, unspecified: Secondary | ICD-10-CM

## 2022-10-13 DIAGNOSIS — D631 Anemia in chronic kidney disease: Secondary | ICD-10-CM | POA: Diagnosis present

## 2022-10-13 DIAGNOSIS — Z881 Allergy status to other antibiotic agents status: Secondary | ICD-10-CM

## 2022-10-13 DIAGNOSIS — Z1152 Encounter for screening for COVID-19: Secondary | ICD-10-CM | POA: Diagnosis not present

## 2022-10-13 DIAGNOSIS — R799 Abnormal finding of blood chemistry, unspecified: Secondary | ICD-10-CM

## 2022-10-13 DIAGNOSIS — Z9221 Personal history of antineoplastic chemotherapy: Secondary | ICD-10-CM

## 2022-10-13 DIAGNOSIS — R636 Underweight: Secondary | ICD-10-CM | POA: Diagnosis present

## 2022-10-13 DIAGNOSIS — F02818 Dementia in other diseases classified elsewhere, unspecified severity, with other behavioral disturbance: Secondary | ICD-10-CM | POA: Diagnosis present

## 2022-10-13 DIAGNOSIS — Z23 Encounter for immunization: Secondary | ICD-10-CM | POA: Diagnosis present

## 2022-10-13 DIAGNOSIS — Z9013 Acquired absence of bilateral breasts and nipples: Secondary | ICD-10-CM

## 2022-10-13 DIAGNOSIS — R9389 Abnormal findings on diagnostic imaging of other specified body structures: Secondary | ICD-10-CM | POA: Diagnosis not present

## 2022-10-13 DIAGNOSIS — M810 Age-related osteoporosis without current pathological fracture: Secondary | ICD-10-CM | POA: Diagnosis present

## 2022-10-13 DIAGNOSIS — F05 Delirium due to known physiological condition: Secondary | ICD-10-CM | POA: Diagnosis present

## 2022-10-13 DIAGNOSIS — H4020X0 Unspecified primary angle-closure glaucoma, stage unspecified: Secondary | ICD-10-CM | POA: Diagnosis present

## 2022-10-13 DIAGNOSIS — R339 Retention of urine, unspecified: Secondary | ICD-10-CM | POA: Diagnosis present

## 2022-10-13 DIAGNOSIS — R338 Other retention of urine: Secondary | ICD-10-CM | POA: Diagnosis present

## 2022-10-13 DIAGNOSIS — Z751 Person awaiting admission to adequate facility elsewhere: Secondary | ICD-10-CM

## 2022-10-13 DIAGNOSIS — H409 Unspecified glaucoma: Secondary | ICD-10-CM | POA: Diagnosis present

## 2022-10-13 DIAGNOSIS — D696 Thrombocytopenia, unspecified: Secondary | ICD-10-CM | POA: Diagnosis present

## 2022-10-13 DIAGNOSIS — F02811 Dementia in other diseases classified elsewhere, unspecified severity, with agitation: Secondary | ICD-10-CM | POA: Diagnosis present

## 2022-10-13 DIAGNOSIS — N1831 Chronic kidney disease, stage 3a: Secondary | ICD-10-CM | POA: Diagnosis present

## 2022-10-13 DIAGNOSIS — R7309 Other abnormal glucose: Secondary | ICD-10-CM

## 2022-10-13 DIAGNOSIS — I10 Essential (primary) hypertension: Secondary | ICD-10-CM | POA: Diagnosis not present

## 2022-10-13 DIAGNOSIS — Z96642 Presence of left artificial hip joint: Secondary | ICD-10-CM | POA: Diagnosis not present

## 2022-10-13 DIAGNOSIS — R32 Unspecified urinary incontinence: Secondary | ICD-10-CM | POA: Diagnosis present

## 2022-10-13 DIAGNOSIS — W010XXA Fall on same level from slipping, tripping and stumbling without subsequent striking against object, initial encounter: Secondary | ICD-10-CM | POA: Diagnosis present

## 2022-10-13 DIAGNOSIS — D72829 Elevated white blood cell count, unspecified: Secondary | ICD-10-CM | POA: Diagnosis not present

## 2022-10-13 DIAGNOSIS — Z8719 Personal history of other diseases of the digestive system: Secondary | ICD-10-CM

## 2022-10-13 DIAGNOSIS — Z888 Allergy status to other drugs, medicaments and biological substances status: Secondary | ICD-10-CM

## 2022-10-13 DIAGNOSIS — I129 Hypertensive chronic kidney disease with stage 1 through stage 4 chronic kidney disease, or unspecified chronic kidney disease: Secondary | ICD-10-CM | POA: Diagnosis present

## 2022-10-13 DIAGNOSIS — E860 Dehydration: Secondary | ICD-10-CM | POA: Diagnosis not present

## 2022-10-13 DIAGNOSIS — Z880 Allergy status to penicillin: Secondary | ICD-10-CM

## 2022-10-13 DIAGNOSIS — Z79899 Other long term (current) drug therapy: Secondary | ICD-10-CM

## 2022-10-13 DIAGNOSIS — Z90710 Acquired absence of both cervix and uterus: Secondary | ICD-10-CM

## 2022-10-13 DIAGNOSIS — Z809 Family history of malignant neoplasm, unspecified: Secondary | ICD-10-CM

## 2022-10-13 DIAGNOSIS — G309 Alzheimer's disease, unspecified: Secondary | ICD-10-CM | POA: Diagnosis present

## 2022-10-13 DIAGNOSIS — Z681 Body mass index (BMI) 19 or less, adult: Secondary | ICD-10-CM | POA: Diagnosis not present

## 2022-10-13 DIAGNOSIS — Z87440 Personal history of urinary (tract) infections: Secondary | ICD-10-CM

## 2022-10-13 DIAGNOSIS — S51812A Laceration without foreign body of left forearm, initial encounter: Secondary | ICD-10-CM | POA: Diagnosis present

## 2022-10-13 DIAGNOSIS — E876 Hypokalemia: Secondary | ICD-10-CM | POA: Diagnosis present

## 2022-10-13 DIAGNOSIS — R944 Abnormal results of kidney function studies: Secondary | ICD-10-CM | POA: Diagnosis present

## 2022-10-13 DIAGNOSIS — N183 Chronic kidney disease, stage 3 unspecified: Secondary | ICD-10-CM | POA: Diagnosis present

## 2022-10-13 DIAGNOSIS — Z515 Encounter for palliative care: Secondary | ICD-10-CM

## 2022-10-13 DIAGNOSIS — Z66 Do not resuscitate: Secondary | ICD-10-CM | POA: Diagnosis present

## 2022-10-13 HISTORY — PX: TOTAL HIP ARTHROPLASTY: SHX124

## 2022-10-13 LAB — BASIC METABOLIC PANEL
Anion gap: 9 (ref 5–15)
BUN: 37 mg/dL — ABNORMAL HIGH (ref 8–23)
CO2: 24 mmol/L (ref 22–32)
Calcium: 9.3 mg/dL (ref 8.9–10.3)
Chloride: 107 mmol/L (ref 98–111)
Creatinine, Ser: 1 mg/dL (ref 0.44–1.00)
GFR, Estimated: 55 mL/min — ABNORMAL LOW (ref >60)
Glucose, Bld: 138 mg/dL — ABNORMAL HIGH (ref 70–99)
Potassium: 3.7 mmol/L (ref 3.5–5.1)
Sodium: 140 mmol/L (ref 135–145)

## 2022-10-13 LAB — CBC WITH DIFFERENTIAL/PLATELET
Abs Immature Granulocytes: 0.14 10*3/uL — ABNORMAL HIGH (ref 0.00–0.07)
Basophils Absolute: 0.1 10*3/uL (ref 0.0–0.1)
Basophils Relative: 0 %
Eosinophils Absolute: 0 10*3/uL (ref 0.0–0.5)
Eosinophils Relative: 0 %
HCT: 38.8 % (ref 36.0–46.0)
Hemoglobin: 13.5 g/dL (ref 12.0–15.0)
Immature Granulocytes: 1 %
Lymphocytes Relative: 11 %
Lymphs Abs: 1.9 10*3/uL (ref 0.7–4.0)
MCH: 33.2 pg (ref 26.0–34.0)
MCHC: 34.8 g/dL (ref 30.0–36.0)
MCV: 95.3 fL (ref 80.0–100.0)
Monocytes Absolute: 1.3 10*3/uL — ABNORMAL HIGH (ref 0.1–1.0)
Monocytes Relative: 7 %
Neutro Abs: 14.6 10*3/uL — ABNORMAL HIGH (ref 1.7–7.7)
Neutrophils Relative %: 81 %
Platelets: 196 10*3/uL (ref 150–400)
RBC: 4.07 MIL/uL (ref 3.87–5.11)
RDW: 13.2 % (ref 11.5–15.5)
WBC: 18 10*3/uL — ABNORMAL HIGH (ref 4.0–10.5)
nRBC: 0 % (ref 0.0–0.2)

## 2022-10-13 LAB — URINALYSIS, ROUTINE W REFLEX MICROSCOPIC
Bilirubin Urine: NEGATIVE
Glucose, UA: 50 mg/dL — AB
Hgb urine dipstick: NEGATIVE
Ketones, ur: 20 mg/dL — AB
Leukocytes,Ua: NEGATIVE
Nitrite: NEGATIVE
Protein, ur: 30 mg/dL — AB
Specific Gravity, Urine: 1.012 (ref 1.005–1.030)
pH: 7 (ref 5.0–8.0)

## 2022-10-13 LAB — PROCALCITONIN: Procalcitonin: <0.1 ng/mL

## 2022-10-13 LAB — RESP PANEL BY RT-PCR (RSV, FLU A&B, COVID)  RVPGX2
Influenza A by PCR: NEGATIVE
Influenza B by PCR: NEGATIVE
Resp Syncytial Virus by PCR: NEGATIVE
SARS Coronavirus 2 by RT PCR: NEGATIVE

## 2022-10-13 LAB — TYPE AND SCREEN
ABO/RH(D): A POS
Antibody Screen: NEGATIVE

## 2022-10-13 LAB — PROTIME-INR
INR: 1.1 (ref 0.8–1.2)
Prothrombin Time: 14.5 seconds (ref 11.4–15.2)

## 2022-10-13 LAB — STREP PNEUMONIAE URINARY ANTIGEN: Strep Pneumo Urinary Antigen: NEGATIVE

## 2022-10-13 LAB — ABO/RH: ABO/RH(D): A POS

## 2022-10-13 LAB — SURGICAL PCR SCREEN
MRSA, PCR: NEGATIVE
Staphylococcus aureus: NEGATIVE

## 2022-10-13 SURGERY — ARTHROPLASTY, HIP, TOTAL, ANTERIOR APPROACH
Anesthesia: Spinal | Site: Hip | Laterality: Left

## 2022-10-13 MED ORDER — TETANUS-DIPHTH-ACELL PERTUSSIS 5-2.5-18.5 LF-MCG/0.5 IM SUSY
0.5000 mL | PREFILLED_SYRINGE | Freq: Once | INTRAMUSCULAR | Status: AC
  Administered 2022-10-13: 0.5 mL via INTRAMUSCULAR
  Filled 2022-10-13: qty 0.5

## 2022-10-13 MED ORDER — METOCLOPRAMIDE HCL 5 MG PO TABS: 5.0000 mg | ORAL_TABLET | Freq: Three times a day (TID) | ORAL | Status: DC | PRN

## 2022-10-13 MED ORDER — TRANEXAMIC ACID-NACL 1000-0.7 MG/100ML-% IV SOLN
1000.0000 mg | Freq: Once | INTRAVENOUS | Status: AC
  Administered 2022-10-13: 1000 mg via INTRAVENOUS
  Filled 2022-10-13: qty 100

## 2022-10-13 MED ORDER — METHOCARBAMOL 1000 MG/10ML IJ SOLN: 500.0000 mg | Freq: Four times a day (QID) | INTRAVENOUS | Status: DC | PRN

## 2022-10-13 MED ORDER — CEFAZOLIN SODIUM-DEXTROSE 2-4 GM/100ML-% IV SOLN
2.0000 g | Freq: Four times a day (QID) | INTRAVENOUS | Status: AC
  Administered 2022-10-13 (×2): 2 g via INTRAVENOUS
  Filled 2022-10-13 (×2): qty 100

## 2022-10-13 MED ORDER — ONDANSETRON HCL 4 MG/2ML IJ SOLN
INTRAMUSCULAR | Status: DC | PRN
  Administered 2022-10-13: 4 mg via INTRAVENOUS

## 2022-10-13 MED ORDER — CEFAZOLIN SODIUM-DEXTROSE 2-4 GM/100ML-% IV SOLN
2.0000 g | INTRAVENOUS | Status: AC
  Administered 2022-10-13: 2 g via INTRAVENOUS
  Filled 2022-10-13: qty 100

## 2022-10-13 MED ORDER — METHOCARBAMOL 500 MG PO TABS
500.0000 mg | ORAL_TABLET | Freq: Four times a day (QID) | ORAL | Status: DC | PRN
  Administered 2022-10-18: 500 mg via ORAL
  Filled 2022-10-13: qty 1

## 2022-10-13 MED ORDER — SODIUM CHLORIDE 0.9 % IR SOLN
Status: DC | PRN
  Administered 2022-10-13: 1000 mL

## 2022-10-13 MED ORDER — BISACODYL 10 MG RE SUPP: 10.0000 mg | Freq: Every day | RECTAL | Status: DC | PRN

## 2022-10-13 MED ORDER — CHLORHEXIDINE GLUCONATE 0.12 % MT SOLN
OROMUCOSAL | Status: AC
  Filled 2022-10-13: qty 15

## 2022-10-13 MED ORDER — ACETAMINOPHEN 650 MG RE SUPP: 650.0000 mg | Freq: Four times a day (QID) | RECTAL | Status: DC | PRN

## 2022-10-13 MED ORDER — MORPHINE SULFATE (PF) 2 MG/ML IV SOLN
0.5000 mg | INTRAVENOUS | Status: DC | PRN
  Administered 2022-10-14 – 2022-10-16 (×5): 1 mg via INTRAVENOUS
  Filled 2022-10-13 (×6): qty 1

## 2022-10-13 MED ORDER — HYDROCODONE-ACETAMINOPHEN 5-325 MG PO TABS: 1.0000 | ORAL_TABLET | Freq: Four times a day (QID) | ORAL | Status: DC | PRN

## 2022-10-13 MED ORDER — FENTANYL CITRATE (PF) 250 MCG/5ML IJ SOLN
INTRAMUSCULAR | Status: AC
  Filled 2022-10-13: qty 5

## 2022-10-13 MED ORDER — ONDANSETRON HCL 4 MG/2ML IJ SOLN: 4.0000 mg | Freq: Four times a day (QID) | INTRAMUSCULAR | Status: DC | PRN

## 2022-10-13 MED ORDER — ACETAMINOPHEN 10 MG/ML IV SOLN
INTRAVENOUS | Status: AC
  Filled 2022-10-13: qty 100

## 2022-10-13 MED ORDER — POVIDONE-IODINE 10 % EX SWAB
2.0000 | Freq: Once | CUTANEOUS | Status: AC
  Administered 2022-10-13: 2 via TOPICAL

## 2022-10-13 MED ORDER — FENTANYL CITRATE (PF) 100 MCG/2ML IJ SOLN: 25.0000 ug | INTRAMUSCULAR | Status: DC | PRN

## 2022-10-13 MED ORDER — PHENYLEPHRINE HCL-NACL 20-0.9 MG/250ML-% IV SOLN
INTRAVENOUS | Status: DC | PRN
  Administered 2022-10-13: 50 ug/min via INTRAVENOUS

## 2022-10-13 MED ORDER — ENOXAPARIN SODIUM 40 MG/0.4ML IJ SOSY: 40.0000 mg | PREFILLED_SYRINGE | INTRAMUSCULAR | Status: DC

## 2022-10-13 MED ORDER — DOCUSATE SODIUM 100 MG PO CAPS
100.0000 mg | ORAL_CAPSULE | Freq: Two times a day (BID) | ORAL | Status: DC
  Administered 2022-10-13 – 2022-10-20 (×13): 100 mg via ORAL
  Filled 2022-10-13 (×16): qty 1

## 2022-10-13 MED ORDER — PROPOFOL 10 MG/ML IV BOLUS
INTRAVENOUS | Status: DC | PRN
  Administered 2022-10-13: 10 mg via INTRAVENOUS
  Administered 2022-10-13: 20 mg via INTRAVENOUS
  Administered 2022-10-13 (×2): 10 mg via INTRAVENOUS

## 2022-10-13 MED ORDER — BUPIVACAINE IN DEXTROSE 0.75-8.25 % IT SOLN
INTRATHECAL | Status: DC | PRN
  Administered 2022-10-13: 1.6 mL via INTRATHECAL

## 2022-10-13 MED ORDER — LACTATED RINGERS IV SOLN: INTRAVENOUS | Status: AC

## 2022-10-13 MED ORDER — MORPHINE SULFATE (PF) 2 MG/ML IV SOLN: 0.5000 mg | INTRAVENOUS | Status: DC | PRN

## 2022-10-13 MED ORDER — PROPOFOL 10 MG/ML IV BOLUS
INTRAVENOUS | Status: AC
  Filled 2022-10-13: qty 20

## 2022-10-13 MED ORDER — PHENOL 1.4 % MT LIQD: 1.0000 | OROMUCOSAL | Status: DC | PRN

## 2022-10-13 MED ORDER — SENNOSIDES-DOCUSATE SODIUM 8.6-50 MG PO TABS
1.0000 | ORAL_TABLET | Freq: Every day | ORAL | Status: DC
  Administered 2022-10-13 – 2022-10-20 (×7): 1 via ORAL
  Filled 2022-10-13 (×8): qty 1

## 2022-10-13 MED ORDER — TRANEXAMIC ACID-NACL 1000-0.7 MG/100ML-% IV SOLN
1000.0000 mg | INTRAVENOUS | Status: AC
  Administered 2022-10-13: 1000 mg via INTRAVENOUS
  Filled 2022-10-13: qty 100

## 2022-10-13 MED ORDER — HYDROCODONE-ACETAMINOPHEN 5-325 MG PO TABS: 1.0000 | ORAL_TABLET | ORAL | Status: DC | PRN

## 2022-10-13 MED ORDER — ACETAMINOPHEN 325 MG PO TABS
650.0000 mg | ORAL_TABLET | Freq: Four times a day (QID) | ORAL | Status: DC | PRN
  Administered 2022-10-15 – 2022-10-18 (×2): 650 mg via ORAL
  Filled 2022-10-13 (×2): qty 2

## 2022-10-13 MED ORDER — MORPHINE SULFATE (PF) 4 MG/ML IV SOLN: 4.0000 mg | INTRAVENOUS | Status: DC | PRN

## 2022-10-13 MED ORDER — FENTANYL CITRATE (PF) 250 MCG/5ML IJ SOLN
INTRAMUSCULAR | Status: DC | PRN
  Administered 2022-10-13 (×2): 50 ug via INTRAVENOUS
  Administered 2022-10-13: 25 ug via INTRAVENOUS

## 2022-10-13 MED ORDER — HYDRALAZINE HCL 20 MG/ML IJ SOLN
10.0000 mg | INTRAMUSCULAR | Status: DC | PRN
  Administered 2022-10-14: 10 mg via INTRAVENOUS
  Filled 2022-10-13: qty 1

## 2022-10-13 MED ORDER — DIPHENHYDRAMINE HCL 12.5 MG/5ML PO ELIX: 12.5000 mg | ORAL_SOLUTION | ORAL | Status: DC | PRN

## 2022-10-13 MED ORDER — PROPOFOL 500 MG/50ML IV EMUL
INTRAVENOUS | Status: DC | PRN
  Administered 2022-10-13: 50 ug/kg/min via INTRAVENOUS

## 2022-10-13 MED ORDER — CHLORHEXIDINE GLUCONATE 4 % EX LIQD: 60.0000 mL | Freq: Once | CUTANEOUS | Status: DC

## 2022-10-13 MED ORDER — MUPIROCIN 2 % EX OINT
1.0000 | TOPICAL_OINTMENT | Freq: Two times a day (BID) | CUTANEOUS | Status: DC
  Administered 2022-10-13: 1 via NASAL
  Filled 2022-10-13: qty 22

## 2022-10-13 MED ORDER — SODIUM CHLORIDE 0.9 % IV SOLN: INTRAVENOUS | Status: DC

## 2022-10-13 MED ORDER — ACETAMINOPHEN 325 MG PO TABS: 325.0000 mg | ORAL_TABLET | Freq: Four times a day (QID) | ORAL | Status: DC | PRN

## 2022-10-13 MED ORDER — MENTHOL 3 MG MT LOZG: 1.0000 | LOZENGE | OROMUCOSAL | Status: DC | PRN

## 2022-10-13 MED ORDER — METOPROLOL SUCCINATE ER 25 MG PO TB24: 100.0000 mg | ORAL_TABLET | Freq: Every day | ORAL | Status: DC

## 2022-10-13 MED ORDER — METOPROLOL SUCCINATE ER 25 MG PO TB24
ORAL_TABLET | ORAL | Status: AC
  Administered 2022-10-13: 62.5 mg via ORAL
  Filled 2022-10-13: qty 4

## 2022-10-13 MED ORDER — ASPIRIN 81 MG PO CHEW
81.0000 mg | CHEWABLE_TABLET | Freq: Two times a day (BID) | ORAL | Status: DC
  Administered 2022-10-13 – 2022-10-21 (×14): 81 mg via ORAL
  Filled 2022-10-13 (×16): qty 1

## 2022-10-13 MED ORDER — POLYETHYLENE GLYCOL 3350 17 G PO PACK
17.0000 g | PACK | Freq: Two times a day (BID) | ORAL | Status: DC
  Administered 2022-10-15 – 2022-10-21 (×13): 17 g via ORAL
  Filled 2022-10-13 (×13): qty 1

## 2022-10-13 MED ORDER — METOCLOPRAMIDE HCL 5 MG/ML IJ SOLN: 5.0000 mg | Freq: Three times a day (TID) | INTRAMUSCULAR | Status: DC | PRN

## 2022-10-13 MED ORDER — ONDANSETRON HCL 4 MG PO TABS: 4.0000 mg | ORAL_TABLET | Freq: Four times a day (QID) | ORAL | Status: DC | PRN

## 2022-10-13 MED ORDER — DEXAMETHASONE SODIUM PHOSPHATE 10 MG/ML IJ SOLN
10.0000 mg | Freq: Once | INTRAMUSCULAR | Status: AC
  Administered 2022-10-14: 10 mg via INTRAVENOUS
  Filled 2022-10-13: qty 1

## 2022-10-13 MED ORDER — CHLORHEXIDINE GLUCONATE CLOTH 2 % EX PADS
6.0000 | MEDICATED_PAD | Freq: Every day | CUTANEOUS | Status: DC
  Administered 2022-10-13 – 2022-10-18 (×4): 6 via TOPICAL

## 2022-10-13 MED ORDER — ACETAMINOPHEN 10 MG/ML IV SOLN
INTRAVENOUS | Status: DC | PRN
  Administered 2022-10-13: 600 mg via INTRAVENOUS

## 2022-10-13 MED ORDER — LACTATED RINGERS IV SOLN
INTRAVENOUS | Status: DC
  Administered 2022-10-13: 1000 mL via INTRAVENOUS

## 2022-10-13 SURGICAL SUPPLY — 64 items
BAG COUNTER SPONGE SURGICOUNT (BAG) ×1 IMPLANT
BLADE CLIPPER SURG (BLADE) IMPLANT
BLADE SAW SGTL 18X1.27X75 (BLADE) ×1 IMPLANT
CELLS DAT CNTRL 66122 CELL SVR (MISCELLANEOUS) ×1 IMPLANT
COVER BACK TABLE 24X17X13 BIG (DRAPES) IMPLANT
COVER SURGICAL LIGHT HANDLE (MISCELLANEOUS) ×1 IMPLANT
CUP ACET PINNACLE SECTR 50MM (Hips) IMPLANT
DERMABOND ADVANCED .7 DNX12 (GAUZE/BANDAGES/DRESSINGS) ×1 IMPLANT
DRAPE C-ARM 42X72 X-RAY (DRAPES) ×1 IMPLANT
DRAPE IMP U-DRAPE 54X76 (DRAPES) ×1 IMPLANT
DRAPE STERI IOBAN 125X83 (DRAPES) ×1 IMPLANT
DRAPE U-SHAPE 47X51 STRL (DRAPES) ×3 IMPLANT
DRSG AQUACEL AG ADV 3.5X10 (GAUZE/BANDAGES/DRESSINGS) ×1 IMPLANT
DRSG TEGADERM 4X4.75 (GAUZE/BANDAGES/DRESSINGS) ×1 IMPLANT
DURAPREP 26ML APPLICATOR (WOUND CARE) ×1 IMPLANT
ELECT BLADE TIP CTD 4 INCH (ELECTRODE) IMPLANT
ELECT REM PT RETURN 9FT ADLT (ELECTROSURGICAL) ×1
ELECTRODE REM PT RTRN 9FT ADLT (ELECTROSURGICAL) ×1 IMPLANT
EVACUATOR 1/8 PVC DRAIN (DRAIN) ×1 IMPLANT
FACESHIELD WRAPAROUND (MASK) ×1 IMPLANT
FACESHIELD WRAPAROUND OR TEAM (MASK) ×1 IMPLANT
GAUZE SPONGE 2X2 8PLY STRL LF (GAUZE/BANDAGES/DRESSINGS) ×1 IMPLANT
GLOVE BIOGEL PI IND STRL 7.5 (GLOVE) ×3 IMPLANT
GLOVE BIOGEL PI IND STRL 8 (GLOVE) ×1 IMPLANT
GLOVE ECLIPSE 8.0 STRL XLNG CF (GLOVE) ×2 IMPLANT
GLOVE ORTHO TXT STRL SZ7.5 (GLOVE) ×2 IMPLANT
GLOVE SURG ENC MOIS LTX SZ6 (GLOVE) ×1 IMPLANT
GLOVE SURG UNDER POLY LF SZ6.5 (GLOVE) ×2 IMPLANT
GOWN BRE IMP SLV AUR LG STRL (GOWN DISPOSABLE) ×1 IMPLANT
GOWN STRL REIN 3XL XLG LVL4 (GOWN DISPOSABLE) ×1 IMPLANT
GOWN STRL REUS W/ TWL LRG LVL3 (GOWN DISPOSABLE) ×2 IMPLANT
GOWN STRL REUS W/TWL LRG LVL3 (GOWN DISPOSABLE) ×2
HEAD FEM STD 32X+5 STRL (Hips) IMPLANT
KIT BASIN OR (CUSTOM PROCEDURE TRAY) ×1 IMPLANT
KIT TURNOVER KIT B (KITS) ×1 IMPLANT
LINER ACET PNNCL PLUS4 NEUTRAL (Hips) IMPLANT
MANIFOLD NEPTUNE II (INSTRUMENTS) ×1 IMPLANT
NS IRRIG 1000ML POUR BTL (IV SOLUTION) ×1 IMPLANT
PACK TOTAL JOINT (CUSTOM PROCEDURE TRAY) ×1 IMPLANT
PACK UNIVERSAL I (CUSTOM PROCEDURE TRAY) ×1 IMPLANT
PAD ARMBOARD 7.5X6 YLW CONV (MISCELLANEOUS) ×2 IMPLANT
PINNACLE PLUS 4 NEUTRAL (Hips) ×1 IMPLANT
PINNACLE SECTOR CUP 50MM (Hips) ×1 IMPLANT
RESTRAINT LIMB HOLDER UNIV (RESTRAINTS) ×1 IMPLANT
RETRACTOR WND ALEXIS 18 MED (MISCELLANEOUS) ×1 IMPLANT
RTRCTR WOUND ALEXIS 18CM MED (MISCELLANEOUS) ×1
SCREW 6.5MMX25MM (Screw) IMPLANT
SPONGE T-LAP 4X18 ~~LOC~~+RFID (SPONGE) IMPLANT
STAPLER VISISTAT 35W (STAPLE) ×1 IMPLANT
STEM FEMORAL SZ5 HIGH ACTIS (Stem) IMPLANT
SUCTION FRAZIER HANDLE 10FR (MISCELLANEOUS) ×1
SUCTION TUBE FRAZIER 10FR DISP (MISCELLANEOUS) ×1 IMPLANT
SUT MNCRL AB 4-0 PS2 18 (SUTURE) ×1 IMPLANT
SUT VIC AB 1 CT1 27 (SUTURE) ×4
SUT VIC AB 1 CT1 27XBRD ANBCTR (SUTURE) ×4 IMPLANT
SUT VIC AB 2-0 CT1 27 (SUTURE) ×2
SUT VIC AB 2-0 CT1 TAPERPNT 27 (SUTURE) ×2 IMPLANT
SUT VLOC 180 0 24IN GS25 (SUTURE) ×1 IMPLANT
TOWEL GREEN STERILE (TOWEL DISPOSABLE) ×1 IMPLANT
TOWEL GREEN STERILE FF (TOWEL DISPOSABLE) ×1 IMPLANT
TRAY CATH 16FR W/PLASTIC CATH (SET/KITS/TRAYS/PACK) IMPLANT
TRAY FOLEY MTR SLVR 16FR STAT (SET/KITS/TRAYS/PACK) IMPLANT
TUBE SUCT ARGYLE STRL (TUBING) IMPLANT
WATER STERILE IRR 1000ML POUR (IV SOLUTION) ×3 IMPLANT

## 2022-10-13 NOTE — Progress Notes (Signed)
  Carryover admission to the Day Admitter.  I discussed this case with the EDP, Dr. Roxanne Mins.  Per these discussions:   This is a 89 year old female who is being admitted will acute left femoral neck fracture after reported fall and without any reported associated loss of consciousness.  Not on any blood thinners.   EDP discussed w/ on-call ortho (Dr. Guy Sandifer) who will formally consult and see patient this AM. Dr. Guy Sandifer requests that patient be kept npo in the meantime.   I have placed an order for inpatient admit to med-surg for further evaluation/management of the above.  I have placed some additional preliminary admit orders via the adult multi-morbid admission order set. I have also ordered NPO, and maintained existing order for prn iv morphine. Added prn iv zofran. I ordered dnr status in the setting of active MOST form on file stipulating DNR status.     Babs Bertin, DO Hospitalist

## 2022-10-13 NOTE — Progress Notes (Signed)
Patient ID: Emily Dawson, female   DOB: 1933-09-09, 89 y.o.   MRN: [id number]  Left hip femoral neck fracture Full note to follow Plan will require operative intervention Timing TBD

## 2022-10-13 NOTE — Plan of Care (Signed)

## 2022-10-13 NOTE — Transfer of Care (Signed)
Immediate Anesthesia Transfer of Care Note  Patient: Emily Dawson  Procedure(s) Performed: LEFT TOTAL HIP ARTHROPLASTY ANTERIOR APPROACH (Left: Hip)  Patient Location: PACU  Anesthesia Type:MAC and Spinal  Level of Consciousness: drowsy and responds to stimulation  Airway & Oxygen Therapy: Patient Spontanous Breathing and Patient connected to face mask oxygen  Post-op Assessment: Report given to RN and Post -op Vital signs reviewed and stable  Post vital signs: Reviewed and stable  Last Vitals:  Vitals Value Taken Time  BP 103/67 10/13/22 1348  Temp    Pulse 50 10/13/22 1354  Resp 11 10/13/22 1354  SpO2 100 % 10/13/22 1354  Vitals shown include unvalidated device data.  Last Pain:  Vitals:   10/13/22 1026  TempSrc: Oral         Complications: No notable events documented.

## 2022-10-13 NOTE — Consult Note (Signed)
Reason for Consult:Left hip fx Referring Physician: Fuller Plan Time called: 0240 Time at bedside: Emily Dawson is an 89 y.o. female.  HPI: Emily [facility] the [facility]. She was brought to the ED where x-rays showed a left hip fx and orthopedic surgery was consulted. She is demented and cannot contribute to history.  Past Medical History:  Diagnosis Date   Abnormal thyroid blood test 11/04/2010   Dr.Berglind's lab results indicated TSH was low, Per records provided by patient at new patient appointment    Alzheimer disease [facility])    Per [facility] New Patient packet   Angle-closure glaucoma    Per records provided by patient at new patient appointment    Arthritis    Per [facility] New Patient packet   Breast cancer ([facility])    Chemo 04/2001-07/2001, Per [facility] New Patient packet   Chronic kidney disease    Per [facility] New Patient packet   Chronic UTI    Per [facility] New Patient packet   Colon polyps    Per [facility] New Patient packet   Dementia [facility])    Per [facility] New Patient packet   Eczema    Per [facility] New Patient packet   Glaucoma    Per [facility] New Patient packet   H/O angiography 02/2019   Per [place] Patient Packet   H/O CT scan of head 01/2020   Per [place] New Patient Packet   H/O cystoscopy 03/12/2014   Dr.Kelly Venia Minks, Per [facility] New Patient packet   H/O laser iridotomy 06/2015   Per [facility] New Patient Packet   Hematuria    Per [facility] New Patient packet   Henoch-Schonlein purpura [facility])    Dr.Heher, Per [facility] New Patient packet   Hives    Per records provided by patient at new patient appointment    Hypertension    Per [facility] New Patient packet   Low TSH level    Memory loss    Over the past 6 years (as of 2021), Per [facility] New Patient packet   Osteoporosis    Per [facility] New Patient packet   Retinal vein occlusion of left eye    Per [facility] New Patient packet   Thyroid disease    Per [facility] New Patient packet   Vitamin D deficiency    Per [facility] New Patient packet    Past Surgical History:   Procedure Laterality Date   ABDOMINAL HYSTERECTOMY     Fibroid tumor. Patient with ovaries. Per [facility] New Patient packet   BIOPSY THYROID     Dr.Rahmani, Per [facility] New Patient packet   BREAST BIOPSY  02/1987   Per [facility] New Patient Packet   BREAST BIOPSY  01/2001   Per [place] New Patient packet   COLONOSCOPY  05/17/2017   Per [facility] New Patient packet, performed by Dr. Steffanie Dunn at [facility]. Polyp was removed.   MASTECTOMY  02/2001   Per [facility] New Patient packet   MASTECTOMY MODIFIED RADICAL  03/1987   Per [facility] New Patient packet   RENAL BIOPSY  2003   Done in [place]. Per records provided by patient at new patient appointment    [place]   Per [facility] Patient packet    Family History  Problem Relation Age of Onset   Cancer Mother    Heart disease Father    Hypertension Son    Hypertension Daughter     Social History:  reports that she has never  smoked. She has never used smokeless tobacco. She reports that she does not drink alcohol and does not use drugs.  Allergies:  Allergies  Allergen Reactions   Nsaids    Ampicillin    Arimidex [Anastrozole] Other (See Comments)    Kidney problems    Azithromycin    Celecoxib    Fosamax [Alendronate] Other (See Comments)    Kidney problem    Ibuprofen    Naproxen    Penicillins    Sulfa Antibiotics    Tetracyclines & Related    Losartan Rash    Medications: I have reviewed the patient's current medications.  Results for orders placed or performed during the hospital encounter of 10/13/22 (from the past 48 hour(s))  Basic metabolic panel     Status: Abnormal   Collection Time: 10/13/22  2:47 AM  Result Value Ref Range   Sodium 140 135 - 145 mmol/L   Potassium 3.7 3.5 - 5.1 mmol/L   Chloride 107 98 - 111 mmol/L   CO2 24 22 - 32 mmol/L   Glucose, Bld 138 (H) 70 - 99 mg/dL    Comment: Glucose reference range applies only to samples taken after fasting for at least 8 hours.   BUN 37 (H) 8 - 23  mg/dL   Creatinine, Ser 1.00 0.44 - 1.00 mg/dL   Calcium 9.3 8.9 - 10.3 mg/dL   GFR, Estimated 55 (L) >60 mL/min    Comment: (NOTE) Calculated using the CKD-EPI Creatinine Equation (2021)    Anion gap 9 5 - 15    Comment: Performed at [facility] [address], [place], [place] [address]  CBC with Differential     Status: Abnormal   Collection Time: 10/13/22  2:47 AM  Result Value Ref Range   WBC 18.0 (H) 4.0 - 10.5 K/uL   RBC 4.07 3.87 - 5.11 MIL/uL   Hemoglobin 13.5 12.0 - 15.0 g/dL   HCT 38.8 36.0 - 46.0 %   MCV 95.3 80.0 - 100.0 fL   MCH 33.2 26.0 - 34.0 pg   MCHC 34.8 30.0 - 36.0 g/dL   RDW 13.2 11.5 - 15.5 %   Platelets 196 150 - 400 K/uL   nRBC 0.0 0.0 - 0.2 %   Neutrophils Relative % 81 %   Neutro Abs 14.6 (H) 1.7 - 7.7 K/uL   Lymphocytes Relative 11 %   Lymphs Abs 1.9 0.7 - 4.0 K/uL   Monocytes Relative 7 %   Monocytes Absolute 1.3 (H) 0.1 - 1.0 K/uL   Eosinophils Relative 0 %   Eosinophils Absolute 0.0 0.0 - 0.5 K/uL   Basophils Relative 0 %   Basophils Absolute 0.1 0.0 - 0.1 K/uL   Immature Granulocytes 1 %   Abs Immature Granulocytes 0.14 (H) 0.00 - 0.07 K/uL    Comment: Performed at [facility] [address] [place], [place] [address]  Protime-INR     Status: None   Collection Time: 10/13/22  2:47 AM  Result Value Ref Range   Prothrombin Time 14.5 11.4 - 15.2 seconds   INR 1.1 0.8 - 1.2    Comment: (NOTE) INR goal varies based on device and disease states. Performed at [facility], [place] [address], [place], [place] [address]   Procalcitonin - Baseline     Status: None   Collection Time: 10/13/22  2:47 AM  Result Value Ref Range   Procalcitonin <0.10 ng/mL    Comment:  Interpretation: PCT (Procalcitonin) <= 0.5 ng/mL: Systemic infection (sepsis) is not likely. Local bacterial infection is possible. (NOTE)       Sepsis PCT Algorithm           Lower Respiratory Tract                                      Infection PCT  Algorithm    ----------------------------     ----------------------------         PCT < 0.25 ng/mL                PCT < 0.10 ng/mL          Strongly encourage             Strongly discourage   discontinuation of antibiotics    initiation of antibiotics    ----------------------------     -----------------------------       PCT 0.25 - 0.50 ng/mL            PCT 0.10 - 0.25 ng/mL               OR       >80% decrease in PCT            Discourage initiation of                                            antibiotics      Encourage discontinuation           of antibiotics    ----------------------------     -----------------------------         PCT >= 0.50 ng/mL              PCT 0.26 - 0.50 ng/mL               AND        <80% decrease in PCT             Encourage initiation of                                             antibiotics       Encourage continuation           of antibiotics    ----------------------------     -----------------------------        PCT >= 0.50 ng/mL                  PCT > 0.50 ng/mL               AND         increase in PCT                  Strongly encourage                                      initiation of antibiotics    Strongly encourage escalation           of antibiotics                                     -----------------------------  PCT <= 0.25 ng/mL                                                 OR                                        > 80% decrease in PCT                                      Discontinue / Do not initiate                                             antibiotics  Performed at [facility], [place] [address] [place], [place] [address]   Type and screen [place]     Status: None   Collection Time: 10/13/22  3:33 AM  Result Value Ref Range   ABO/RH(D) A POS    Antibody Screen NEG    Sample Expiration      10/16/2022,2359 Performed at [facility], [place] [address], [place], [place] [address]   ABO/Rh     Status: None   Collection Time: 10/13/22  3:33 AM  Result Value Ref Range   ABO/RH(D)      A POS Performed at [facility] [address] [place], [place] [address]   Surgical pcr screen     Status: None   Collection Time: 10/13/22  6:17 AM   Specimen: Nasal Mucosa; Nasal Swab  Result Value Ref Range   MRSA, PCR NEGATIVE NEGATIVE   Staphylococcus aureus NEGATIVE NEGATIVE    Comment: (NOTE) The Xpert SA Assay (FDA approved for NASAL specimens in patients 10 years of age and older), is one component of a comprehensive surveillance program. It is not intended to diagnose infection nor to guide or monitor treatment. Performed at [facility], [place] [address] [place], [place] [address]   Urinalysis, Routine w reflex microscopic -Urine, Clean Catch     Status: Abnormal   Collection Time: 10/13/22  8:15 AM  Result Value Ref Range   Color, Urine STRAW (A) YELLOW   APPearance CLEAR CLEAR   Specific Gravity, Urine 1.012 1.005 - 1.030   pH 7.0 5.0 - 8.0   Glucose, UA 50 (A) NEGATIVE mg/dL   Hgb urine dipstick NEGATIVE NEGATIVE   Bilirubin Urine NEGATIVE NEGATIVE   Ketones, ur 20 (A) NEGATIVE mg/dL   Protein, ur 30 (A) NEGATIVE mg/dL   Nitrite NEGATIVE NEGATIVE   Leukocytes,Ua NEGATIVE NEGATIVE   RBC / HPF 0-5 0 - 5 RBC/hpf   WBC, UA 0-5 0 - 5 WBC/hpf   Bacteria, UA RARE (A) NONE SEEN   Squamous Epithelial / HPF 0-5 0 - 5 /HPF    Comment: Performed at [facility], [place] [address], [place], [place] [address]    DG Chest Port 1 View  Result Date: 10/13/2022 CLINICAL DATA:  89 year old female status post fall with left femoral neck fracture. EXAM: PORTABLE CHEST 1 VIEW COMPARISON:  Left hip series  0305 hours today. Cervical spine CT today. FINDINGS: Portable AP supine view at 0402 hours. Lung volumes and cardiac size are at the upper limits of normal. Other mediastinal contours are within normal limits. Visualized  tracheal air column is within normal limits. Right greater than left axillary surgical clips. No pneumothorax, pleural effusion or confluent lung opacity. Diffuse mildly increased and coarse bilateral pulmonary interstitial opacity is nonspecific. Lung apices on cervical spine CT today demonstrated only mild scarring. Oval osseous structures appear intact. IMPRESSION: Nonspecific mildly increased, coarse bilateral pulmonary interstitial opacity with no previous exams. These could be chronic interstitial lung changes. In the appropriate setting of viral/atypical respiratory infection would be difficult to exclude. But otherwise no acute cardiopulmonary abnormality. Electronically Signed   By: Genevie Ann M.D.   On: 10/13/2022 04:16   CT Cervical Spine Wo Contrast  Result Date: 10/13/2022 CLINICAL DATA:  Neck trauma (Age >= 65y).  Unwitnessed fall EXAM: CT CERVICAL SPINE WITHOUT CONTRAST TECHNIQUE: Multidetector CT imaging of the cervical spine was performed without intravenous contrast. Multiplanar CT image reconstructions were also generated. RADIATION DOSE REDUCTION: This exam was performed according to the departmental dose-optimization program which includes automated exposure control, adjustment of the mA and/or kV according to patient size and/or use of iterative reconstruction technique. COMPARISON:  None Available. FINDINGS: Alignment: Normal Skull base and vertebrae: No acute fracture. No primary bone lesion or focal pathologic process. Soft tissues and spinal canal: No prevertebral fluid or swelling. No visible canal hematoma. Disc levels: Degenerative disc disease at C5-6 and C6-7 with disc space narrowing and spurring. Moderate bilateral degenerative facet disease, right greater than left. Fusion across the right C2-3 facet. Upper chest: Biapical scarring.  No acute findings. Other: None IMPRESSION: Degenerative disc and facet disease.  No acute bony abnormality. Electronically Signed   By: Rolm Baptise  M.D.   On: 10/13/2022 03:31   CT Head Wo Contrast  Result Date: 10/13/2022 CLINICAL DATA:  Head trauma, minor (Age >= 65y).  Fall. EXAM: CT HEAD WITHOUT CONTRAST TECHNIQUE: Contiguous axial images were obtained from the base of the skull through the vertex without intravenous contrast. RADIATION DOSE REDUCTION: This exam was performed according to the departmental dose-optimization program which includes automated exposure control, adjustment of the mA and/or kV according to patient size and/or use of iterative reconstruction technique. COMPARISON:  None Available. FINDINGS: Brain: There is atrophy and chronic small vessel disease changes. No acute intracranial abnormality. Specifically, no hemorrhage, hydrocephalus, mass lesion, acute infarction, or significant intracranial injury. Vascular: No hyperdense vessel or unexpected calcification. Skull: No acute calvarial abnormality. Sinuses/Orbits: No acute findings Other: None IMPRESSION: Atrophy, chronic microvascular disease. No acute intracranial abnormality. Electronically Signed   By: Rolm Baptise M.D.   On: 10/13/2022 03:29   DG Hip Unilat W or Wo Pelvis 2-3 Views Left  Result Date: 10/13/2022 CLINICAL DATA:  Unwitnessed fall EXAM: DG HIP (WITH OR WITHOUT PELVIS) 2-3V LEFT COMPARISON:  None Available. FINDINGS: Degenerative changes in the left hip with joint space narrowing and sclerosis and osteophyte formation. Mild deformity of the left hip with cortical irregularity along the femoral neck likely represents an impacted acute fracture. No inter trochanteric involvement is suggested. Pelvis and sacrum appear intact. Surgical clips in the pelvis. IMPRESSION: Nondisplaced impacted fracture of the left femoral neck. Degenerative changes in the left hip. Electronically Signed   By: Lucienne Capers M.D.   On: 10/13/2022 03:24    Review of Systems  Unable to perform ROS: Dementia  Blood pressure (!) 170/89, pulse 97, temperature 97.7 F (36.5 C), resp.  rate 16, SpO2 (!) 88 %. Physical Exam Constitutional:      General: She is not in acute distress.    Appearance: She is well-developed. She is not diaphoretic.  HENT:     Head: Normocephalic and atraumatic.  Eyes:     General: No scleral icterus.       Right eye: No discharge.        Left eye: No discharge.     Conjunctiva/sclera: Conjunctivae normal.  Cardiovascular:     Rate and Rhythm: Normal rate and regular rhythm.  Pulmonary:     Effort: Pulmonary effort is normal. No respiratory distress.  Musculoskeletal:     Cervical back: Normal range of motion.     Comments: LLE No traumatic wounds, ecchymosis, or rash  Nontender  No knee or ankle effusion  Knee stable to varus/ valgus and anterior/posterior stress  Sens DPN, SPN, TN could not assess  Motor EHL, ext, flex, evers could not assess  DP 2+, PT 2+, No significant edema  Skin:    General: Skin is warm and dry.  Neurological:     Mental Status: She is alert.  Psychiatric:        Mood and Affect: Mood normal.        Behavior: Behavior normal.     Assessment/Plan: Left hip fx -- Plan THA today with Dr. Alvan Dame. Please keep NPO. Multiple medical problems including Alzheimer's dementia, hypertension, chronic kidney disease, history of breast cancer s/p bilateral mastectomy, arthritis, and glaucoma -- per primary service    Lisette Abu, PA-C Orthopedic Surgery [phone] 10/13/2022, 9:32 AM

## 2022-10-13 NOTE — Progress Notes (Signed)
Patient was on 2L at the start of shift.  100% via continuous oxygen monitor.  Weaned down to 1L at 19:30pm.  Maintain 99-100% via continuous oxygen monitor.   This RN attempted to wean down to room air at 21:48pm, patient was unable to tolerate. SPO2 decreased to 89% and maintained. RN had to reply 1.5L for O2 Saturation to increase back to 95%. Will maintain on oxygen for the remainder of shift until further evaluation.

## 2022-10-13 NOTE — ED Provider Notes (Signed)
[facility] Provider Note   CSN: [id number] Arrival date & time: 10/13/22  0235     History  Chief Complaint  Patient presents with   Lytle Michaels    Emily Dawson is a 89 y.o. female.  The history is provided by the nursing home and the EMS personnel. The history is limited by the condition of the patient (Dementia).  Fall  She has history of dementia, hypertension, chronic kidney disease, breast cancer and comes in following an unwitnessed fall at the nursing home where she resides.  She was noted to have a skin tear to her left elbow.  Last tetanus immunization is unknown.  No other injury was seen.  Per nursing facility, patient is at her baseline mental status.   Home Medications Prior to Admission medications   Medication Sig Start Date End Date Taking? Authorizing Provider  acetaminophen (TYLENOL) 500 MG tablet Take 1 tablet (500 mg total) by mouth every 8 (eight) hours as needed for moderate pain. 03/16/21   Ngetich, Dinah C, NP  b complex vitamins capsule Take 1 capsule by mouth daily.    [provider]  Carboxymeth-Glyc-Polysorb PF (REFRESH OPTIVE ADVANCED PF) 0.5-1-0.5 % SOLN Apply 1 drop to eye in the morning and at bedtime.    [provider]  dorzolamidel-timolol (COSOPT) 22.3-6.8 MG/ML SOLN ophthalmic solution Place 1 drop into both eyes 2 (two) times daily.    [provider]  latanoprost (XALATAN) 0.005 % ophthalmic solution Place 1 drop into both eyes at bedtime.    [provider]  memantine (NAMENDA) 5 MG tablet Take 5 mg by mouth 2 (two) times daily.    [provider]  metoprolol succinate (TOPROL-XL) 100 MG 24 hr tablet Take 1 tablet (100 mg total) by mouth daily. TAKE WITH OR IMMEDIATELY FOLLOWING A MEAL. 09/24/21   Lauree Chandler, NP  mineral oil-hydrophilic petrolatum (AQUAPHOR) ointment Apply topically as needed for dry skin. Both legs 03/16/21   Ngetich, Dinah C, NP   QUEtiapine Fumarate (SEROQUEL XR PO) Take 12.5 mg by mouth. Prescribed by Dr. Diamond Nickel with [facility]    [provider]  VITAMIN D, CHOLECALCIFEROL, PO Take 2,000 Units by mouth daily. 1 capsule daily    [provider]      Allergies    Nsaids, Ampicillin, Arimidex [anastrozole], Azithromycin, Celecoxib, Fosamax [alendronate], Ibuprofen, Naproxen, Penicillins, Sulfa antibiotics, Tetracyclines & related, and Losartan    Review of Systems   Review of Systems  Unable to perform ROS: Dementia    Physical Exam Updated Vital Signs BP (!) 167/93 (BP Location: Right Arm)   Pulse 75   Temp (!) 97.4 F (36.3 C) (Oral)   Resp 14   LMP  (LMP Unknown)   SpO2 95%  Physical Exam Vitals and nursing note reviewed.   89 year old female, resting comfortably and in no acute distress. Vital signs are significant for elevated blood pressure. Oxygen saturation is 95%, which is normal. Head is normocephalic and atraumatic. PERRLA, EOMI. Oropharynx is clear. Neck is immobilized in a stiff cervical collar and is nontender without adenopathy or JVD. Back is nontender and there is no CVA tenderness. Lungs are clear without rales, wheezes, or rhonchi. Chest is nontender. Heart has regular rate and rhythm without murmur. Abdomen is soft, flat, nontender. Extremities: Skin tear present however left forearm without any swelling or deformity.  There is pain with logrolling of the left leg but I cannot identify any tenderness.  Full range of motion of all other joints without pain. Skin is warm and dry without rash. Neurologic: Awake and alert, oriented to person but not place or time, cranial nerves are intact, moves all extremities equally.  ED Results / Procedures / Treatments   Labs (all labs ordered are listed, but only abnormal results are displayed) Labs Reviewed  BASIC METABOLIC PANEL - Abnormal; Notable for the following components:      Result Value   Glucose, Bld 138 (*)     BUN 37 (*)    GFR, Estimated 55 (*)    All other components within normal limits  CBC WITH DIFFERENTIAL/PLATELET - Abnormal; Notable for the following components:   WBC 18.0 (*)    Neutro Abs 14.6 (*)    Monocytes Absolute 1.3 (*)    Abs Immature Granulocytes 0.14 (*)    All other components within normal limits  PROTIME-INR  TYPE AND SCREEN  ABO/RH    EKG EKG Interpretation  Date/Time:  Thursday October 13 2022 02:53:09 EST Ventricular Rate:  76 PR Interval:    QRS Duration: 85 QT Interval:  422 QTC Calculation: 475 R Axis:   21 Text Interpretation: Sinus rhythm Probable left ventricular hypertrophy Anterior Q waves, possibly due to LVH Minimal ST elevation, inferior leads Inferior Q waves noted No old tracing to compare Confirmed by Delora Fuel ([id number]) on 10/13/2022 4:09:47 AM  Radiology DG Chest Port 1 View  Result Date: 10/13/2022 CLINICAL DATA:  89 year old female status post fall with left femoral neck fracture. EXAM: PORTABLE CHEST 1 VIEW COMPARISON:  Left hip series 0305 hours today. Cervical spine CT today. FINDINGS: Portable AP supine view at 0402 hours. Lung volumes and cardiac size are at the upper limits of normal. Other mediastinal contours are within normal limits. Visualized tracheal air column is within normal limits. Right greater than left axillary surgical clips. No pneumothorax, pleural effusion or confluent lung opacity. Diffuse mildly increased and coarse bilateral pulmonary interstitial opacity is nonspecific. Lung apices on cervical spine CT today demonstrated only mild scarring. Oval osseous structures appear intact. IMPRESSION: Nonspecific mildly increased, coarse bilateral pulmonary interstitial opacity with no previous exams. These could be chronic interstitial lung changes. In the appropriate setting of viral/atypical respiratory infection would be difficult to exclude. But otherwise no acute cardiopulmonary abnormality. Electronically Signed   By: Genevie Ann  M.D.   On: 10/13/2022 04:16   CT Cervical Spine Wo Contrast  Result Date: 10/13/2022 CLINICAL DATA:  Neck trauma (Age >= 65y).  Unwitnessed fall EXAM: CT CERVICAL SPINE WITHOUT CONTRAST TECHNIQUE: Multidetector CT imaging of the cervical spine was performed without intravenous contrast. Multiplanar CT image reconstructions were also generated. RADIATION DOSE REDUCTION: This exam was performed according to the departmental dose-optimization program which includes automated exposure control, adjustment of the mA and/or kV according to patient size and/or use of iterative reconstruction technique. COMPARISON:  None Available. FINDINGS: Alignment: Normal Skull base and vertebrae: No acute fracture. No primary bone lesion or focal pathologic process. Soft tissues and spinal canal: No prevertebral fluid or swelling. No visible canal hematoma. Disc levels: Degenerative disc disease at C5-6 and C6-7 with disc space narrowing and spurring. Moderate bilateral degenerative facet disease, right greater than left. Fusion across the right C2-3 facet. Upper chest: Biapical scarring.  No acute findings. Other: None IMPRESSION: Degenerative disc and facet disease.  No acute bony abnormality. Electronically Signed   By: Rolm Baptise M.D.   On: 10/13/2022 03:31   CT Head Wo Contrast  Result Date: 10/13/2022 CLINICAL DATA:  Head trauma, minor (Age >= 65y).  Fall. EXAM: CT HEAD WITHOUT CONTRAST TECHNIQUE: Contiguous axial images were obtained from the base of the skull through the vertex without intravenous contrast. RADIATION DOSE REDUCTION: This exam was performed according to the departmental dose-optimization program which includes automated exposure control, adjustment of the mA and/or kV according to patient size and/or use of iterative reconstruction technique. COMPARISON:  None Available. FINDINGS: Brain: There is atrophy and chronic small vessel disease changes. No acute intracranial abnormality. Specifically, no  hemorrhage, hydrocephalus, mass lesion, acute infarction, or significant intracranial injury. Vascular: No hyperdense vessel or unexpected calcification. Skull: No acute calvarial abnormality. Sinuses/Orbits: No acute findings Other: None IMPRESSION: Atrophy, chronic microvascular disease. No acute intracranial abnormality. Electronically Signed   By: Rolm Baptise M.D.   On: 10/13/2022 03:29   DG Hip Unilat W or Wo Pelvis 2-3 Views Left  Result Date: 10/13/2022 CLINICAL DATA:  Unwitnessed fall EXAM: DG HIP (WITH OR WITHOUT PELVIS) 2-3V LEFT COMPARISON:  None Available. FINDINGS: Degenerative changes in the left hip with joint space narrowing and sclerosis and osteophyte formation. Mild deformity of the left hip with cortical irregularity along the femoral neck likely represents an impacted acute fracture. No inter trochanteric involvement is suggested. Pelvis and sacrum appear intact. Surgical clips in the pelvis. IMPRESSION: Nondisplaced impacted fracture of the left femoral neck. Degenerative changes in the left hip. Electronically Signed   By: Lucienne Capers M.D.   On: 10/13/2022 03:24    Procedures Procedures  Cardiac monitor shows sinus rhythm, per my interpretation.  Medications Ordered in ED Medications  Tdap (BOOSTRIX) injection 0.5 mL (has no administration in time range)    ED Course/ Medical Decision Making/ A&P                             Medical Decision Making Amount and/or Complexity of Data Reviewed Labs: ordered. Radiology: ordered.  Risk Prescription drug management. Decision regarding hospitalization.   Unwitnessed fall with skin tear to the left forearm, pain with logroll of left leg.  I have ordered CT of head and cervical spine, x-rays of left hip.  I could find no record of tetanus immunization, I have ordered a Tdap booster.  CT of head and cervical spine showed no acute injury.  X-rays of left hip show minimally displaced femoral neck fracture.  I have  independently viewed the images, and agree with the radiologist's interpretation.  I have initiated the ED hip fracture protocol.  Chest x-ray shows mildly increased pulmonary interstitial markings of uncertain clinical significance.  I have independently viewed the image, and agree with radiologist's interpretation.  I have reviewed and interpreted her laboratory tests, my interpretation is moderate leukocytosis which is likely stress related, elevated random glucose, elevated BUN which may indicate some degree of dehydration, blood type A+.  I have discussed case with Dr. Ihor Gully, on-call for orthopedics, who agrees to see the patient to arrange for surgical management.  I have discussed the case with Dr. Velia Meyer, of [facility], who agrees to admit the patient.  Final Clinical Impression(s) / ED Diagnoses Final diagnoses:  Closed fracture of neck of left femur, initial encounter ([place])  Fall at nursing home, initial encounter  Elevated BUN  Elevated random blood glucose level    Rx / DC Orders ED Discharge Orders     None         Delora Fuel, MD  10/13/22 0528  

## 2022-10-13 NOTE — Progress Notes (Signed)
Orthopedic Tech Progress Note Patient Details:  Emily Dawson 02-Sep-1934 [id number]   A trapeze overhead frame is not permitted for this pt due to restrictions for use: pt age >/= 71.  Patient ID: Emily Dawson, female   DOB: 1934-01-05, 89 y.o.   MRN: [id number]  Emily Dawson 10/13/2022, 4:17 PM

## 2022-10-13 NOTE — Anesthesia Procedure Notes (Signed)
Spinal  Patient location during procedure: OR Start time: 10/13/2022 12:30 PM End time: 10/13/2022 12:33 PM Reason for block: surgical anesthesia Staffing Performed: anesthesiologist  Anesthesiologist: Brennan Bailey, MD Performed by: Brennan Bailey, MD Authorized by: Brennan Bailey, MD   Preanesthetic Checklist Completed: patient identified, IV checked, risks and benefits discussed, surgical consent, monitors and equipment checked, pre-op evaluation and timeout performed Spinal Block Patient position: right lateral decubitus Prep: DuraPrep and site prepped and draped Patient monitoring: continuous pulse ox, blood pressure and heart rate Approach: right paramedian Location: L3-4 Injection technique: single-shot Needle Needle type: Whitacre  Needle gauge: 22 G Needle length: 9 cm Assessment Events: CSF return Additional Notes Risks, benefits, and alternative discussed. Patient gave consent to procedure. Prepped and draped in sitting position. Patient sedated but responsive to voice. Clear CSF obtained after one needle pass. Positive terminal aspiration. No pain or paraesthesias with injection. Patient tolerated procedure well. Vital signs stable. Tawny Asal, MD

## 2022-10-13 NOTE — Discharge Instructions (Signed)

## 2022-10-13 NOTE — Anesthesia Postprocedure Evaluation (Signed)
Anesthesia Post Note  Patient: Emily Dawson  Procedure(s) Performed: LEFT TOTAL HIP ARTHROPLASTY ANTERIOR APPROACH (Left: Hip)     Patient location during evaluation: PACU Anesthesia Type: Spinal Level of consciousness: awake and alert and confused Pain management: pain level controlled Vital Signs Assessment: post-procedure vital signs reviewed and stable Respiratory status: spontaneous breathing, nonlabored ventilation and respiratory function stable Cardiovascular status: blood pressure returned to baseline Postop Assessment: no apparent nausea or vomiting, spinal receding, no headache and no backache Anesthetic complications: no   No notable events documented.  Last Vitals:  Vitals:   10/13/22 1415 10/13/22 1430  BP: (!) 144/65 (!) 140/58  Pulse: (!) 52 (!) 50  Resp: 12 13  Temp:    SpO2: 100% 100%    Last Pain:  Vitals:   10/13/22 1348  TempSrc:   PainSc: 0-No pain                 Marthenia Rolling

## 2022-10-13 NOTE — Op Note (Signed)
NAME:  Emily Dawson                ACCOUNT NO.: [id number]      MEDICAL RECORD NO.: [id number]      FACILITY:  [facility]      PHYSICIAN:  Mauri Pole  DATE OF BIRTH:  1933-10-13     DATE OF PROCEDURE:  10/13/2022                                 OPERATIVE REPORT         PREOPERATIVE DIAGNOSIS: Left  hip femoral neck fracture.      POSTOPERATIVE DIAGNOSIS:  Left hip femoral neck fracture.      PROCEDURE:  Left total hip replacement through an anterior approach   utilizing DePuy THR system, component size 50 mm pinnacle cup, a size 32+4 neutral   Altrex liner, a size 5 Hi Actis stem with a 32+5 Articuleze metal head ball.      SURGEON:  Pietro Cassis. Alvan Dame, M.D.      ASSISTANT:  Costella Hatcher, PA-C     ANESTHESIA:  Spinal.      SPECIMENS:  None.      COMPLICATIONS:  None.      BLOOD LOSS:  100 cc     DRAINS:  None.      INDICATION OF THE PROCEDURE:  Emily Dawson is a 89 y.o. female who female with dementia who lives independently with family support.  Independent ambulator.  Unwitnessed fall. Emily Dawson is a 89 y.o. female with medical history significant of Alzheimer's dementia, hypertension, chronic kidney disease, history of breast cancer s/p bilateral mastectomy, arthritis, and glaucoma who presents after having an unwitnessed fall.  History is limited from patient due to dementia and obtained from review of records and talks with her daughter over the phone.  It is not totally clear if the patient hit her head or lost consciousness.  Her daughter states that at baseline she was ambulatory and up 80% of the day walking around without assistance.  Question if she may have tripped over a rug tape placed in her room.  Patient just moved to the memory care unit at [facility] 3 days ago, but previously had been staying in independent living with her husband who is caring for her.  Patient had not been coughing, but did have pneumonia couple years ago for  which her daughter believes her chest x-ray had remained abnormal.   Injury reviewed with her daughter with plans discussed to perform total hip replacement to manage her fracture and reduce risks of persistent pain and need for future surgeries.     PROCEDURE IN DETAIL:  The patient was brought to operative theater.   Once adequate anesthesia, preoperative antibiotics, 2 gm of Ancef, 1 gm of Tranexamic Acid, and 10 mg of Decadron were administered, the patient was positioned supine on the [employer] table.  Once the patient was safely positioned with adequate padding of boney prominences we predraped out the hip, and used fluoroscopy to confirm orientation of the pelvis.      The left hip was then prepped and draped from proximal iliac crest to   mid thigh with a shower curtain technique.      Time-out was performed identifying the patient, planned procedure, and the appropriate extremity.     An incision was then made 2 cm lateral to the  anterior superior iliac spine extending over the orientation of the   tensor fascia lata muscle and sharp dissection was carried down to the   fascia of the muscle.      The fascia was then incised.  The muscle belly was identified and swept   laterally and retractor placed along the superior neck.  Following   cauterization of the circumflex vessels and removing some pericapsular   fat, a second cobra retractor was placed on the inferior neck.  A T-capsulotomy was made along the line of the   superior neck to the trochanteric fossa, then extended proximally and   distally.  Tag sutures were placed and the retractors were then placed   intracapsular.  We then identified the trochanteric fossa and   orientation of my neck cut and then made a neck osteotomy with the femur on traction.  The segmental neck fractured segment and then the femoral head wer removed without difficulty or complication.  Traction was let   off and retractors were placed posterior  and anterior around the   acetabulum.      The labrum and foveal tissue were debrided.  I began reaming with a 49 mm   reamer and reamed up to 49 mm reamer with good bony bed preparation and a 50 mm  cup was chosen.  The final 50 mm Pinnacle cup was then impacted under fluoroscopy to confirm the depth of penetration and orientation with respect to   Abduction and forward flexion.  A screw was placed into the ilium followed by the hole eliminator.  The final   32+4 neutral Altrex liner was impacted with good visualized rim fit.  The cup was positioned anatomically within the acetabular portion of the pelvis.      At this point, the femur was rolled to 100 degrees.  Further capsule was   released off the inferior aspect of the femoral neck.  I then   released the superior capsule proximally.  With the leg in a neutral position the hook was placed laterally   along the femur under the vastus lateralis origin and elevated manually and then held in position using the hook attachment on the bed.  The leg was then extended and adducted with the leg rolled to 100   degrees of external rotation.  Retractors were placed along the medial calcar and posteriorly over the greater trochanter.  Once the proximal femur was fully   exposed, I used a box osteotome to set orientation.  I then began   broaching with the starting chili pepper broach and passed this by hand and then broached up to 5.  With the 5 broach in place I chose a high offset neck and did several trial reductions.  The offset was appropriate, leg lengths   appeared to be equal best matched with the +5 head ball trial confirmed radiographically.   Given these findings, I went ahead and dislocated the hip, repositioned all   retractors and positioned the right hip in the extended and abducted position.  The final 5 Hi Actis stem was   chosen and it was impacted down to the level of neck cut.  Based on this   and the trial reductions, a final 32+5  Articuleze metal head ball was chosen and   impacted onto a clean and dry trunnion, and the hip was reduced.  The   hip had been irrigated throughout the case again at this point.  I did  reapproximate the superior capsular leaflet to the anterior leaflet   using #1 Vicryl.  The fascia of the   tensor fascia lata muscle was then reapproximated using #1 Vicryl and #0 Stratafix sutures.  The   remaining wound was closed with 2-0 Vicryl and running 4-0 Monocryl.   The hip was cleaned, dried, and dressed sterilely using Dermabond and   Aquacel dressing.  The patient was then brought   to recovery room in stable condition tolerating the procedure well.    Costella Hatcher, PA-C was present for the entirety of the case involved from   preoperative positioning, perioperative retractor management, general   facilitation of the case, as well as primary wound closure as assistant.            Pietro Cassis Alvan Dame, M.D.        10/13/2022 1:32 PM

## 2022-10-13 NOTE — ED Notes (Signed)
ED TO INPATIENT HANDOFF REPORT  ED Nurse Name and Phone #: Bonna Gains [phone]  S Name/Age/Gender Emily Dawson 89 y.o. female Room/Bed: 019C/019C  Code Status   Code Status: DNR  Home/SNF/Other Skilled nursing facility Patient oriented to: self Is this baseline? Yes   Triage Complete: Triage complete  Chief Complaint Left displaced femoral neck fracture ([place]) [S72.002A]  Triage Note Pt arrives to ED c/o unwitnessed fall from [facility]. Unknown LOC, skin tear to left elbow noted. Pt in c-collar. Pt at baseline per facility    Allergies Allergies  Allergen Reactions   Nsaids    Ampicillin    Arimidex [Anastrozole] Other (See Comments)    Kidney problems    Azithromycin    Celecoxib    Fosamax [Alendronate] Other (See Comments)    Kidney problem    Ibuprofen    Naproxen    Penicillins    Sulfa Antibiotics    Tetracyclines & Related    Losartan Rash    Level of Care/Admitting Diagnosis ED Disposition     ED Disposition  Admit   Condition  --   [place]: [facility] [[id number]]  Level of Care: Med-Surg [16]  May admit patient to [facility] or [facility] if equivalent level of care is available:: No  Covid Evaluation: Asymptomatic - no recent exposure (last 10 days) testing not required  Diagnosis: Left displaced femoral neck fracture [facility]) [[id number]]  Admitting Physician: Rhetta Mura [[id number]]  Attending Physician: Rhetta Mura [[id number]]  Certification:: I certify this patient will need inpatient services for at least 2 midnights  Estimated Length of Stay: 2          B Medical/Surgery History Past Medical History:  Diagnosis Date   Abnormal thyroid blood test 11/04/2010   Dr.Berglind's lab results indicated TSH was low, Per records provided by patient at new patient appointment    Alzheimer disease [facility])    Per [facility] New Patient packet   Angle-closure glaucoma    Per records provided by patient at  new patient appointment    Arthritis    Per [facility] New Patient packet   Breast cancer ([place])    Chemo 04/2001-07/2001, Per [facility] New Patient packet   Chronic kidney disease    Per [facility] New Patient packet   Chronic UTI    Per [facility] New Patient packet   Colon polyps    Per [facility] New Patient packet   Dementia [facility])    Per [place] Patient packet   Eczema    Per [facility] New Patient packet   Glaucoma    Per [facility] New Patient packet   H/O angiography 02/2019   Per [place] Patient Packet   H/O CT scan of head 01/2020   Per [place] New Patient Packet   H/O cystoscopy 03/12/2014   Dr.Kelly Venia Minks, Per [facility] New Patient packet   H/O laser iridotomy 06/2015   Per [facility] New Patient Packet   Hematuria    Per [facility] New Patient packet   Henoch-Schonlein purpura [facility])    Dr.Heher, Per [facility] New Patient packet   Hives    Per records provided by patient at new patient appointment    Hypertension    Per [facility] New Patient packet   Low TSH level    Memory loss    Over the past 6 years (as of 2021), Per [facility] New Patient packet   Osteoporosis    Per [facility] New Patient packet  Retinal vein occlusion of left eye    Per [facility] New Patient packet   Thyroid disease    Per [facility] New Patient packet   Vitamin D deficiency    Per [place] New Patient packet   Past Surgical History:  Procedure Laterality Date   ABDOMINAL HYSTERECTOMY     Fibroid tumor. Patient with ovaries. Per [facility] New Patient packet   BIOPSY THYROID     Dr.Rahmani, Per [facility] New Patient packet   BREAST BIOPSY  02/1987   Per [facility] New Patient Packet   BREAST BIOPSY  01/2001   Per [place] New Patient packet   COLONOSCOPY  05/17/2017   Per [facility] New Patient packet, performed by Dr. Steffanie Dunn at [facility]. Polyp was removed.   MASTECTOMY  02/2001   Per [facility] New Patient packet   MASTECTOMY MODIFIED RADICAL  03/1987   Per [facility] New Patient packet   RENAL BIOPSY  2003   Done in [place]. Per records provided by patient at new patient appointment     TONSILLECTOMY  1944   Per [facility] New Patient packet     A IV Location/Drains/Wounds Patient Lines/Drains/Airways Status     Active Line/Drains/Airways     Name Placement date Placement time Site Days   Peripheral IV 10/13/22 Anterior;Right Forearm 10/13/22  0248  Forearm  less than 1   External Urinary Catheter 10/13/22  0426  --  less than 1            Intake/Output Last 24 hours No intake or output data in the 24 hours ending 10/13/22 0534  Labs/Imaging Results for orders placed or performed during the hospital encounter of 10/13/22 (from the past 48 hour(s))  Basic metabolic panel     Status: Abnormal   Collection Time: 10/13/22  2:47 AM  Result Value Ref Range   Sodium 140 135 - 145 mmol/L   Potassium 3.7 3.5 - 5.1 mmol/L   Chloride 107 98 - 111 mmol/L   CO2 24 22 - 32 mmol/L   Glucose, Bld 138 (H) 70 - 99 mg/dL    Comment: Glucose reference range applies only to samples taken after fasting for at least 8 hours.   BUN 37 (H) 8 - 23 mg/dL   Creatinine, Ser 1.00 0.44 - 1.00 mg/dL   Calcium 9.3 8.9 - 10.3 mg/dL   GFR, Estimated 55 (L) >60 mL/min    Comment: (NOTE) Calculated using the CKD-EPI Creatinine Equation (2021)    Anion gap 9 5 - 15    Comment: Performed at [facility] [address] [place], [place] [address]  CBC with Differential     Status: Abnormal   Collection Time: 10/13/22  2:47 AM  Result Value Ref Range   WBC 18.0 (H) 4.0 - 10.5 K/uL   RBC 4.07 3.87 - 5.11 MIL/uL   Hemoglobin 13.5 12.0 - 15.0 g/dL   HCT 38.8 36.0 - 46.0 %   MCV 95.3 80.0 - 100.0 fL   MCH 33.2 26.0 - 34.0 pg   MCHC 34.8 30.0 - 36.0 g/dL   RDW 13.2 11.5 - 15.5 %   Platelets 196 150 - 400 K/uL   nRBC 0.0 0.0 - 0.2 %   Neutrophils Relative % 81 %   Neutro Abs 14.6 (H) 1.7 - 7.7 K/uL   Lymphocytes Relative 11 %   Lymphs Abs 1.9 0.7 - 4.0 K/uL   Monocytes Relative 7 %   Monocytes Absolute 1.3 (H) 0.1 - 1.0  K/uL   Eosinophils Relative 0 %   Eosinophils Absolute 0.0 0.0  - 0.5 K/uL   Basophils Relative 0 %   Basophils Absolute 0.1 0.0 - 0.1 K/uL   Immature Granulocytes 1 %   Abs Immature Granulocytes 0.14 (H) 0.00 - 0.07 K/uL    Comment: Performed at [facility] [address], [place], [place] [address]  Protime-INR     Status: None   Collection Time: 10/13/22  2:47 AM  Result Value Ref Range   Prothrombin Time 14.5 11.4 - 15.2 seconds   INR 1.1 0.8 - 1.2    Comment: (NOTE) INR goal varies based on device and disease states. Performed at [facility], [place] [address] [place], [place] [address]   Type and screen [place]     Status: None   Collection Time: 10/13/22  3:33 AM  Result Value Ref Range   ABO/RH(D) A POS    Antibody Screen NEG    Sample Expiration      10/16/2022,2359 Performed at [facility], [place] [address] [place], [place] [address]   ABO/Rh     Status: None   Collection Time: 10/13/22  3:33 AM  Result Value Ref Range   ABO/RH(D)      A POS Performed at [facility] [address] [place], [place] [address]    DG Chest Port 1 View  Result Date: 10/13/2022 CLINICAL DATA:  89 year old female status post fall with left femoral neck fracture. EXAM: PORTABLE CHEST 1 VIEW COMPARISON:  Left hip series 0305 hours today. Cervical spine CT today. FINDINGS: Portable AP supine view at 0402 hours. Lung volumes and cardiac size are at the upper limits of normal. Other mediastinal contours are within normal limits. Visualized tracheal air column is within normal limits. Right greater than left axillary surgical clips. No pneumothorax, pleural effusion or confluent lung opacity. Diffuse mildly increased and coarse bilateral pulmonary interstitial opacity is nonspecific. Lung apices on cervical spine CT today demonstrated only mild scarring. Oval osseous structures appear intact. IMPRESSION: Nonspecific mildly increased, coarse bilateral pulmonary interstitial opacity with no previous exams. These could  be chronic interstitial lung changes. In the appropriate setting of viral/atypical respiratory infection would be difficult to exclude. But otherwise no acute cardiopulmonary abnormality. Electronically Signed   By: Genevie Ann M.D.   On: 10/13/2022 04:16   CT Cervical Spine Wo Contrast  Result Date: 10/13/2022 CLINICAL DATA:  Neck trauma (Age >= 65y).  Unwitnessed fall EXAM: CT CERVICAL SPINE WITHOUT CONTRAST TECHNIQUE: Multidetector CT imaging of the cervical spine was performed without intravenous contrast. Multiplanar CT image reconstructions were also generated. RADIATION DOSE REDUCTION: This exam was performed according to the departmental dose-optimization program which includes automated exposure control, adjustment of the mA and/or kV according to patient size and/or use of iterative reconstruction technique. COMPARISON:  None Available. FINDINGS: Alignment: Normal Skull base and vertebrae: No acute fracture. No primary bone lesion or focal pathologic process. Soft tissues and spinal canal: No prevertebral fluid or swelling. No visible canal hematoma. Disc levels: Degenerative disc disease at C5-6 and C6-7 with disc space narrowing and spurring. Moderate bilateral degenerative facet disease, right greater than left. Fusion across the right C2-3 facet. Upper chest: Biapical scarring.  No acute findings. Other: None IMPRESSION: Degenerative disc and facet disease.  No acute bony abnormality. Electronically Signed   By: Rolm Baptise M.D.   On: 10/13/2022 03:31   CT Head Wo Contrast  Result Date: 10/13/2022 CLINICAL DATA:  Head trauma, minor (Age >= 65y).  Fall. EXAM: CT HEAD WITHOUT CONTRAST TECHNIQUE: Contiguous axial images were obtained from the base of the skull through the vertex without intravenous contrast. RADIATION DOSE REDUCTION: This exam was performed according to the departmental dose-optimization program which includes automated exposure control, adjustment of the mA and/or kV according to  patient size and/or use of iterative reconstruction technique. COMPARISON:  None Available. FINDINGS: Brain: There is atrophy and chronic small vessel disease changes. No acute intracranial abnormality. Specifically, no hemorrhage, hydrocephalus, mass lesion, acute infarction, or significant intracranial injury. Vascular: No hyperdense vessel or unexpected calcification. Skull: No acute calvarial abnormality. Sinuses/Orbits: No acute findings Other: None IMPRESSION: Atrophy, chronic microvascular disease. No acute intracranial abnormality. Electronically Signed   By: Rolm Baptise M.D.   On: 10/13/2022 03:29   DG Hip Unilat W or Wo Pelvis 2-3 Views Left  Result Date: 10/13/2022 CLINICAL DATA:  Unwitnessed fall EXAM: DG HIP (WITH OR WITHOUT PELVIS) 2-3V LEFT COMPARISON:  None Available. FINDINGS: Degenerative changes in the left hip with joint space narrowing and sclerosis and osteophyte formation. Mild deformity of the left hip with cortical irregularity along the femoral neck likely represents an impacted acute fracture. No inter trochanteric involvement is suggested. Pelvis and sacrum appear intact. Surgical clips in the pelvis. IMPRESSION: Nondisplaced impacted fracture of the left femoral neck. Degenerative changes in the left hip. Electronically Signed   By: Lucienne Capers M.D.   On: 10/13/2022 03:24    Pending Labs Unresulted Labs (From admission, onward)    None       Vitals/Pain Today's Vitals   10/13/22 0242 10/13/22 0300 10/13/22 0415 10/13/22 0430  BP: (!) 167/93 (!) 155/80 (!) 150/72 (!) 146/74  Pulse: 75 72 73 74  Resp: '14 17 19 '$ (!) 21  Temp: (!) 97.4 F (36.3 C)  (!) 97.4 F (36.3 C)   TempSrc: Oral     SpO2: 95% 94% 91% 92%    Isolation Precautions No active isolations  Medications Medications  lactated ringers infusion (1,000 mLs Intravenous New Bag/Given 10/13/22 0410)  morphine (PF) 4 MG/ML injection 4 mg (has no administration in time range)  acetaminophen (TYLENOL)  tablet 650 mg (has no administration in time range)    Or  acetaminophen (TYLENOL) suppository 650 mg (has no administration in time range)  ondansetron (ZOFRAN) injection 4 mg (has no administration in time range)  Tdap (BOOSTRIX) injection 0.5 mL (0.5 mLs Intramuscular Given 10/13/22 0258)    Mobility non-ambulatory     Focused Assessments   Pt with left femoral neck fracture, pain with movement and touch   R Recommendations: See Admitting Provider Note  Report given to:   Additional Notes: Pt is a/o x 1, pt has dementia and is at baseline. Pt normally ambulatory with device, pt with purewick in place

## 2022-10-13 NOTE — H&P (Addendum)
History and Physical    Patient: Emily Dawson RJJ:[id number] DOB: 11-08-1933 DOA: 10/13/2022 DOS: the patient was seen and examined on 10/13/2022 PCP: Edmonia Caprio, NP  [facility] coming from[facility]  Chief Complaint:  Chief Complaint  Patient presents with   Fall   HPI: Emily Dawson is a 89 y.o. female with medical history significant of Alzheimer's dementia, hypertension, chronic kidney disease, history of breast cancer s/p bilateral mastectomy, arthritis, and glaucoma who presents after having an unwitnessed fall.  History is limited from patient due to dementia and obtained from review of records and talks with her daughter over the phone.  It is not totally clear if the patient hit her head or lost consciousness.  Her daughter states that at baseline she was ambulatory and up 80% of the day walking around without assistance.  Question if she may have tripped over a rug tape placed in her room.  Patient just moved to the memory care unit at [facility] 3 days ago, but previously had been staying in independent living with her husband who is caring for her.  Patient had not been coughing, but did have pneumonia couple years ago for which her daughter believes her chest x-ray had remained abnormal.  The patient had never smoked cigarettes or drink any significant amount of alcohol.  Patient currently denies any complaints at this time.  In the emergency department patient was noted to be afebrile with blood pressures elevated up to 171/82, and all other vital signs relatively maintained.  Labs significant for WBC 18, BUN 37, and creatinine 1.  X-rays of the hip revealed nondisplaced impacted fracture of the left femoral neck.  CT head and cervical spine noted no acute intercranial or bony abnormality.  Chest x-ray had noted nonspecific mildly increased coarse bilateral pulmonary interstitial opacity with no prior to compare.  Patient has been typed and screened for  possible need of blood products.  Dr. Alvan Dame of orthopedics have been consulted and would arrange for surgical management.  Patient has been given Tdap booster and started on lactated Ringer's at 75 mL/h  Review of Systems: As mentioned in the history of present illness. All other systems reviewed and are negative, but limited due to patient's history of dementia Past Medical History:  Diagnosis Date   Abnormal thyroid blood test 11/04/2010   Dr.Berglind's lab results indicated TSH was low, Per records provided by patient at new patient appointment    Alzheimer disease [facility])    Per [facility] New Patient packet   Angle-closure glaucoma    Per records provided by patient at new patient appointment    Arthritis    Per [facility] New Patient packet   Breast cancer ([place])    Chemo 04/2001-07/2001, Per [facility] New Patient packet   Chronic kidney disease    Per [facility] New Patient packet   Chronic UTI    Per [facility] New Patient packet   Colon polyps    Per [place] Patient packet   [facility])    Per [facility] New Patient packet   Eczema    Per [facility] New Patient packet   Glaucoma    Per [facility] New Patient packet   H/O angiography 02/2019   Per [place] Patient Packet   H/O CT scan of head 01/2020   Per [facility] New Patient Packet   H/O cystoscopy 03/12/2014   Dr.Kelly Venia Minks, Per [facility] New Patient packet   H/O laser iridotomy 06/2015   Per [facility]  New Patient Packet   Hematuria    Per [facility] New Patient packet   Henoch-Schonlein purpura [facility])    Dr.Heher, Per [facility] New Patient packet   Hives    Per records provided by patient at new patient appointment    Hypertension    Per [facility] New Patient packet   Low TSH level    Memory loss    Over the past 6 years (as of 2021), Per [facility] New Patient packet   Osteoporosis    Per [facility] New Patient packet   Retinal vein occlusion of left eye    Per [facility] New Patient packet   Thyroid disease    Per [facility] New Patient packet   Vitamin D deficiency    Per [facility] New Patient packet   Past Surgical  History:  Procedure Laterality Date   ABDOMINAL HYSTERECTOMY     Fibroid tumor. Patient with ovaries. Per [facility] New Patient packet   BIOPSY THYROID     Dr.Rahmani, Per [facility] New Patient packet   BREAST BIOPSY  02/1987   Per [facility] New Patient Packet   BREAST BIOPSY  01/2001   Per [place] New Patient packet   COLONOSCOPY  05/17/2017   Per [facility] New Patient packet, performed by Dr. Steffanie Dunn at [facility]. Polyp was removed.   MASTECTOMY  02/2001   Per [facility] New Patient packet   MASTECTOMY MODIFIED RADICAL  03/1987   Per [facility] New Patient packet   RENAL BIOPSY  2003   Done in [place]. Per records provided by patient at new patient appointment    TONSILLECTOMY  1944   Per [facility] New Patient packet   Social History:  reports that she has never smoked. She has never used smokeless tobacco. She reports that she does not drink alcohol and does not use drugs.  Allergies  Allergen Reactions   Nsaids    Ampicillin    Arimidex [Anastrozole] Other (See Comments)    Kidney problems    Azithromycin    Celecoxib    Fosamax [Alendronate] Other (See Comments)    Kidney problem    Ibuprofen    Naproxen    Penicillins    Sulfa Antibiotics    Tetracyclines & Related    Losartan Rash    Family History  Problem Relation Age of Onset   Cancer Mother    Heart disease Father    Hypertension Son    Hypertension Daughter     Prior to Admission medications   Medication Sig Start Date End Date Taking? Authorizing Provider  acetaminophen (TYLENOL) 500 MG tablet Take 1 tablet (500 mg total) by mouth every 8 (eight) hours as needed for moderate pain. 03/16/21   Ngetich, Dinah C, NP  b complex vitamins capsule Take 1 capsule by mouth daily.    [provider]  Carboxymeth-Glyc-Polysorb PF (REFRESH OPTIVE ADVANCED PF) 0.5-1-0.5 % SOLN Apply 1 drop to eye in the morning and at bedtime.    [provider]  dorzolamidel-timolol (COSOPT) 22.3-6.8 MG/ML SOLN ophthalmic solution  Place 1 drop into both eyes 2 (two) times daily.    [provider]  latanoprost (XALATAN) 0.005 % ophthalmic solution Place 1 drop into both eyes at bedtime.    [provider]  memantine (NAMENDA) 5 MG tablet Take 5 mg by mouth 2 (two) times daily.    [provider]  metoprolol succinate (TOPROL-XL) 100 MG 24 hr tablet Take 1 tablet (100 mg total) by mouth daily. TAKE WITH OR  IMMEDIATELY FOLLOWING A MEAL. 09/24/21   Lauree Chandler, NP  mineral oil-hydrophilic petrolatum (AQUAPHOR) ointment Apply topically as needed for dry skin. Both legs 03/16/21   Ngetich, Dinah C, NP  QUEtiapine Fumarate (SEROQUEL XR PO) Take 12.5 mg by mouth. Prescribed by Dr. Diamond Nickel with [facility]    [provider]  VITAMIN D, CHOLECALCIFEROL, PO Take 2,000 Units by mouth daily. 1 capsule daily    [provider]    Physical Exam: Vitals:   10/13/22 0415 10/13/22 0430 10/13/22 0543 10/13/22 0624  BP: (!) 150/72 (!) 146/74 (!) 145/76 (!) 171/82  Pulse: 73 74 70 78  Resp: 19 (!) '21 14 16  '$ Temp: (!) 97.4 F (36.3 C)  97.7 F (36.5 C) 98.2 F (36.8 C)  TempSrc:   Oral   SpO2: 91% 92% 93% 93%   Exam  Constitutional: Thin frail elderly female who is in no acute distress Eyes: PERRL, lids and conjunctivae normal ENMT: Mucous membranes are moist.  Neck: normal, supple,  Respiratory: clear to auscultation bilaterally with mild crackles noted in the lower lung bases.  Normal respiratory effort. No accessory muscle use.  Cardiovascular: Regular rate and rhythm, no murmurs / rubs / gallops.  No significant lower extremity edema. S/p bilateral mastectomy Abdomen: no tenderness, no masses palpated.  Bowel sounds positive.  Musculoskeletal: no clubbing / cyanosis.  Left leg externally rotated and mildly shortened. Skin: no rashes, lesions, ulcers.  Poor skin turgor. Neurologic: CN 2-12 grossly intact. Sensation intact, DTR normal. Strength 5/5 in all 4.  Psychiatric: Poor  memory.  Alert and oriented only to self able to state first name, but only states last name is [place] when asked.  Data Reviewed:  EKG reveals sinus rhythm at 76 bpm with left ventricular hypertrophy ST elevations noted inferior leads.  Reviewed labs, imaging, and pertinent records as noted above in HPI  Assessment and Plan:  Left femoral neck fracture secondary to fall Patient however presented after having an unwitnessed fall.  Daughter questions that the patient possibly tripped over a rug that they placed in room.  X-rays revealed nondisplaced impacted fracture of the left femoral neck.  Dr. Alvan Dame of orthopedics had been consulted and recommended keeping patient n.p.o. for possible need of procedure. -Admit to MedSurg bed -Hip fracture order set utilized -N.p.o. for possible need of procedure today if family is amenable -Hydrocodone/morphine IV as needed for moderate to severe pain respectively -Senokot-S nightly -Consult placed to anesthesia for nerve block -Appreciate orthopedic consultative services we will follow-up for any further recommendations  Leukocytosis Acute.  WBC elevated at 18.  Could be related to fracture, but on the differential also includes possibility of pneumonia and patient also has history of urinary tract infections. -Check urinalysis and other studies as noted below -Recheck CBC tomorrow morning  Abnormal chest x-ray Acute.  Chest x-ray had noted nonspecific increased coarse bilateral pulmonary interstitial opacity, but no prior exams to compare.  Differential includes possibility of interstitial lung disease, -Check screening labs for COVID-19, RSV, and influenza -Check procalcitonin -Check urine Legionella and urine strep  Dehydration Acute.  Labs noted elevated BUN to creatinine ratio of greater than 20 to suggest patient likely dehydrated.  She has been started on lactated Ringer IV fluids at 75 mL/h while n.p.o. -Continue lactated Ringer IV fluids at  75 mL/h for patient to receive total of at least 1 L.\  Urinary retention Acute.  Patient noted to have several episodes of incontinence while in the ED,  but when Foley catheter was placed patient had over 800 mL of urine out for which suspect some aspect of urinary retention. -Continue Foley catheter    Chronic kidney disease stage IIIa Stable.  Creatinine 1 which appears relatively around previous documented baseline. -Continue to monitor  Alzheimer's dementia Patient's daughter over the phone confirms that she has advanced dementia and just recently moved to the memory care unit at [facility] -Delirium precautions -Continue Namenda  DVT prophylaxis: Lovenox Advance Care Planning:   Code Status: DNR    Consults: Orthopedics  Family Communication: Daughter [name] updated over the phone  Severity of Illness: The appropriate patient status for this patient is INPATIENT. Inpatient status is judged to be reasonable and necessary in order to provide the required intensity of service to ensure the patient's safety. The patient's presenting symptoms, physical exam findings, and initial radiographic and laboratory data in the context of their chronic comorbidities is felt to place them at high risk for further clinical deterioration. Furthermore, it is not anticipated that the patient will be medically stable for discharge from the hospital within 2 midnights of admission.   * I certify that at the point of admission it is my clinical judgment that the patient will require inpatient hospital care spanning beyond 2 midnights from the point of admission due to high intensity of service, high risk for further deterioration and high frequency of surveillance required.*  Author: Norval Morton, MD 10/13/2022 7:07 AM  For on call review [email or web address].

## 2022-10-13 NOTE — ED Triage Notes (Signed)
Pt arrives to ED c/o unwitnessed fall from [facility]. Unknown LOC, skin tear to left elbow noted. Pt in c-collar. Pt at baseline per facility

## 2022-10-13 NOTE — Anesthesia Preprocedure Evaluation (Addendum)
Anesthesia Evaluation  Patient identified by MRN, date of birth, ID band Patient awake and Patient confused    Reviewed: Allergy & Precautions, Patient's Chart, lab work & pertinent test results, reviewed documented beta blocker date and time   History of Anesthesia Complications Negative for: history of anesthetic complications  Airway Mallampati: II  TM Distance: >3 FB Neck ROM: Full    Dental no notable dental hx.    Pulmonary neg pulmonary ROS   Pulmonary exam normal        Cardiovascular hypertension, Pt. on medications and Pt. on home beta blockers Normal cardiovascular exam     Neuro/Psych       Dementia negative neurological ROS     GI/Hepatic negative GI ROS, Neg liver ROS,,,  Endo/Other  negative endocrine ROS    Renal/GU Renal InsufficiencyRenal disease  negative genitourinary   Musculoskeletal  (+) Arthritis ,  Left Femoral Neck Fracture   Abdominal   Peds  Hematology Hgb 13.5, Plt 196   Anesthesia Other Findings Angle-closure glaucoma  Reproductive/Obstetrics negative OB ROS                              Anesthesia Physical Anesthesia Plan  ASA: 3  Anesthesia Plan: Spinal   Post-op Pain Management: Ofirmev IV (intra-op)*   Induction:   PONV Risk Score and Plan: 2 and Treatment may vary due to age or medical condition, Propofol infusion and Ondansetron  Airway Management Planned: Natural Airway and Simple Face Mask  Additional Equipment: None  Intra-op Plan:   Post-operative Plan:   Informed Consent: I have reviewed the patients History and Physical, chart, labs and discussed the procedure including the risks, benefits and alternatives for the proposed anesthesia with the patient or authorized representative who has indicated his/her understanding and acceptance.   Patient has DNR.  Discussed DNR with power of attorney and Continue DNR.   Consent reviewed with  POA  Plan Discussed with: CRNA  Anesthesia Plan Comments:          Anesthesia Quick Evaluation

## 2022-10-14 ENCOUNTER — Ambulatory Visit: Payer: Medicare Other | Admitting: Nurse Practitioner

## 2022-10-14 DIAGNOSIS — S72002A Fracture of unspecified part of neck of left femur, initial encounter for closed fracture: Secondary | ICD-10-CM | POA: Diagnosis not present

## 2022-10-14 LAB — CBC
HCT: 36.1 % (ref 36.0–46.0)
Hemoglobin: 12 g/dL (ref 12.0–15.0)
MCH: 31.7 pg (ref 26.0–34.0)
MCHC: 33.2 g/dL (ref 30.0–36.0)
MCV: 95.5 fL (ref 80.0–100.0)
Platelets: 181 10*3/uL (ref 150–400)
RBC: 3.78 MIL/uL — ABNORMAL LOW (ref 3.87–5.11)
RDW: 13.3 % (ref 11.5–15.5)
WBC: 16.2 10*3/uL — ABNORMAL HIGH (ref 4.0–10.5)
nRBC: 0 % (ref 0.0–0.2)

## 2022-10-14 LAB — BASIC METABOLIC PANEL
Anion gap: 17 — ABNORMAL HIGH (ref 5–15)
BUN: 28 mg/dL — ABNORMAL HIGH (ref 8–23)
CO2: 21 mmol/L — ABNORMAL LOW (ref 22–32)
Calcium: 8.7 mg/dL — ABNORMAL LOW (ref 8.9–10.3)
Chloride: 103 mmol/L (ref 98–111)
Creatinine, Ser: 1.09 mg/dL — ABNORMAL HIGH (ref 0.44–1.00)
GFR, Estimated: 49 mL/min — ABNORMAL LOW (ref >60)
Glucose, Bld: 150 mg/dL — ABNORMAL HIGH (ref 70–99)
Potassium: 3.5 mmol/L (ref 3.5–5.1)
Sodium: 141 mmol/L (ref 135–145)

## 2022-10-14 LAB — LEGIONELLA PNEUMOPHILA SEROGP 1 UR AG: L. pneumophila Serogp 1 Ur Ag: NEGATIVE

## 2022-10-14 MED ORDER — MEMANTINE HCL 10 MG PO TABS
5.0000 mg | ORAL_TABLET | Freq: Every day | ORAL | Status: DC
  Administered 2022-10-15 – 2022-10-19 (×5): 5 mg via ORAL
  Filled 2022-10-14 (×6): qty 1

## 2022-10-14 MED ORDER — QUETIAPINE FUMARATE 25 MG PO TABS
12.5000 mg | ORAL_TABLET | Freq: Every day | ORAL | Status: DC
  Administered 2022-10-15 – 2022-10-19 (×5): 12.5 mg via ORAL
  Filled 2022-10-14 (×6): qty 1

## 2022-10-14 MED ORDER — IPRATROPIUM-ALBUTEROL 0.5-2.5 (3) MG/3ML IN SOLN: 3.0000 mL | RESPIRATORY_TRACT | Status: DC | PRN

## 2022-10-14 MED ORDER — METOPROLOL SUCCINATE ER 50 MG PO TB24
100.0000 mg | ORAL_TABLET | Freq: Every day | ORAL | Status: DC
  Administered 2022-10-15 – 2022-10-21 (×7): 100 mg via ORAL
  Filled 2022-10-14 (×8): qty 2

## 2022-10-14 MED ORDER — GUAIFENESIN 100 MG/5ML PO LIQD: 5.0000 mL | ORAL | Status: DC | PRN

## 2022-10-14 MED ORDER — HALOPERIDOL LACTATE 5 MG/ML IJ SOLN
2.0000 mg | Freq: Once | INTRAMUSCULAR | Status: AC
  Administered 2022-10-14: 2 mg via INTRAVENOUS
  Filled 2022-10-14: qty 1

## 2022-10-14 MED ORDER — TRAZODONE HCL 50 MG PO TABS
50.0000 mg | ORAL_TABLET | Freq: Every evening | ORAL | Status: DC | PRN
  Administered 2022-10-15: 50 mg via ORAL
  Filled 2022-10-14 (×2): qty 1

## 2022-10-14 MED ORDER — METOPROLOL TARTRATE 5 MG/5ML IV SOLN: 5.0000 mg | INTRAVENOUS | Status: DC | PRN

## 2022-10-14 MED ORDER — MELATONIN 3 MG PO TABS
3.0000 mg | ORAL_TABLET | Freq: Every day | ORAL | Status: DC
  Administered 2022-10-15 – 2022-10-20 (×6): 3 mg via ORAL
  Filled 2022-10-14 (×7): qty 1

## 2022-10-14 MED ORDER — HYDRALAZINE HCL 20 MG/ML IJ SOLN: 10.0000 mg | INTRAMUSCULAR | Status: DC | PRN

## 2022-10-14 NOTE — Progress Notes (Signed)
PROGRESS NOTE    Emily Dawson  [id number] DOB: [birth year] DOA: 10/13/2022 PCP: Edmonia Caprio, NP   Brief Narrative:  89 year old Alzheimer's, HTN, CKD, breast cancer status post bilateral mastectomy, arthritis, glaucoma admitted after unwitnessed fall.  In the ED CT of the head and cervical spine did not show acute pathology, chest x-ray showed mild increased coarse bilateral infiltrate.  X-ray of the hip showed nondisplaced fracture of the left femoral neck, orthopedic was consulted.  Patient underwent left total hip replacement by Dr. Alvan Dame on 2/8.   Assessment & Plan:    Left femoral neck fracture secondary to fall Status post total hip replacement on 2/8 Unwitnessed fall.  CT head and cervical spine negative.  Suffered from left femoral neck fracture and orthopedic performed total hip replacement on 2/8.  Pain management, postop prophylaxis, surgical site management, PT/OT weightbearing precautions and follow-up per orthopedic provider. Bowel regimen   Leukocytosis Abnormal chest x-ray I suspect reactive in nature.  UA is negative, chest x-ray did not show infiltrate.  Flu/RSV/COVID-negative.  Procalcitonin negative.  Monitor WBC.  As needed bronchodilators   Dehydration Clinical signs of dehydration, gentle hydration   Urinary retention, acute 800 cc of urinary retention in the ED.  Foley catheter in place   Chronic kidney disease stage II Stable.  Creatinine stable around 1.0  Essential hypertension - Will resume home Toprol-XL.  IV as needed ordered   Alzheimer's dementia, advance Supportive care, continue Namenda  Appears patient has very advanced dementia and does not appear to be a good rehab candidate.  I have suggested to the family that they should really explore options for hospice.  Both the daughters are in agreement.  I have consulted TOC who will reach out to her long-term care facility and hopefully make hospice arrangements    DVT prophylaxis:  Postop DVT prophylaxis per orthopedic Code Status: DNR Family Communication: Discussed with the daughter at bedside and another daughter over the phone together  Status is: Inpatient Remains inpatient appropriate because: Awaiting safe disposition    Subjective: Patient is sleeping this morning, wakes up but confused. I had an extensive discussion with patient's daughters who tells me that patient's mentation and dementia has significantly progressed over the last 5 years where she mostly does not recognize anyone.  She was moved to memory care unit about 5 days ago before she tripped and fell.  Both the daughters agree and wishes to keep her mother as comfortable as possible and agree that she should be transition back to long-term care facility with hospice team to follow.   Examination:  General exam: Appears calm and comfortable, chronically ill and frail Respiratory system: Clear to auscultation. Respiratory effort normal. Cardiovascular system: S1 & S2 heard, RRR. No JVD, murmurs, rubs, gallops or clicks. No pedal edema. Gastrointestinal system: Abdomen is nondistended, soft and nontender. No organomegaly or masses felt. Normal bowel sounds heard. Central nervous system: Alert to name only Extremities: No significant swelling Skin: No rashes, lesions or ulcers Psychiatry: Poor judgment and insight  Bilateral upper extremity mittens in place Objective: Vitals:   10/14/22 0021 10/14/22 0022 10/14/22 0404 10/14/22 0639  BP: (!) 161/99 (!) 161/99 (!) 192/123 (!) 184/93  Pulse: 84 84 (!) 129 97  Resp:  17  17  Temp: (!) 97.5 F (36.4 C) (!) 97.5 F (36.4 C) 98.3 F (36.8 C) 98.4 F (36.9 C)  TempSrc: Oral Oral Oral Oral  SpO2: 100% 100%    Weight:  Height:        Intake/Output Summary (Last 24 hours) at 10/14/2022 0815 Last data filed at 10/14/2022 [id number] Gross per 24 hour  Intake 1765.64 ml  Output 925 ml  Net 840.64 ml   Filed Weights   10/13/22 1026  Weight: 40  kg     Data Reviewed:   CBC: Recent Labs  Lab 10/13/22 0247 10/14/22 0412  WBC 18.0* 16.2*  NEUTROABS 14.6*  --   HGB 13.5 12.0  HCT 38.8 36.1  MCV 95.3 95.5  PLT 196 0000000   Basic Metabolic Panel: Recent Labs  Lab 10/13/22 0247 10/14/22 0412  NA 140 141  K 3.7 3.5  CL 107 103  CO2 24 21*  GLUCOSE 138* 150*  BUN 37* 28*  CREATININE 1.00 1.09*  CALCIUM 9.3 8.7*   GFR: Estimated Creatinine Clearance: 23.4 mL/min (A) (by C-G formula based on SCr of 1.09 mg/dL (H)). Liver Function Tests: No results for input(s): "AST", "ALT", "ALKPHOS", "BILITOT", "PROT", "ALBUMIN" in the last 168 hours. No results for input(s): "LIPASE", "AMYLASE" in the last 168 hours. No results for input(s): "AMMONIA" in the last 168 hours. Coagulation Profile: Recent Labs  Lab 10/13/22 0247  INR 1.1   Cardiac Enzymes: No results for input(s): "CKTOTAL", "CKMB", "CKMBINDEX", "TROPONINI" in the last 168 hours. BNP (last 3 results) No results for input(s): "PROBNP" in the last 8760 hours. HbA1C: No results for input(s): "HGBA1C" in the last 72 hours. CBG: No results for input(s): "GLUCAP" in the last 168 hours. Lipid Profile: No results for input(s): "CHOL", "HDL", "LDLCALC", "TRIG", "CHOLHDL", "LDLDIRECT" in the last 72 hours. Thyroid Function Tests: No results for input(s): "TSH", "T4TOTAL", "FREET4", "T3FREE", "THYROIDAB" in the last 72 hours. Anemia Panel: No results for input(s): "VITAMINB12", "FOLATE", "FERRITIN", "TIBC", "IRON", "RETICCTPCT" in the last 72 hours. Sepsis Labs: Recent Labs  Lab 10/13/22 0247  PROCALCITON <0.10    Recent Results (from the past 240 hour(s))  Surgical pcr screen     Status: None   Collection Time: 10/13/22  6:17 AM   Specimen: Nasal Mucosa; Nasal Swab  Result Value Ref Range Status   MRSA, PCR NEGATIVE NEGATIVE Final   Staphylococcus aureus NEGATIVE NEGATIVE Final    Comment: (NOTE) The Xpert SA Assay (FDA approved for NASAL specimens in patients  8 years of age and older), is one component of a comprehensive surveillance program. It is not intended to diagnose infection nor to guide or monitor treatment. Performed at [facility], [place] [address] [place], [place] [address]   Resp panel by RT-PCR (RSV, Flu A&B, Covid) Anterior Nasal Swab     Status: None   Collection Time: 10/13/22  7:24 AM   Specimen: Anterior Nasal Swab  Result Value Ref Range Status   SARS Coronavirus 2 by RT PCR NEGATIVE NEGATIVE Final   Influenza A by PCR NEGATIVE NEGATIVE Final   Influenza B by PCR NEGATIVE NEGATIVE Final    Comment: (NOTE) The Xpert Xpress SARS-CoV-2/FLU/RSV plus assay is intended as an aid in the diagnosis of influenza from Nasopharyngeal swab specimens and should not be used as a sole basis for treatment. Nasal washings and aspirates are unacceptable for Xpert Xpress SARS-CoV-2/FLU/RSV testing.  Fact Sheet for Patients: EntrepreneurPulse.com.au  Fact Sheet for Healthcare Providers: IncredibleEmployment.be  This test is not yet approved or cleared by the [place] FDA and has been authorized for detection and/or diagnosis of SARS-CoV-2 by FDA under an Emergency Use Authorization (EUA). This EUA will remain in  effect (meaning this test can be used) for the duration of the COVID-19 declaration under Section 564(b)(1) of the Act, 21 U.S.C. section 360bbb-3(b)(1), unless the authorization is terminated or revoked.     Resp Syncytial Virus by PCR NEGATIVE NEGATIVE Final    Comment: (NOTE) Fact Sheet for Patients: EntrepreneurPulse.com.au  Fact Sheet for Healthcare Providers: IncredibleEmployment.be  This test is not yet approved or cleared by the [place] FDA and has been authorized for detection and/or diagnosis of SARS-CoV-2 by FDA under an Emergency Use Authorization (EUA). This EUA will remain in effect (meaning this test can be used) for  the duration of the COVID-19 declaration under Section 564(b)(1) of the Act, 21 U.S.C. section 360bbb-3(b)(1), unless the authorization is terminated or revoked.  Performed at [facility], [place] [address], [place], [place] [address]          Radiology Studies: DG Pelvis Portable  Result Date: 10/13/2022 CLINICAL DATA:  Status post left total hip arthroplasty. EXAM: PORTABLE PELVIS 1-2 VIEWS COMPARISON:  Radiographs of same day. FINDINGS: The left femoral and acetabular components are well situated. Expected postoperative changes seen in the surrounding soft tissues. IMPRESSION: Status post left total hip arthroplasty. Electronically Signed   By: Marijo Conception M.D.   On: 10/13/2022 15:24   DG C-Arm 1-60 Min-No Report  Result Date: 10/13/2022 Fluoroscopy was utilized by the requesting physician.  No radiographic interpretation.   DG Chest Port 1 View  Result Date: 10/13/2022 CLINICAL DATA:  89 year old female status post fall with left femoral neck fracture. EXAM: PORTABLE CHEST 1 VIEW COMPARISON:  Left hip series 0305 hours today. Cervical spine CT today. FINDINGS: Portable AP supine view at 0402 hours. Lung volumes and cardiac size are at the upper limits of normal. Other mediastinal contours are within normal limits. Visualized tracheal air column is within normal limits. Right greater than left axillary surgical clips. No pneumothorax, pleural effusion or confluent lung opacity. Diffuse mildly increased and coarse bilateral pulmonary interstitial opacity is nonspecific. Lung apices on cervical spine CT today demonstrated only mild scarring. Oval osseous structures appear intact. IMPRESSION: Nonspecific mildly increased, coarse bilateral pulmonary interstitial opacity with no previous exams. These could be chronic interstitial lung changes. In the appropriate setting of viral/atypical respiratory infection would be difficult to exclude. But otherwise no acute cardiopulmonary  abnormality. Electronically Signed   By: Genevie Ann M.D.   On: 10/13/2022 04:16   CT Cervical Spine Wo Contrast  Result Date: 10/13/2022 CLINICAL DATA:  Neck trauma (Age >= 65y).  Unwitnessed fall EXAM: CT CERVICAL SPINE WITHOUT CONTRAST TECHNIQUE: Multidetector CT imaging of the cervical spine was performed without intravenous contrast. Multiplanar CT image reconstructions were also generated. RADIATION DOSE REDUCTION: This exam was performed according to the departmental dose-optimization program which includes automated exposure control, adjustment of the mA and/or kV according to patient size and/or use of iterative reconstruction technique. COMPARISON:  None Available. FINDINGS: Alignment: Normal Skull base and vertebrae: No acute fracture. No primary bone lesion or focal pathologic process. Soft tissues and spinal canal: No prevertebral fluid or swelling. No visible canal hematoma. Disc levels: Degenerative disc disease at C5-6 and C6-7 with disc space narrowing and spurring. Moderate bilateral degenerative facet disease, right greater than left. Fusion across the right C2-3 facet. Upper chest: Biapical scarring.  No acute findings. Other: None IMPRESSION: Degenerative disc and facet disease.  No acute bony abnormality. Electronically Signed   By: Rolm Baptise M.D.   On: 10/13/2022 03:31  CT Head Wo Contrast  Result Date: 10/13/2022 CLINICAL DATA:  Head trauma, minor (Age >= 65y).  Fall. EXAM: CT HEAD WITHOUT CONTRAST TECHNIQUE: Contiguous axial images were obtained from the base of the skull through the vertex without intravenous contrast. RADIATION DOSE REDUCTION: This exam was performed according to the departmental dose-optimization program which includes automated exposure control, adjustment of the mA and/or kV according to patient size and/or use of iterative reconstruction technique. COMPARISON:  None Available. FINDINGS: Brain: There is atrophy and chronic small vessel disease changes. No acute  intracranial abnormality. Specifically, no hemorrhage, hydrocephalus, mass lesion, acute infarction, or significant intracranial injury. Vascular: No hyperdense vessel or unexpected calcification. Skull: No acute calvarial abnormality. Sinuses/Orbits: No acute findings Other: None IMPRESSION: Atrophy, chronic microvascular disease. No acute intracranial abnormality. Electronically Signed   By: Rolm Baptise M.D.   On: 10/13/2022 03:29   DG Hip Unilat W or Wo Pelvis 2-3 Views Left  Result Date: 10/13/2022 CLINICAL DATA:  Unwitnessed fall EXAM: DG HIP (WITH OR WITHOUT PELVIS) 2-3V LEFT COMPARISON:  None Available. FINDINGS: Degenerative changes in the left hip with joint space narrowing and sclerosis and osteophyte formation. Mild deformity of the left hip with cortical irregularity along the femoral neck likely represents an impacted acute fracture. No inter trochanteric involvement is suggested. Pelvis and sacrum appear intact. Surgical clips in the pelvis. IMPRESSION: Nondisplaced impacted fracture of the left femoral neck. Degenerative changes in the left hip. Electronically Signed   By: Lucienne Capers M.D.   On: 10/13/2022 03:24        Scheduled Meds:  aspirin  81 mg Oral BID   Chlorhexidine Gluconate Cloth  6 each Topical Q0600   dexamethasone (DECADRON) injection  10 mg Intravenous Once   docusate sodium  100 mg Oral BID   polyethylene glycol  17 g Oral BID   senna-docusate  1 tablet Oral QHS   Continuous Infusions:  sodium chloride 75 mL/hr at 10/14/22 0637   methocarbamol (ROBAXIN) IV Stopped (10/14/22 0235)     LOS: 1 day   Time spent= 35 mins    Dakota Vanwart Arsenio Loader, MD [facility]  If 7PM-7AM, please contact night-coverage  10/14/2022, 8:15 AM

## 2022-10-14 NOTE — Progress Notes (Signed)
Patient woke up around 04:00am, started to scream "help." Upon arriving to the room, patient legs were hanging off the siderail with one hand holding her foley and IV lines. Patient started to swing her arms at staff members, while screaming for her grandmother. Morphine was given. Requested additional interventions due to patient consistent yelling and attempted to pull her lines. Haldol was given.

## 2022-10-14 NOTE — Progress Notes (Signed)
MD states we can keep foley in place

## 2022-10-14 NOTE — Progress Notes (Signed)
PT Cancellation Note  Patient Details Name: Lendsey Wedeking MRN: [id number] DOB: May 21, 1934   Cancelled Treatment:    Reason Eval/Treat Not Completed: Fatigue/lethargy limiting ability to participate;Patient not medically ready Per nurse, pt was agitated this morning and refusing medications and breakfast, currently sleeping. Spoke with MD and plan is likely to dc back to [facility] on hospice potentially? Will follow up when pt more alert and if able to participate.    Marguarite Arbour A Cavon Nicolls 10/14/2022, 12:11 PM Marisa Severin, PT, DPT Acute Rehabilitation Services Secure chat preferred Office [phone]

## 2022-10-14 NOTE — Progress Notes (Signed)
Patient refused breakfast and medication this morning MD is aware.

## 2022-10-14 NOTE — TOC CAGE-AID Note (Signed)
Transition of Care [facility]) - CAGE-AID Screening   Patient Details  Name: Emily Dawson MRN: [id number] Date of Birth: 1934/02/11  Transition of Care [facility]) CM/SW Contact:    Bethann Berkshire, [place] Phone Number: 10/14/2022, 9:03 AM   CAGE-AID Screening: Substance Abuse Screening unable to be completed due to: : Patient unable to participate

## 2022-10-14 NOTE — Progress Notes (Addendum)
Subjective: 1 Day Post-Op Procedure(s) (LRB): LEFT TOTAL HIP ARTHROPLASTY ANTERIOR APPROACH (Left)   Patient with advanced dementia, mittens on hands.   She tells me she is not in pain. She is responsive to questions, but clearly confused.   Objective: Vital signs in last 24 hours: Temp:  [97.4 F (36.3 C)-98.4 F (36.9 C)] 98.2 F (36.8 C) (02/09 0818) Pulse Rate:  [49-129] 105 (02/09 0818) Resp:  [10-18] 17 (02/09 0639) BP: (103-192)/(57-123) 165/93 (02/09 0818) SpO2:  [86 %-100 %] 100 % (02/09 0818) Weight:  [40 kg] 40 kg (02/08 1026)  Intake/Output from previous day:  Intake/Output Summary (Last 24 hours) at 10/14/2022 0823 Last data filed at 10/14/2022 [id number] Gross per 24 hour  Intake 1765.64 ml  Output 925 ml  Net 840.64 ml     Intake/Output this shift: No intake/output data recorded.  Labs: Recent Labs    10/13/22 0247 10/14/22 0412  HGB 13.5 12.0   Recent Labs    10/13/22 0247 10/14/22 0412  WBC 18.0* 16.2*  RBC 4.07 3.78*  HCT 38.8 36.1  PLT 196 181   Recent Labs    10/13/22 0247 10/14/22 0412  NA 140 141  K 3.7 3.5  CL 107 103  CO2 24 21*  BUN 37* 28*  CREATININE 1.00 1.09*  GLUCOSE 138* 150*  CALCIUM 9.3 8.7*   Recent Labs    10/13/22 0247  INR 1.1    Exam: General - Patient is Alert and Appropriate Extremity - Neurologically intact Sensation intact distally Intact pulses distally Dorsiflexion/Plantar flexion intact Dressing - dressing C/D/I Motor Function - intact, moving foot and toes well on exam.   Past Medical History:  Diagnosis Date   Abnormal thyroid blood test 11/04/2010   Dr.Berglind's lab results indicated TSH was low, Per records provided by patient at new patient appointment    Alzheimer disease [facility])    Per [facility] New Patient packet   Angle-closure glaucoma    Per records provided by patient at new patient appointment    Arthritis    Per [facility] New Patient packet   Breast cancer ([place])    Chemo 04/2001-07/2001,  Per [facility] New Patient packet   Chronic kidney disease    Per [facility] New Patient packet   Chronic UTI    Per [facility] New Patient packet   Colon polyps    Per [facility] New Patient packet   Dementia [facility])    Per [facility] New Patient packet   Eczema    Per [facility] New Patient packet   Glaucoma    Per [facility] New Patient packet   H/O angiography 02/2019   Per [place] Patient Packet   H/O CT scan of head 01/2020   Per [place] New Patient Packet   H/O cystoscopy 03/12/2014   Dr.Kelly Venia Minks, Per [facility] New Patient packet   H/O laser iridotomy 06/2015   Per [facility] New Patient Packet   Hematuria    Per [facility] New Patient packet   Henoch-Schonlein purpura [facility])    Dr.Heher, Per [facility] New Patient packet   Hives    Per records provided by patient at new patient appointment    Hypertension    Per [facility] New Patient packet   Low TSH level    Memory loss    Over the past 6 years (as of 2021), Per [facility] New Patient packet   Osteoporosis    Per [facility] New Patient packet   Retinal vein occlusion of left eye    Per  [place] New Patient packet   Thyroid disease    Per [place] New Patient packet   Vitamin D deficiency    Per [place] New Patient packet    Assessment/Plan: 1 Day Post-Op Procedure(s) (LRB): LEFT TOTAL HIP ARTHROPLASTY ANTERIOR APPROACH (Left) Principal Problem:   Left displaced femoral neck fracture (HCC) Active Problems:   CKD (chronic kidney disease) stage 3, GFR 30-59 ml/min (HCC)   Falls, initial encounter   Leukocytosis   Abnormal chest x-ray   Dehydration   Acute urinary retention   S/P total left hip arthroplasty  Estimated body mass index is 17.22 kg/m as calculated from the following:   Height as of this encounter: 5' (1.524 m).   Weight as of this encounter: 40 kg. Advance diet Up with therapy D/C IV fluids  DVT Prophylaxis - Aspirin Weight bearing as tolerated.  Patient with advanced dementia.  Up with PT today as able.  Disposition per PT and hospitalist recommendations.   Clear from an orthopaedic  standpoint whenever medically stable.    Griffith Citron, PA-C Orthopedic Surgery [phone] 10/14/2022, 8:23 AM

## 2022-10-14 NOTE — Progress Notes (Addendum)
CSW spoke with Rollene Fare at [facility] who states the patient can return to the facility once she is medically ready.  CSW spoke with Delsa Sale at [facility] who states the patient is active with the agency for hospice services. [facility] will follow up with patient directly once she is discharged back to the facility.  Madilyn Fireman, MSW, LCSW Transitions of Care  Clinical Social Worker II [phone]

## 2022-10-14 NOTE — Plan of Care (Signed)
  Problem: Education: Goal: Knowledge of General Education information will improve Description Including pain rating scale, medication(s)/side effects and non-pharmacologic comfort measures Outcome: Progressing   Problem: Health Behavior/Discharge Planning: Goal: Ability to manage health-related needs will improve Outcome: Progressing   Problem: Clinical Measurements: Goal: Ability to maintain clinical measurements within normal limits will improve Outcome: Progressing Goal: Will remain free from infection Outcome: Progressing Goal: Diagnostic test results will improve Outcome: Progressing Goal: Respiratory complications will improve Outcome: Progressing Goal: Cardiovascular complication will be avoided Outcome: Progressing   Problem: Activity: Goal: Risk for activity intolerance will decrease Outcome: Progressing   Problem: Nutrition: Goal: Adequate nutrition will be maintained Outcome: Progressing   Problem: Coping: Goal: Level of anxiety will decrease Outcome: Progressing   Problem: Elimination: Goal: Will not experience complications related to bowel motility Outcome: Progressing Goal: Will not experience complications related to urinary retention Outcome: Progressing   Problem: Pain Managment: Goal: General experience of comfort will improve Outcome: Progressing   Problem: Safety: Goal: Ability to remain free from injury will improve Outcome: Progressing   Problem: Skin Integrity: Goal: Risk for impaired skin integrity will decrease Outcome: Progressing   Problem: Education: Goal: Knowledge of the prescribed therapeutic regimen will improve Outcome: Progressing Goal: Understanding of discharge needs will improve Outcome: Progressing Goal: Individualized Educational Video(s) Outcome: Progressing   Problem: Activity: Goal: Ability to avoid complications of mobility impairment will improve Outcome: Progressing Goal: Ability to tolerate increased  activity will improve Outcome: Progressing   Problem: Clinical Measurements: Goal: Postoperative complications will be avoided or minimized Outcome: Progressing   Problem: Pain Management: Goal: Pain level will decrease with appropriate interventions Outcome: Progressing   Problem: Skin Integrity: Goal: Will show signs of wound healing Outcome: Progressing   

## 2022-10-14 NOTE — Progress Notes (Signed)
PT Cancellation Note  Patient Details Name: Emily Dawson MRN: [id number] DOB: 12-16-1933   Cancelled Treatment:    Reason Eval/Treat Not Completed: Active bedrest order Currently still on bedrest. Will await increase in activity orders prior to PT evaluation. Will follow.   Marguarite Arbour A Tyreck Bell 10/14/2022, 7:00 AM Marisa Severin, PT, DPT Acute Rehabilitation Services Secure chat preferred Office [phone]

## 2022-10-15 ENCOUNTER — Other Ambulatory Visit: Payer: Self-pay | Admitting: Nurse Practitioner

## 2022-10-15 DIAGNOSIS — Z96642 Presence of left artificial hip joint: Secondary | ICD-10-CM | POA: Diagnosis not present

## 2022-10-15 DIAGNOSIS — S72002A Fracture of unspecified part of neck of left femur, initial encounter for closed fracture: Secondary | ICD-10-CM | POA: Diagnosis not present

## 2022-10-15 DIAGNOSIS — I10 Essential (primary) hypertension: Secondary | ICD-10-CM

## 2022-10-15 LAB — BASIC METABOLIC PANEL
Anion gap: 9 (ref 5–15)
BUN: 27 mg/dL — ABNORMAL HIGH (ref 8–23)
CO2: 24 mmol/L (ref 22–32)
Calcium: 8.2 mg/dL — ABNORMAL LOW (ref 8.9–10.3)
Chloride: 111 mmol/L (ref 98–111)
Creatinine, Ser: 0.82 mg/dL (ref 0.44–1.00)
GFR, Estimated: >60 mL/min (ref >60)
Glucose, Bld: 109 mg/dL — ABNORMAL HIGH (ref 70–99)
Potassium: 3.1 mmol/L — ABNORMAL LOW (ref 3.5–5.1)
Sodium: 144 mmol/L (ref 135–145)

## 2022-10-15 LAB — CBC
HCT: 29.7 % — ABNORMAL LOW (ref 36.0–46.0)
Hemoglobin: 10.7 g/dL — ABNORMAL LOW (ref 12.0–15.0)
MCH: 33.5 pg (ref 26.0–34.0)
MCHC: 36 g/dL (ref 30.0–36.0)
MCV: 93.1 fL (ref 80.0–100.0)
Platelets: 143 10*3/uL — ABNORMAL LOW (ref 150–400)
RBC: 3.19 MIL/uL — ABNORMAL LOW (ref 3.87–5.11)
RDW: 13.4 % (ref 11.5–15.5)
WBC: 13.5 10*3/uL — ABNORMAL HIGH (ref 4.0–10.5)
nRBC: 0 % (ref 0.0–0.2)

## 2022-10-15 LAB — MAGNESIUM: Magnesium: 2.1 mg/dL (ref 1.7–2.4)

## 2022-10-15 MED ORDER — HYDRALAZINE HCL 20 MG/ML IJ SOLN: 10.0000 mg | INTRAMUSCULAR | Status: DC | PRN

## 2022-10-15 MED ORDER — AMLODIPINE BESYLATE 5 MG PO TABS
5.0000 mg | ORAL_TABLET | Freq: Every day | ORAL | Status: DC
  Administered 2022-10-15: 5 mg via ORAL
  Filled 2022-10-15: qty 1

## 2022-10-15 MED ORDER — POTASSIUM CHLORIDE CRYS ER 20 MEQ PO TBCR
40.0000 meq | EXTENDED_RELEASE_TABLET | ORAL | Status: AC
  Administered 2022-10-15 (×2): 40 meq via ORAL
  Filled 2022-10-15 (×2): qty 2

## 2022-10-15 MED ORDER — POLYETHYLENE GLYCOL 3350 17 G PO PACK: 17.0000 g | PACK | Freq: Every day | ORAL | 0 refills | Status: AC | PRN

## 2022-10-15 MED ORDER — DOCUSATE SODIUM 100 MG PO CAPS: 100.0000 mg | ORAL_CAPSULE | Freq: Two times a day (BID) | ORAL | 0 refills | Status: DC

## 2022-10-15 MED ORDER — AMLODIPINE BESYLATE 5 MG PO TABS
5.0000 mg | ORAL_TABLET | Freq: Once | ORAL | Status: AC
  Administered 2022-10-15: 5 mg via ORAL
  Filled 2022-10-15: qty 1

## 2022-10-15 MED ORDER — AMLODIPINE BESYLATE 10 MG PO TABS
10.0000 mg | ORAL_TABLET | Freq: Every day | ORAL | Status: DC
  Administered 2022-10-16 – 2022-10-21 (×6): 10 mg via ORAL
  Filled 2022-10-15 (×6): qty 1

## 2022-10-15 MED ORDER — AMLODIPINE BESYLATE 5 MG PO TABS: 5.0000 mg | ORAL_TABLET | Freq: Every day | ORAL | 0 refills | Status: DC

## 2022-10-15 NOTE — Evaluation (Signed)
Occupational Therapy Evaluation Patient Details Name: Emily Dawson MRN: [id number] DOB: [birth year] Today's Date: 10/15/2022   History of Present Illness Pt is an 89 y/o female who presents s/p unwitnessed fall at memory care ([facility]) where she moved to about 3 days PTA. She sustained a L femoral neck fx and is now s/p L total hip replacement (anterior approach) on 10/13/2022. PMH significant for Alzheimer's dementia, Breast CA s/p bilateral mastectomy), CKD, glaucoma, HTN, osteoporosis, retinal vein occlusion L eye, thyroid disease.   Clinical Impression   Pt was evaluated s/p the above admission list, she is from [facility] care. Upon evaluation she was limited by baseline advanced dementia, general weakness, LLE pain, unsteady balance and decreased activity tolerance. Overall she required mod A +2 for bed mobility and min A +2 for simple transfers with RW. Due to the deficits listed below, she requires up to max A for ADLs along with maximal cues. OT to continue to follow acutely. Recommend d/c to Snf for safety and continued therapies.      Recommendations for follow up therapy are one component of a multi-disciplinary discharge planning process, led by the attending physician.  Recommendations may be updated based on patient status, additional functional criteria and insurance authorization.   Follow Up Recommendations  Skilled nursing-short term rehab (<3 hours/day)     Assistance Recommended at Discharge Frequent or constant Supervision/Assistance  Patient can return home with the following A lot of help with walking and/or transfers;A lot of help with bathing/dressing/bathroom;Assistance with cooking/housework;Assistance with feeding;Direct supervision/assist for medications management;Direct supervision/assist for financial management;Assist for transportation;Help with stairs or ramp for entrance    Functional Status Assessment  Patient has had a recent decline in their  functional status and demonstrates the ability to make significant improvements in function in a reasonable and predictable amount of time.  Equipment Recommendations  None recommended by OT (defer)    Recommendations for Other Services       Precautions / Restrictions Precautions Precautions: Fall Precaution Comments: advanced dementia Restrictions Weight Bearing Restrictions: No      Mobility Bed Mobility Overal bed mobility: Needs Assistance Bed Mobility: Supine to Sit     Supine to sit: Mod assist, +2 for physical assistance, +2 for safety/equipment     General bed mobility comments: cues needed    Transfers Overall transfer level: Needs assistance Equipment used: Rolling walker (2 wheels) Transfers: Sit to/from Stand, Bed to chair/wheelchair/BSC Sit to Stand: Min assist, +2 physical assistance, +2 safety/equipment     Step pivot transfers: Min assist, +2 physical assistance, +2 safety/equipment            Balance Overall balance assessment: Needs assistance Sitting-balance support: Feet supported Sitting balance-Leahy Scale: Fair     Standing balance support: Bilateral upper extremity supported, During functional activity Standing balance-Leahy Scale: Poor                             ADL either performed or assessed with clinical judgement   ADL Overall ADL's : Needs assistance/impaired Eating/Feeding: Sitting;Minimal assistance   Grooming: Moderate assistance;Sitting   Upper Body Bathing: Moderate assistance;Sitting   Lower Body Bathing: Maximal assistance;Sit to/from stand   Upper Body Dressing : Moderate assistance;Sitting   Lower Body Dressing: Maximal assistance;Sit to/from stand   Toilet Transfer: Minimal assistance;+2 for safety/equipment;+2 for physical assistance;Rolling walker (2 wheels)   Toileting- Clothing Manipulation and Hygiene: Maximal assistance;Sit to/from stand  Functional mobility during ADLs: Minimal  assistance;+2 for physical assistance;+2 for safety/equipment;Rolling walker (2 wheels) General ADL Comments: cues needed for all tasks, min A +2 for     Vision Baseline Vision/History: 0 No visual deficits Vision Assessment?: No apparent visual deficits     Perception Perception Perception Tested?: No   Praxis Praxis Praxis tested?: Not tested    Pertinent Vitals/Pain Pain Assessment Pain Assessment: Faces Faces Pain Scale: Hurts little more Pain Location: LLE Pain Descriptors / Indicators: Moaning, Grimacing Pain Intervention(s): Limited activity within patient's tolerance, Monitored during session     Hand Dominance     Extremity/Trunk Assessment Upper Extremity Assessment Upper Extremity Assessment: Generalized weakness   Lower Extremity Assessment Lower Extremity Assessment: Defer to PT evaluation   Cervical / Trunk Assessment Cervical / Trunk Assessment: Kyphotic (mild)   Communication Communication Communication: HOH   Cognition Arousal/Alertness: Awake/alert Behavior During Therapy: WFL for tasks assessed/performed Overall Cognitive Status: History of cognitive impairments - at baseline                                 General Comments: advanced dementia, oriented to first name only.     General Comments  HR to 130, O2 to 89% on RA    Exercises     Shoulder Instructions      Home Living Family/patient expects to be discharged to:: Skilled nursing facility                                 Additional Comments: Pt at memory care at [facility] ~3 days prior to fall.      Prior Functioning/Environment Prior Level of Function : Patient poor historian/Family not available             Mobility Comments: Per chart review pt was previously ADLs Comments: unsure, anticipate assist from staff at [facility]        OT Problem List: Decreased strength;Decreased range of motion;Decreased activity tolerance;Impaired balance  (sitting and/or standing);Impaired UE functional use;Pain      OT Treatment/Interventions: Self-care/ADL training;Therapeutic exercise;DME and/or AE instruction;Therapeutic activities;Patient/family education;Balance training    OT Goals(Current goals can be found in the care plan section) Acute Rehab OT Goals Patient Stated Goal: unable to state OT Goal Formulation: With patient Time For Goal Achievement: 10/29/22 Potential to Achieve Goals: Good ADL Goals Pt Will Perform Grooming: sitting;with min guard assist Pt Will Perform Upper Body Dressing: with set-up;sitting Pt Will Perform Lower Body Dressing: with min assist;sit to/from stand Pt Will Transfer to Toilet: with min assist;ambulating  OT Frequency: Min 2X/week    Co-evaluation              AM-PAC OT "6 Clicks" Daily Activity     Outcome Measure Help from another person eating meals?: A Little Help from another person taking care of personal grooming?: A Lot Help from another person toileting, which includes using toliet, bedpan, or urinal?: A Lot Help from another person bathing (including washing, rinsing, drying)?: A Lot Help from another person to put on and taking off regular upper body clothing?: A Lot Help from another person to put on and taking off regular lower body clothing?: A Lot 6 Click Score: 13   End of Session Equipment Utilized During Treatment: Gait belt;Rolling walker (2 wheels) Nurse Communication: Mobility status  Activity Tolerance: Patient tolerated treatment well Patient left: in  chair;with call bell/phone within reach;with chair alarm set  OT Visit Diagnosis: Unsteadiness on feet (R26.81);Other abnormalities of gait and mobility (R26.89);Muscle weakness (generalized) (M62.81);Pain;Repeated falls (R29.6);History of falling (Z91.81)                Time: [id number] OT Time Calculation (min): 21 min Charges:  OT General Charges $OT Visit: 1 Visit OT Evaluation $OT Eval Moderate Complexity: [facility], OTR/L [facility] Office [place] Communication Preferred   Elliot Cousin 10/15/2022, 12:24 PM

## 2022-10-15 NOTE — TOC Progression Note (Signed)
Transition of Care [facility]) - Progression Note    Patient Details  Name: Emily Dawson MRN: [id number] Date of Birth: 1933-10-12  [facility] CM/SW Contact  Carles Collet, RN Phone Number: 10/15/2022, 10:26 AM  Clinical Narrative:     CSW to facilitate discharge for patient to return to [facility] with continuation of care with [facility] home [facility].  I have notified [facility] liaison that patient will need oxygen set up at facility today. Oxygen will need to be delivered to facility prior to scheduling transportation back.        Expected Discharge Plan and Services         Expected Discharge Date: 10/15/22               DME Arranged: Oxygen   Date DME Agency Contacted: 10/15/22 Time DME Agency Contacted: [id number] Representative spoke with at DME Agency: Darlington Love             Social Determinants of Health ([place]) Interventions SDOH Screenings   Depression (PHQ2-9): Low Risk  (07/11/2022)  Tobacco Use: Low Risk  (10/13/2022)    Readmission Risk Interventions     No data to display

## 2022-10-15 NOTE — Progress Notes (Addendum)
Per MD, pt medically stable for dc. Spoke to Verdi, RN/Memory Care Director at [facility] who reports they are unable to accept pt back until Monday due to weekend staffing and no RN onsite. Mariah aware [facility] is planning to have O2 delivered to [facility] today. MD updated.    UPDATE 1200: spoke to pt's dtr [name] at bedside re dc plan. Dtr is requesting PT/OT evals be complete in order to determine appropriate level of care. Discussed possible recommendation for SNF and dtr is agreeable to this option. Dtr aware [facility] hosp[facility] services will need to be revoked in order to pursue SNF under Medicare benefits. Reviewed SNF placement process and answered questions. Dtr requesting [facility], [facility], or [facility]. PT/OT pending at this time. MD updated.    UPDATE 1400: PT/OT recommendation for SNF noted. SNF search started, will f/u with offers as available.   Wandra Feinstein, MSW, LCSW [phone] (coverage)

## 2022-10-15 NOTE — Evaluation (Signed)
Physical Therapy Evaluation  Patient Details Name: Emily Dawson MRN: [id number] DOB: 1934/01/09 Today's Date: 10/15/2022  History of Present Illness  Pt is an 89 y/o female who presents s/p unwitnessed fall at memory care ([facility]) where she moved to about 3 days PTA. She sustained a L femoral neck fx and is now s/p L total hip replacement (anterior approach) on 10/13/2022. PMH significant for Alzheimer's dementia, Breast CA s/p bilateral mastectomy), CKD, glaucoma, HTN, osteoporosis, retinal vein occlusion L eye, thyroid disease.   Clinical Impression  Pt admitted with above diagnosis. Pt currently with functional limitations due to the deficits listed below (see PT Problem List). At the time of PT eval pt was able to perform transfers with up to +2 mod assist and RW for support. Pt with poor recall of recent events, unaware that she had surgery. Anticipate pt will progress well with continued skilled PT services at d/c. Acutely, pt will benefit from skilled PT to increase their independence and safety with mobility to allow discharge to the venue listed below.         Recommendations for follow up therapy are one component of a multi-disciplinary discharge planning process, led by the attending physician.  Recommendations may be updated based on patient status, additional functional criteria and insurance authorization.  Follow Up Recommendations Skilled nursing-short term rehab (<3 hours/day) Can patient physically be transported by private vehicle: Yes    Assistance Recommended at Discharge Frequent or constant Supervision/Assistance  Patient can return home with the following  A little help with bathing/dressing/bathroom;Assistance with cooking/housework;Assist for transportation;A lot of help with walking and/or transfers    Equipment Recommendations None recommended by PT (TBD by next venue of care)  Recommendations for Other Services       Functional Status Assessment Patient  has had a recent decline in their functional status and demonstrates the ability to make significant improvements in function in a reasonable and predictable amount of time.     Precautions / Restrictions Precautions Precautions: Fall Precaution Comments: advanced dementia Restrictions Weight Bearing Restrictions: No      Mobility  Bed Mobility Overal bed mobility: Needs Assistance Bed Mobility: Supine to Sit     Supine to sit: Mod assist, +2 for physical assistance, +2 for safety/equipment     General bed mobility comments: VC's for sequencing and using railings for support. Pt not following cues and holding to therapist's hands instead. Bed pad utilized for scoot out fully to EOB.    Transfers Overall transfer level: Needs assistance Equipment used: Rolling walker (2 wheels), 1 person hand held assist Transfers: Sit to/from Stand, Bed to chair/wheelchair/BSC Sit to Stand: Min assist, +2 physical assistance, +2 safety/equipment   Step pivot transfers: Min assist, +2 physical assistance, +2 safety/equipment       General transfer comment: Initially pt stood EOB without RW and therapist assisting for balance support. With walker, pt appeared increasingly confused with where to place her hands. Hand over hand assist to position hands on the walker. She was able to take pivotal steps around to the recliner to sit but appeared limited by pain initially.    Ambulation/Gait               General Gait Details: Unable to progress to gait training this session.  Stairs            Wheelchair Mobility    Modified Rankin (Stroke Patients Only)       Balance Overall balance assessment: Needs assistance Sitting-balance support:  Feet supported Sitting balance-Leahy Scale: Fair     Standing balance support: Bilateral upper extremity supported, During functional activity Standing balance-Leahy Scale: Poor                               Pertinent  Vitals/Pain Pain Assessment Pain Assessment: Faces Faces Pain Scale: Hurts little more Pain Location: LLE Pain Descriptors / Indicators: Operative site guarding, Grimacing, Moaning Pain Intervention(s): Limited activity within patient's tolerance, Monitored during session, Patient requesting pain meds-RN notified    Home Living Family/patient expects to be discharged to:: Skilled nursing facility                   Additional Comments: Pt at memory care at [facility] ~3 days prior to fall.    Prior Function Prior Level of Function : Patient poor historian/Family not available             Mobility Comments: Per chart review at baseline pt was ambulatory and up to 80% of the day ambulating without assistance. ADLs Comments: unsure, anticipate assist from staff at [facility]        Extremity/Trunk Assessment   Upper Extremity Assessment Upper Extremity Assessment: Generalized weakness    Lower Extremity Assessment Lower Extremity Assessment: Generalized weakness;LLE deficits/detail LLE Deficits / Details: Generalized weakness bilaterally however now with acute pain, decreased strength and AROM consistent with hip surgery. LLE: Unable to fully assess due to pain    Cervical / Trunk Assessment Cervical / Trunk Assessment: Kyphotic  Communication   Communication: HOH  Cognition Arousal/Alertness: Awake/alert Behavior During Therapy: WFL for tasks assessed/performed Overall Cognitive Status: History of cognitive impairments - at baseline                                 General Comments: advanced dementia, oriented to first name only.        General Comments General comments (skin integrity, edema, etc.): HR to 130, O2 to 89% on RA    Exercises General Exercises - Lower Extremity Long Arc Quad: 10 reps (decreased ROM, pt swinging foot up and down. Unable to follow commands for improved form.)   Assessment/Plan    PT  Assessment Patient needs continued PT services  PT Problem List Decreased strength;Decreased range of motion;Decreased activity tolerance;Decreased balance;Decreased mobility;Decreased knowledge of use of DME;Decreased safety awareness;Decreased knowledge of precautions;Pain       PT Treatment Interventions DME instruction;Gait training;Stair training;Functional mobility training;Therapeutic activities;Therapeutic exercise;Balance training;Patient/family education;Cognitive remediation    PT Goals (Current goals can be found in the Care Plan section)  Acute Rehab PT Goals Patient Stated Goal: None stated PT Goal Formulation: With patient Time For Goal Achievement: 10/29/22 Potential to Achieve Goals: Good    Frequency Min 5X/week     Co-evaluation               AM-PAC PT "6 Clicks" Mobility  Outcome Measure Help needed turning from your back to your side while in a flat bed without using bedrails?: A Lot Help needed moving from lying on your back to sitting on the side of a flat bed without using bedrails?: A Lot Help needed moving to and from a bed to a chair (including a wheelchair)?: A Lot Help needed standing up from a chair using your arms (e.g., wheelchair or bedside chair)?: A Lot Help needed to  walk in hospital room?: Total Help needed climbing 3-5 steps with a railing? : Total 6 Click Score: 10    End of Session Equipment Utilized During Treatment: Gait belt Activity Tolerance: Patient tolerated treatment well Patient left: in chair;with call bell/phone within reach;with chair alarm set Nurse Communication: Mobility status PT Visit Diagnosis: Unsteadiness on feet (R26.81);Pain;Difficulty in walking, not elsewhere classified (R26.2) Pain - Right/Left: Left Pain - part of body: Hip    Time: [id number] PT Time Calculation (min) (ACUTE ONLY): 24 min   Charges:   PT Evaluation $PT Eval Moderate Complexity: 1 Mod          Rolinda Roan, PT, DPT Acute  Rehabilitation Services Secure Chat Preferred Office: [phone]   Thelma Comp 10/15/2022, 12:46 PM

## 2022-10-15 NOTE — Discharge Summary (Signed)
Physician Discharge Summary  Emily Dawson [id number] DOB: 1934/05/12 DOA: 10/13/2022  PCP: Edmonia Caprio, NP  Admit date: 10/13/2022 Discharge date: 10/15/2022  Admitted From: ALF Disposition: ALF with hospice  Recommendations for Outpatient Follow-up:  Follow up with PCP in 1 week  Outpatient follow-up with hospice at earliest convenience Outpatient follow-up with orthopedics.  Discharge pain management/DVT prophylaxis/wound care as per orthopedics recommendations Follow up in ED if symptoms worsen or new appear   Home Health: No Equipment/Devices: Oxygen via nasal cannula at 1 L/min; Foley catheter  Discharge Condition: Poor  CODE STATUS: DNR Diet recommendation: Heart healthy/as per comfort measures  Brief/Interim Summary: 89 year old Alzheimer's dementia, HTN, CKD, breast cancer status post bilateral mastectomy, arthritis, glaucoma admitted after unwitnessed fall. In the ED CT of the head and cervical spine did not show acute pathology, chest x-ray showed mild increased coarse bilateral infiltrate. X-ray of the hip showed nondisplaced fracture of the left femoral neck, orthopedic was consulted. Patient underwent left total hip replacement by Dr. Alvan Dame on 2/8.  Because of very poor prognosis, family has subsequently agreed that patient will return to ALF with hospice following.  Discharge to ALF with hospice today.  Discharge Diagnoses:   Left femoral neck fracture secondary to fall Status post total hip replacement on 2/8 -Unwitnessed fall.  CT head and cervical spine negative.  Suffered from left femoral neck fracture and orthopedic performed total hip replacement on 2/8.  Discharge pain management, postop DVT prophylaxis prophylaxis, surgical site management/wound care as per orthopedics recommendations.  Outpatient follow-up with orthopedics.  -Continue bowel regimen -Because of very poor prognosis, family has subsequently agreed that patient will return to ALF with  hospice following.  Discharge to ALF with hospice today.   Leukocytosis Abnormal chest x-ray I suspect reactive in nature.  UA is negative, chest x-ray did not show infiltrate.  Flu/RSV/COVID-negative.  Procalcitonin negative.  WBC slightly elevated but improving.  Monitor off antibiotics.  Dehydration -Treated with IV fluids.  Encourage oral intake.  Urinary retention, acute -800 cc of urinary retention in the ED.  Foley catheter in place.  Continue Foley catheter on discharge for comfort measures   Chronic kidney disease stage II Stable.  Creatinine stable   Hypokalemia Replace prior to discharge  Normocytic anemia -Possibly from chronic illnesses and hip fracture.  Hemoglobin currently stable  Thrombocytopenia -Mild.  Essential hypertension - Blood pressure on the high side.  Continue Toprol-XL.  Add amlodipine  Alzheimer's dementia, advanced Goals of care -Supportive care, continue Namenda -Because of very poor prognosis, family has subsequently agreed that patient will return to ALF with hospice following.  Discharge Instructions  Discharge Instructions     Diet - low sodium heart healthy   Complete by: As directed    Increase activity slowly   Complete by: As directed       Allergies as of 10/15/2022       Reactions   Nsaids    Ampicillin    Arimidex [anastrozole] Other (See Comments)   Kidney problems    Azithromycin    Celecoxib    Fosamax [alendronate] Other (See Comments)   Kidney problem    Ibuprofen    Naproxen    Penicillins    Sulfa Antibiotics    Tetracyclines & Related    Losartan Rash        Medication List     TAKE these medications    acetaminophen 500 MG tablet Commonly known as: TYLENOL Take 1 tablet (500 mg total) by  mouth every 8 (eight) hours as needed for moderate pain. What changed: when to take this   amLODipine 5 MG tablet Commonly known as: NORVASC Take 1 tablet (5 mg total) by mouth daily.   b complex vitamins  capsule Take 1 capsule by mouth daily.   docusate sodium 100 MG capsule Commonly known as: COLACE Take 1 capsule (100 mg total) by mouth 2 (two) times daily.   dorzolamidel-timolol 22.3-6.8 MG/ML Soln ophthalmic solution Commonly known as: COSOPT Place 1 drop into both eyes 2 (two) times daily.   latanoprost 0.005 % ophthalmic solution Commonly known as: XALATAN Place 1 drop into both eyes at bedtime.   melatonin 3 MG Tabs tablet Take 3 mg by mouth at bedtime.   memantine 5 MG tablet Commonly known as: NAMENDA Take 5 mg by mouth at bedtime.   metoprolol succinate 100 MG 24 hr tablet Commonly known as: TOPROL-XL Take 1 tablet (100 mg total) by mouth daily. TAKE WITH OR IMMEDIATELY FOLLOWING A MEAL.   mineral oil-hydrophilic petrolatum ointment Apply topically as needed for dry skin. Both legs What changed:  how much to take additional instructions   polyethylene glycol 17 g packet Commonly known as: MIRALAX / GLYCOLAX Take 17 g by mouth daily as needed for moderate constipation.   QUEtiapine 25 MG tablet Commonly known as: SEROQUEL Take 12.5 mg by mouth at bedtime.   Refresh Optive Advanced PF 0.5-1-0.5 % Soln Generic drug: Carboxymeth-Glyc-Polysorb PF Place 1 drop into both eyes at bedtime.   VITAMIN D (CHOLECALCIFEROL) PO Take 2,000 Units by mouth daily.        Follow-up Information     Paralee Cancel, MD. Schedule an appointment as soon as possible for a visit in 1 week(s).   Specialty: Orthopedic Surgery Contact information: [address] [place] [address] [phone]                Allergies  Allergen Reactions   Nsaids    Ampicillin    Arimidex [Anastrozole] Other (See Comments)    Kidney problems    Azithromycin    Celecoxib    Fosamax [Alendronate] Other (See Comments)    Kidney problem    Ibuprofen    Naproxen    Penicillins    Sulfa Antibiotics    Tetracyclines & Related    Losartan Rash     Consultations: Orthopedics   Procedures/Studies: DG Pelvis Portable  Result Date: 10/13/2022 CLINICAL DATA:  Status post left total hip arthroplasty. EXAM: PORTABLE PELVIS 1-2 VIEWS COMPARISON:  Radiographs of same day. FINDINGS: The left femoral and acetabular components are well situated. Expected postoperative changes seen in the surrounding soft tissues. IMPRESSION: Status post left total hip arthroplasty. Electronically Signed   By: Marijo Conception M.D.   On: 10/13/2022 15:24   DG C-Arm 1-60 Min-No Report  Result Date: 10/13/2022 Fluoroscopy was utilized by the requesting physician.  No radiographic interpretation.   DG Chest Port 1 View  Result Date: 10/13/2022 CLINICAL DATA:  89 year old female status post fall with left femoral neck fracture. EXAM: PORTABLE CHEST 1 VIEW COMPARISON:  Left hip series 0305 hours today. Cervical spine CT today. FINDINGS: Portable AP supine view at 0402 hours. Lung volumes and cardiac size are at the upper limits of normal. Other mediastinal contours are within normal limits. Visualized tracheal air column is within normal limits. Right greater than left axillary surgical clips. No pneumothorax, pleural effusion or confluent lung opacity. Diffuse mildly increased and coarse bilateral pulmonary  interstitial opacity is nonspecific. Lung apices on cervical spine CT today demonstrated only mild scarring. Oval osseous structures appear intact. IMPRESSION: Nonspecific mildly increased, coarse bilateral pulmonary interstitial opacity with no previous exams. These could be chronic interstitial lung changes. In the appropriate setting of viral/atypical respiratory infection would be difficult to exclude. But otherwise no acute cardiopulmonary abnormality. Electronically Signed   By: Genevie Ann M.D.   On: 10/13/2022 04:16   CT Cervical Spine Wo Contrast  Result Date: 10/13/2022 CLINICAL DATA:  Neck trauma (Age >= 65y).  Unwitnessed fall EXAM: CT CERVICAL SPINE WITHOUT  CONTRAST TECHNIQUE: Multidetector CT imaging of the cervical spine was performed without intravenous contrast. Multiplanar CT image reconstructions were also generated. RADIATION DOSE REDUCTION: This exam was performed according to the departmental dose-optimization program which includes automated exposure control, adjustment of the mA and/or kV according to patient size and/or use of iterative reconstruction technique. COMPARISON:  None Available. FINDINGS: Alignment: Normal Skull base and vertebrae: No acute fracture. No primary bone lesion or focal pathologic process. Soft tissues and spinal canal: No prevertebral fluid or swelling. No visible canal hematoma. Disc levels: Degenerative disc disease at C5-6 and C6-7 with disc space narrowing and spurring. Moderate bilateral degenerative facet disease, right greater than left. Fusion across the right C2-3 facet. Upper chest: Biapical scarring.  No acute findings. Other: None IMPRESSION: Degenerative disc and facet disease.  No acute bony abnormality. Electronically Signed   By: Rolm Baptise M.D.   On: 10/13/2022 03:31   CT Head Wo Contrast  Result Date: 10/13/2022 CLINICAL DATA:  Head trauma, minor (Age >= 65y).  Fall. EXAM: CT HEAD WITHOUT CONTRAST TECHNIQUE: Contiguous axial images were obtained from the base of the skull through the vertex without intravenous contrast. RADIATION DOSE REDUCTION: This exam was performed according to the departmental dose-optimization program which includes automated exposure control, adjustment of the mA and/or kV according to patient size and/or use of iterative reconstruction technique. COMPARISON:  None Available. FINDINGS: Brain: There is atrophy and chronic small vessel disease changes. No acute intracranial abnormality. Specifically, no hemorrhage, hydrocephalus, mass lesion, acute infarction, or significant intracranial injury. Vascular: No hyperdense vessel or unexpected calcification. Skull: No acute calvarial  abnormality. Sinuses/Orbits: No acute findings Other: None IMPRESSION: Atrophy, chronic microvascular disease. No acute intracranial abnormality. Electronically Signed   By: Rolm Baptise M.D.   On: 10/13/2022 03:29   DG Hip Unilat W or Wo Pelvis 2-3 Views Left  Result Date: 10/13/2022 CLINICAL DATA:  Unwitnessed fall EXAM: DG HIP (WITH OR WITHOUT PELVIS) 2-3V LEFT COMPARISON:  None Available. FINDINGS: Degenerative changes in the left hip with joint space narrowing and sclerosis and osteophyte formation. Mild deformity of the left hip with cortical irregularity along the femoral neck likely represents an impacted acute fracture. No inter trochanteric involvement is suggested. Pelvis and sacrum appear intact. Surgical clips in the pelvis. IMPRESSION: Nondisplaced impacted fracture of the left femoral neck. Degenerative changes in the left hip. Electronically Signed   By: Lucienne Capers M.D.   On: 10/13/2022 03:24      Subjective: Patient seen and examined at bedside.  Sleepy, wakes up slightly, confused.  No fever, vomiting, agitation reported.  Discharge Exam: Vitals:   10/15/22 0433 10/15/22 0731  BP: (!) 182/90 (!) 195/101  Pulse: (!) 101 (!) 110  Resp: 20   Temp: 97.8 F (36.6 C) 98.5 F (36.9 C)  SpO2:  98%    General: No distress.  Clinical ill and deconditioned.  Elderly female  lying in bed.  Wakes up only very slightly, hardly answers any questions, confused. Cardiovascular: Tachycardic, S1/S2 + Respiratory: bilateral decreased breath sounds at bases with some scattered crackles Abdominal: Soft, NT, ND, bowel sounds + Extremities: Trace lower extremity edema, no cyanosis    The results of significant diagnostics from this hospitalization (including imaging, microbiology, ancillary and laboratory) are listed below for reference.     Microbiology: Recent Results (from the past 240 hour(s))  Surgical pcr screen     Status: None   Collection Time: 10/13/22  6:17 AM    Specimen: Nasal Mucosa; Nasal Swab  Result Value Ref Range Status   MRSA, PCR NEGATIVE NEGATIVE Final   Staphylococcus aureus NEGATIVE NEGATIVE Final    Comment: (NOTE) The Xpert SA Assay (FDA approved for NASAL specimens in patients 66 years of age and older), is one component of a comprehensive surveillance program. It is not intended to diagnose infection nor to guide or monitor treatment. Performed at [facility], [place] [address], [place], [place] [address]   Resp panel by RT-PCR (RSV, Flu A&B, Covid) Anterior Nasal Swab     Status: None   Collection Time: 10/13/22  7:24 AM   Specimen: Anterior Nasal Swab  Result Value Ref Range Status   SARS Coronavirus 2 by RT PCR NEGATIVE NEGATIVE Final   Influenza A by PCR NEGATIVE NEGATIVE Final   Influenza B by PCR NEGATIVE NEGATIVE Final    Comment: (NOTE) The Xpert Xpress SARS-CoV-2/FLU/RSV plus assay is intended as an aid in the diagnosis of influenza from Nasopharyngeal swab specimens and should not be used as a sole basis for treatment. Nasal washings and aspirates are unacceptable for Xpert Xpress SARS-CoV-2/FLU/RSV testing.  Fact Sheet for Patients: EntrepreneurPulse.com.au  Fact Sheet for Healthcare Providers: IncredibleEmployment.be  This test is not yet approved or cleared by the [place] FDA and has been authorized for detection and/or diagnosis of SARS-CoV-2 by FDA under an Emergency Use Authorization (EUA). This EUA will remain in effect (meaning this test can be used) for the duration of the COVID-19 declaration under Section 564(b)(1) of the Act, 21 U.S.C. section 360bbb-3(b)(1), unless the authorization is terminated or revoked.     Resp Syncytial Virus by PCR NEGATIVE NEGATIVE Final    Comment: (NOTE) Fact Sheet for Patients: EntrepreneurPulse.com.au  Fact Sheet for Healthcare Providers: IncredibleEmployment.be  This test is not  yet approved or cleared by the [place] FDA and has been authorized for detection and/or diagnosis of SARS-CoV-2 by FDA under an Emergency Use Authorization (EUA). This EUA will remain in effect (meaning this test can be used) for the duration of the COVID-19 declaration under Section 564(b)(1) of the Act, 21 U.S.C. section 360bbb-3(b)(1), unless the authorization is terminated or revoked.  Performed at [facility], [place] [address] [place], [place] [address]      Labs: BNP (last 3 results) No results for input(s): "BNP" in the last 8760 hours. Basic Metabolic Panel: Recent Labs  Lab 10/13/22 0247 10/14/22 0412 10/15/22 0237  NA 140 141 144  K 3.7 3.5 3.1*  CL 107 103 111  CO2 24 21* 24  GLUCOSE 138* 150* 109*  BUN 37* 28* 27*  CREATININE 1.00 1.09* 0.82  CALCIUM 9.3 8.7* 8.2*  MG  --   --  2.1   Liver Function Tests: No results for input(s): "AST", "ALT", "ALKPHOS", "BILITOT", "PROT", "ALBUMIN" in the last 168 hours. No results for input(s): "LIPASE", "AMYLASE" in the last 168 hours.  No results for input(s): "AMMONIA" in the last 168 hours. CBC: Recent Labs  Lab 10/13/22 0247 10/14/22 0412 10/15/22 0237  WBC 18.0* 16.2* 13.5*  NEUTROABS 14.6*  --   --   HGB 13.5 12.0 10.7*  HCT 38.8 36.1 29.7*  MCV 95.3 95.5 93.1  PLT 196 181 143*   Cardiac Enzymes: No results for input(s): "CKTOTAL", "CKMB", "CKMBINDEX", "TROPONINI" in the last 168 hours. BNP: Invalid input(s): "POCBNP" CBG: No results for input(s): "GLUCAP" in the last 168 hours. D-Dimer No results for input(s): "DDIMER" in the last 72 hours. Hgb A1c No results for input(s): "HGBA1C" in the last 72 hours. Lipid Profile No results for input(s): "CHOL", "HDL", "LDLCALC", "TRIG", "CHOLHDL", "LDLDIRECT" in the last 72 hours. Thyroid function studies No results for input(s): "TSH", "T4TOTAL", "T3FREE", "THYROIDAB" in the last 72 hours.  Invalid input(s): "FREET3" Anemia work up No results for  input(s): "VITAMINB12", "FOLATE", "FERRITIN", "TIBC", "IRON", "RETICCTPCT" in the last 72 hours. Urinalysis    Component Value Date/Time   COLORURINE STRAW (A) 10/13/2022 0815   APPEARANCEUR CLEAR 10/13/2022 0815   LABSPEC 1.012 10/13/2022 0815   PHURINE 7.0 10/13/2022 0815   GLUCOSEU 50 (A) 10/13/2022 0815   HGBUR NEGATIVE 10/13/2022 0815   BILIRUBINUR NEGATIVE 10/13/2022 0815   BILIRUBINUR Negative 03/19/2021 1449   KETONESUR 20 (A) 10/13/2022 0815   PROTEINUR 30 (A) 10/13/2022 0815   UROBILINOGEN negative (A) 03/19/2021 1449   NITRITE NEGATIVE 10/13/2022 0815   LEUKOCYTESUR NEGATIVE 10/13/2022 0815   Sepsis Labs Recent Labs  Lab 10/13/22 0247 10/14/22 0412 10/15/22 0237  WBC 18.0* 16.2* 13.5*   Microbiology Recent Results (from the past 240 hour(s))  Surgical pcr screen     Status: None   Collection Time: 10/13/22  6:17 AM   Specimen: Nasal Mucosa; Nasal Swab  Result Value Ref Range Status   MRSA, PCR NEGATIVE NEGATIVE Final   Staphylococcus aureus NEGATIVE NEGATIVE Final    Comment: (NOTE) The Xpert SA Assay (FDA approved for NASAL specimens in patients 71 years of age and older), is one component of a comprehensive surveillance program. It is not intended to diagnose infection nor to guide or monitor treatment. Performed at [facility], [place] [address] [place], [place] [address]   Resp panel by RT-PCR (RSV, Flu A&B, Covid) Anterior Nasal Swab     Status: None   Collection Time: 10/13/22  7:24 AM   Specimen: Anterior Nasal Swab  Result Value Ref Range Status   SARS Coronavirus 2 by RT PCR NEGATIVE NEGATIVE Final   Influenza A by PCR NEGATIVE NEGATIVE Final   Influenza B by PCR NEGATIVE NEGATIVE Final    Comment: (NOTE) The Xpert Xpress SARS-CoV-2/FLU/RSV plus assay is intended as an aid in the diagnosis of influenza from Nasopharyngeal swab specimens and should not be used as a sole basis for treatment. Nasal washings and aspirates are unacceptable for  Xpert Xpress SARS-CoV-2/FLU/RSV testing.  Fact Sheet for Patients: EntrepreneurPulse.com.au  Fact Sheet for Healthcare Providers: IncredibleEmployment.be  This test is not yet approved or cleared by the [place] FDA and has been authorized for detection and/or diagnosis of SARS-CoV-2 by FDA under an Emergency Use Authorization (EUA). This EUA will remain in effect (meaning this test can be used) for the duration of the COVID-19 declaration under Section 564(b)(1) of the Act, 21 U.S.C. section 360bbb-3(b)(1), unless the authorization is terminated or revoked.     Resp Syncytial Virus by PCR NEGATIVE NEGATIVE Final    Comment: (NOTE)  Fact Sheet for Patients: EntrepreneurPulse.com.au  Fact Sheet for Healthcare Providers: IncredibleEmployment.be  This test is not yet approved or cleared by the [place] FDA and has been authorized for detection and/or diagnosis of SARS-CoV-2 by FDA under an Emergency Use Authorization (EUA). This EUA will remain in effect (meaning this test can be used) for the duration of the COVID-19 declaration under Section 564(b)(1) of the Act, 21 U.S.C. section 360bbb-3(b)(1), unless the authorization is terminated or revoked.  Performed at [facility], [place] [address] [place], [place] [address]      Time coordinating discharge: 35 minutes  SIGNED:   Aline August, MD  [facility] 10/15/2022, 10:04 AM

## 2022-10-15 NOTE — NC FL2 (Signed)
[place] LEVEL OF CARE FORM     IDENTIFICATION  Patient Name: Emily Dawson Birthdate: [birth year] Sex: female Admission Date (Current Location): 10/13/2022  [facility] and [place] Number:  Herbalist and Address:  [facility] [place], [place] [address] [place], [place] [address]      Provider Number: [id number]  Attending Physician Name and Address:  Aline August, MD  Relative Name and Phone Number:       Current Level of Care: SNF Recommended Level of Care: [place] Prior Approval Number:    Date Approved/Denied:   PASRR Number: [id number]  Discharge Plan: SNF    Current Diagnoses: Patient Active Problem List   Diagnosis Date Noted   Left displaced femoral neck fracture ([place]) 10/13/2022   Falls, initial encounter 10/13/2022   Leukocytosis 10/13/2022   Abnormal chest x-ray 10/13/2022   Dehydration 10/13/2022   Acute urinary retention 10/13/2022   S/P total left hip arthroplasty 10/13/2022   Palliative care encounter 05/10/2022   Osteoporosis    Vitamin D deficiency    Dementia ([place])    Hypertension    CKD (chronic kidney disease) stage 3, GFR 30-59 ml/min (HCC)    Low TSH level     Orientation RESPIRATION BLADDER Height & Weight     Self  Normal Indwelling catheter, Continent Weight: 88 lb 2.9 oz (40 kg) Height:  5' (152.4 cm)  BEHAVIORAL SYMPTOMS/MOOD NEUROLOGICAL BOWEL NUTRITION STATUS      Continent    AMBULATORY STATUS COMMUNICATION OF NEEDS Skin   Extensive Assist Verbally Surgical wounds                       Personal Care Assistance Level of Assistance  Bathing, Feeding, Dressing Bathing Assistance: Maximum assistance Feeding assistance: Limited assistance Dressing Assistance: Maximum assistance     Functional Limitations Info  Sight, Hearing, Speech Sight Info: Adequate Hearing Info: Adequate Speech Info: Adequate    SPECIAL CARE FACTORS FREQUENCY  PT (By licensed PT), OT (By  licensed OT)                    Contractures Contractures Info: Not present    Additional Factors Info  Code Status Code Status Info: DNR             Current Medications (10/15/2022):  This is the current hospital active medication list Current Facility-Administered Medications  Medication Dose Route Frequency Provider Last Rate Last Admin   acetaminophen (TYLENOL) tablet 650 mg  650 mg Oral Q6H PRN Irving Copas, PA-C   650 mg at 10/15/22 1322   Or   acetaminophen (TYLENOL) suppository 650 mg  650 mg Rectal Q6H PRN Irving Copas, PA-C       acetaminophen (TYLENOL) tablet 325-650 mg  325-650 mg Oral Q6H PRN Irving Copas, PA-C       amLODipine (NORVASC) tablet 5 mg  5 mg Oral Daily Alekh, Kshitiz, MD   5 mg at 10/15/22 1034   aspirin chewable tablet 81 mg  81 mg Oral BID Irving Copas, PA-C   81 mg at 10/15/22 1034   bisacodyl (DULCOLAX) suppository 10 mg  10 mg Rectal Daily PRN Irving Copas, PA-C       Chlorhexidine Gluconate Cloth 2 % PADS 6 each  6 each Topical Q0600 Irving Copas, PA-C   6 each at 10/14/22 [id number]   diphenhydrAMINE (BENADRYL) 12.5 MG/5ML elixir 12.5-25 mg  12.5-25  mg Oral Q4H PRN Irving Copas, PA-C       docusate sodium (COLACE) capsule 100 mg  100 mg Oral BID Irving Copas, PA-C   100 mg at 10/15/22 1034   guaiFENesin (ROBITUSSIN) 100 MG/5ML liquid 5 mL  5 mL Oral Q4H PRN Amin, Ankit Chirag, MD       hydrALAZINE (APRESOLINE) injection 10 mg  10 mg Intravenous Q4H PRN Amin, Ankit Chirag, MD       HYDROcodone-acetaminophen (NORCO/VICODIN) 5-325 MG per tablet 1 tablet  1 tablet Oral Q4H PRN Irving Copas, PA-C       ipratropium-albuterol (DUONEB) 0.5-2.5 (3) MG/3ML nebulizer solution 3 mL  3 mL Nebulization Q4H PRN Amin, Ankit Chirag, MD       melatonin tablet 3 mg  3 mg Oral QHS Amin, Ankit Chirag, MD       memantine (NAMENDA) tablet 5 mg  5 mg Oral QHS Amin, Ankit Chirag, MD       menthol-cetylpyridinium (CEPACOL) lozenge  3 mg  1 lozenge Oral PRN Irving Copas, PA-C       Or   phenol (CHLORASEPTIC) mouth spray 1 spray  1 spray Mouth/Throat PRN Irving Copas, PA-C       methocarbamol (ROBAXIN) tablet 500 mg  500 mg Oral Q6H PRN Irving Copas, PA-C       Or   methocarbamol (ROBAXIN) 500 mg in dextrose 5 % 50 mL IVPB  500 mg Intravenous Q6H PRN Irving Copas, PA-C   Stopping previously hung infusion at 10/14/22 0235   metoCLOPramide (REGLAN) tablet 5-10 mg  5-10 mg Oral Q8H PRN Irving Copas, PA-C       Or   metoCLOPramide (REGLAN) injection 5-10 mg  5-10 mg Intravenous Q8H PRN Irving Copas, PA-C       metoprolol succinate (TOPROL-XL) 24 hr tablet 100 mg  100 mg Oral Daily Amin, Ankit Chirag, MD   100 mg at 10/15/22 1034   metoprolol tartrate (LOPRESSOR) injection 5 mg  5 mg Intravenous Q4H PRN Amin, Ankit Chirag, MD       morphine (PF) 2 MG/ML injection 0.5-1 mg  0.5-1 mg Intravenous Q2H PRN Costella Hatcher R, PA-C   1 mg at 10/14/22 0858   ondansetron (ZOFRAN) tablet 4 mg  4 mg Oral Q6H PRN Irving Copas, PA-C       Or   ondansetron (ZOFRAN) injection 4 mg  4 mg Intravenous Q6H PRN Costella Hatcher R, PA-C       polyethylene glycol (MIRALAX / GLYCOLAX) packet 17 g  17 g Oral BID Irving Copas, PA-C   17 g at 10/15/22 1035   potassium chloride SA (KLOR-CON M) CR tablet 40 mEq  40 mEq Oral Q4H Alekh, Kshitiz, MD   40 mEq at 10/15/22 1034   QUEtiapine (SEROQUEL) tablet 12.5 mg  12.5 mg Oral QHS Amin, Ankit Chirag, MD       senna-docusate (Senokot-S) tablet 1 tablet  1 tablet Oral QHS Irving Copas, PA-C   1 tablet at 10/13/22 2044   traZODone (DESYREL) tablet 50 mg  50 mg Oral QHS PRN Damita Lack, MD         Discharge Medications: Please see discharge summary for a list of discharge medications.  Relevant Imaging Results:  Relevant Lab Results:   Additional Information SS# [id number]  Amador Cunas, [place]

## 2022-10-15 NOTE — Progress Notes (Signed)
Physical Therapy Treatment Patient Details Name: Emily Dawson MRN: [id number] DOB: Apr 09, 1934 Today's Date: 10/15/2022   History of Present Illness Pt is an 89 y/o female who presents s/p unwitnessed fall at memory care ([facility]) where she moved to about 3 days PTA. She sustained a L femoral neck fx and is now s/p L total hip replacement (anterior approach) on 10/13/2022. PMH significant for Alzheimer's dementia, Breast CA s/p bilateral mastectomy), CKD, glaucoma, HTN, osteoporosis, retinal vein occlusion L eye, thyroid disease.    PT Comments    PT received word that daughter was present at bedside and had questions regarding rehab. PT returned to discuss evaluation and plan for d/c. Daughter pleasant and we discussed the patient's current level of function, PLOF, and recommendations for SNF level rehab. We had a long discussion regarding risk for falls, and daughter worried that pt will try to get up and wander at SNF if she is not monitored closely. Based on evaluation this morning, pt is safe for transfers to/from recliner and BSC with RW for support. All of daughter's questions were answered within scope of practice. Continue to recommend SNF level rehab at d/c, and anticipate pt will progress well with continued skilled PT interventions.    Recommendations for follow up therapy are one component of a multi-disciplinary discharge planning process, led by the attending physician.  Recommendations may be updated based on patient status, additional functional criteria and insurance authorization.  Follow Up Recommendations  Skilled nursing-short term rehab (<3 hours/day) Can patient physically be transported by private vehicle: Yes   Assistance Recommended at Discharge Frequent or constant Supervision/Assistance  Patient can return home with the following A little help with bathing/dressing/bathroom;Assistance with cooking/housework;Assist for transportation;A lot of help with walking and/or  transfers   Equipment Recommendations  None recommended by PT (TBD by next venue of care)    Recommendations for Other Services       Precautions / Restrictions Precautions Precautions: Fall Precaution Comments: advanced dementia Restrictions Weight Bearing Restrictions: No           Cognition Arousal/Alertness: Awake/alert Behavior During Therapy: WFL for tasks assessed/performed Overall Cognitive Status: History of cognitive impairments - at baseline General Comments: advanced dementia, oriented to first name only.     Exercises General Exercises - Lower Extremity Long Arc Quad: 10 reps (decreased ROM, pt swinging foot up and down. Unable to follow commands for improved form.)   Home Living Family/patient expects to be discharged to:: Skilled nursing facility                   Additional Comments: Pt at memory care at [facility] ~3 days prior to fall.    Prior Function            PT Goals (current goals can now be found in the care plan section) Acute Rehab PT Goals Patient Stated Goal: None stated PT Goal Formulation: With family Time For Goal Achievement: 10/29/22 Potential to Achieve Goals: Good Progress towards PT goals: Progressing toward goals    Frequency    Min 5X/week      PT Plan Current plan remains appropriate    Co-evaluation              AM-PAC PT "6 Clicks" Mobility   Outcome Measure  Help needed turning from your back to your side while in a flat bed without using bedrails?: A Lot Help needed moving from lying on your back to sitting on the side of a flat bed  without using bedrails?: A Lot Help needed moving to and from a bed to a chair (including a wheelchair)?: A Lot Help needed standing up from a chair using your arms (e.g., wheelchair or bedside chair)?: A Lot Help needed to walk in hospital room?: Total Help needed climbing 3-5 steps with a railing? : Total 6 Click Score: 10    End of Session Equipment Utilized  During Treatment: Gait belt Activity Tolerance: Patient tolerated treatment well Patient left: in chair;with call bell/phone within reach;with chair alarm set Nurse Communication: Mobility status PT Visit Diagnosis: Unsteadiness on feet (R26.81);Pain;Difficulty in walking, not elsewhere classified (R26.2) Pain - Right/Left: Left Pain - part of body: Hip     Time: [id number] PT Time Calculation (min) (ACUTE ONLY): 24 min  Charges:  $Self Care/Home Management: [place], PT, DPT Acute Rehabilitation Services Secure Chat Preferred Office: [phone]    Thelma Comp 10/15/2022, 2:32 PM

## 2022-10-16 MED ORDER — HALOPERIDOL LACTATE 5 MG/ML IJ SOLN
1.0000 mg | Freq: Four times a day (QID) | INTRAMUSCULAR | Status: DC | PRN
  Administered 2022-10-16 – 2022-10-18 (×3): 1 mg via INTRAMUSCULAR
  Filled 2022-10-16 (×2): qty 1

## 2022-10-16 MED ORDER — ASPIRIN 81 MG PO CHEW: 81.0000 mg | CHEWABLE_TABLET | Freq: Two times a day (BID) | ORAL | 0 refills | Status: DC

## 2022-10-16 MED ORDER — HALOPERIDOL LACTATE 5 MG/ML IJ SOLN
1.0000 mg | Freq: Four times a day (QID) | INTRAMUSCULAR | Status: DC | PRN
  Filled 2022-10-16: qty 1

## 2022-10-16 MED ORDER — LORAZEPAM 2 MG/ML IJ SOLN
0.5000 mg | INTRAMUSCULAR | Status: DC | PRN
  Administered 2022-10-16 – 2022-10-19 (×3): 0.5 mg via INTRAVENOUS
  Filled 2022-10-16 (×3): qty 1

## 2022-10-16 NOTE — Progress Notes (Signed)
Foley removed in earlier in the day. Patient had voided very little since. Patient bladder scanned and is retaining 154 mLs of urine. Dr. Starla Link made aware. Per Dr. Starla Link to continue to monitor urine output

## 2022-10-16 NOTE — Progress Notes (Addendum)
Patient becoming agitated at this time. Patient reorientated and placed in chair with chair alarm in place. Daughter at bedside. Patient pulling on foley. Dr. Starla Link made aware. Per Dr. Starla Link to d/c foley and haldol ordered  11:45 foley discontinued. Patient tolerated procedure well

## 2022-10-16 NOTE — Progress Notes (Signed)
Patient was supposed to be discharged to ALF with hospice on 10/15/2022 but family now wants patient to be placed to SNF for further physical therapy.  Patient currently medically stable for discharge.  Please refer to the full discharge summary done by me on 10/15/2022 for full details.  Overall prognosis is poor.  Patient seen and examined at bedside today.

## 2022-10-17 MED ORDER — HYDROCODONE-ACETAMINOPHEN 5-325 MG PO TABS: 1.0000 | ORAL_TABLET | Freq: Three times a day (TID) | ORAL | 0 refills | Status: DC | PRN

## 2022-10-17 NOTE — Progress Notes (Signed)
Patient is still waiting to be discharged to SNF.  Please refer to the full discharge summary done by me on 10/15/2022 for full details.  Patient had episodes of agitation and delirium on 10/16/2022 needing IV Haldol/Ativan.  Overall prognosis is very poor.  Patient seen and examined at bedside today.

## 2022-10-17 NOTE — Progress Notes (Signed)
Patient urinated 350 ml to the [facility] at 1130 AM . Bladder scan done at 1220PM and it was 226m .

## 2022-10-17 NOTE — Progress Notes (Addendum)
CSW spoke with [place] at [facility] who states there are no beds available at this time.  CSW spoke with Olivia Mackie at [facility] who states DON reviewed case and doesn't feel this patient is appropriate for the facility due to her dementia.  CSW attempted to reach Whitney at [facility] without success.  CSW spoke with Freda Munro at [facility] who states the facility can accept the patient.  CSW spoke with patient's daughter [name] to present her with bed offers. [name] was not agreeable to accept in of the available bed offers. [name] states she is in constant conversation with her sister who resides in [place]. [name] reports her father / patient's husband lives in an apartment on the independent living side of the facility. [name] states she is interested in potentially having the patient discharge back to the apartment with private duty nursing services for sitter and medication services with [facility] following. Patient's other daughter Eustaquio Maize joined the phone conversation. Both [name] and Eustaquio Maize are still not agreeing on which route to take for discharge planning. CSW provided [name] with options for [employer] agencies. CSW will return call to [name] in the morning to determine what decision has been made.  Madilyn Fireman, MSW, LCSW Transitions of Care  Clinical Social Worker II [phone]

## 2022-10-17 NOTE — Progress Notes (Signed)
Physical Therapy Treatment Patient Details Name: Emily Dawson MRN: [id number] DOB: 1934-07-02 Today's Date: 10/17/2022   History of Present Illness Pt is an 89 y/o female who presents s/p unwitnessed fall at memory care ([facility]) where she moved to about 3 days PTA. She sustained a L femoral neck fx and is now s/p L total hip replacement (anterior approach) on 10/13/2022. PMH significant for Alzheimer's dementia, Breast CA s/p bilateral mastectomy), CKD, glaucoma, HTN, osteoporosis, retinal vein occlusion L eye, thyroid disease.    PT Comments    Pt attempting to get OOB upon PT arrival, difficult to redirect without multimodal cuing. Pt progressed to hallway distance gait training this date, struggles with RW use from a cognitive perspective so benefits from +2 HHA. Pt overall requiring min-mod +2 physical assist at this time. PT to continue to follow while acute.      Recommendations for follow up therapy are one component of a multi-disciplinary discharge planning process, led by the attending physician.  Recommendations may be updated based on patient status, additional functional criteria and insurance authorization.  Follow Up Recommendations  Skilled nursing-short term rehab (<3 hours/day) Can patient physically be transported by private vehicle: Yes   Assistance Recommended at Discharge Frequent or constant Supervision/Assistance  Patient can return home with the following A little help with bathing/dressing/bathroom;Assistance with cooking/housework;Assist for transportation;A lot of help with walking and/or transfers   Equipment Recommendations  None recommended by PT    Recommendations for Other Services       Precautions / Restrictions Precautions Precautions: Fall Precaution Comments: advanced dementia Restrictions Weight Bearing Restrictions: No     Mobility  Bed Mobility Overal bed mobility: Needs Assistance Bed Mobility: Supine to Sit, Sit to Supine      Supine to sit: Mod assist Sit to supine: Mod assist   General bed mobility comments: assist for LE progression out of and into bed, tactile cuing    Transfers Overall transfer level: Needs assistance Equipment used: Rolling walker (2 wheels) Transfers: Sit to/from Stand Sit to Stand: Min assist, +2 physical assistance           General transfer comment: min +2 for power up, correcting posterior bias, and steadying    Ambulation/Gait Ambulation/Gait assistance: Min assist, +2 physical assistance Gait Distance (Feet): 80 Feet Assistive device: Rolling walker (2 wheels), 2 person hand held assist Gait Pattern/deviations: Step-through pattern, Decreased step length - left, Antalgic, Shuffle Gait velocity: decr     General Gait Details: assist to steady, correct posterior bias, right balance during directional changes. Initially using RW but pt with pt continued improper use of RW transitioned to +2 HHA   Stairs             Wheelchair Mobility    Modified Rankin (Stroke Patients Only)       Balance Overall balance assessment: Needs assistance Sitting-balance support: Feet supported Sitting balance-Leahy Scale: Fair     Standing balance support: Bilateral upper extremity supported, During functional activity Standing balance-Leahy Scale: Poor                              Cognition Arousal/Alertness: Awake/alert Behavior During Therapy: WFL for tasks assessed/performed Overall Cognitive Status: History of cognitive impairments - at baseline                                 General Comments: advanced  dementia, oriented to self only. Pt smiling and laughing at times during session, follows demonstrative and tactile cues best        Exercises General Exercises - Lower Extremity Long Arc Quad: AROM, Left, 5 reps, Seated, Limitations (limited by command following) Long Arc Quad Limitations: limited by command following    General  Comments        Pertinent Vitals/Pain Pain Assessment Pain Assessment: Faces Faces Pain Scale: Hurts little more Pain Location: LLE Pain Descriptors / Indicators: Grimacing, Guarding Pain Intervention(s): Limited activity within patient's tolerance, Monitored during session, Repositioned    Home Living                          Prior Function            PT Goals (current goals can now be found in the care plan section) Acute Rehab PT Goals Patient Stated Goal: None stated PT Goal Formulation: With family Time For Goal Achievement: 10/29/22 Potential to Achieve Goals: Good Progress towards PT goals: Progressing toward goals    Frequency    Min 2X/week      PT Plan Current plan remains appropriate    Co-evaluation              AM-PAC PT "6 Clicks" Mobility   Outcome Measure  Help needed turning from your back to your side while in a flat bed without using bedrails?: A Lot Help needed moving from lying on your back to sitting on the side of a flat bed without using bedrails?: A Lot Help needed moving to and from a bed to a chair (including a wheelchair)?: A Lot Help needed standing up from a chair using your arms (e.g., wheelchair or bedside chair)?: A Lot Help needed to walk in hospital room?: A Lot Help needed climbing 3-5 steps with a railing? : Total 6 Click Score: 11    End of Session Equipment Utilized During Treatment: Gait belt Activity Tolerance: Patient limited by fatigue Patient left: in bed;with bed alarm set;with call bell/phone within reach;Other (comment) (fall pads placed) Nurse Communication: Mobility status PT Visit Diagnosis: Unsteadiness on feet (R26.81);Pain;Difficulty in walking, not elsewhere classified (R26.2) Pain - Right/Left: Left Pain - part of body: Hip     Time: [id number] PT Time Calculation (min) (ACUTE ONLY): 18 min  Charges:  $Gait Training: 8-22 mins                    Stacie Glaze, PT DPT Acute Rehabilitation  Services Pager [phone]  Office [phone]    [place] 10/17/2022, 4:14 PM

## 2022-10-17 NOTE — Progress Notes (Signed)
Patient bladder scanned 416m notified provider for a order for straight cath or foley reinsertion.  Emily Dawson

## 2022-10-17 NOTE — Progress Notes (Signed)
Mobility Specialist - Progress Note   10/17/22 1008  Mobility  Activity Stood at bedside  Level of Assistance Maximum assist, patient does 25-49%  Assistive Device Front wheel walker  Activity Response Tolerated poorly  Mobility Referral Yes  $Mobility charge 1 Mobility   Pt was received in bed and asleep but able to be aroused. Pt was ModA to get to EOB and MaxA to stand at bedside. Session limited d/t weakness. Pt was returned to bed with all needs met and bed alarm on.   Franki Monte  Mobility Specialist Please contact via Solicitor or Rehab office at [phone]

## 2022-10-18 DIAGNOSIS — S72002A Fracture of unspecified part of neck of left femur, initial encounter for closed fracture: Secondary | ICD-10-CM | POA: Diagnosis not present

## 2022-10-18 MED ORDER — ENSURE ENLIVE PO LIQD
237.0000 mL | Freq: Two times a day (BID) | ORAL | Status: DC
  Administered 2022-10-19 – 2022-10-20 (×4): 237 mL via ORAL

## 2022-10-18 MED ORDER — ADULT MULTIVITAMIN W/MINERALS CH
1.0000 | ORAL_TABLET | Freq: Every day | ORAL | Status: DC
  Administered 2022-10-18 – 2022-10-21 (×4): 1 via ORAL
  Filled 2022-10-18 (×4): qty 1

## 2022-10-18 NOTE — Care Management Important Message (Signed)
Important Message  Patient Details  Name: Emily Dawson MRN: [id number] Date of Birth: 27-Feb-1934   Medicare Important Message Given:  Yes     Brycen Bean 10/18/2022, 9:22 AM

## 2022-10-18 NOTE — Progress Notes (Signed)
Mobility Specialist - Progress Note   10/18/22 0946  Mobility  Activity Ambulated with assistance in room  Level of Assistance Moderate assist, patient does 50-74%  Assistive Device Front wheel walker  Distance Ambulated (ft) 4 ft  Activity Response Tolerated well  Mobility Referral Yes  $Mobility charge 1 Mobility   Pt was received in chair and agreeable. Pt was ModA throughout session and required multiple vocal cues during session. Pt was returned to chair with all needs met and chair alarm on.   Franki Monte  Mobility Specialist Please contact via Solicitor or Rehab office at [phone]

## 2022-10-18 NOTE — Progress Notes (Signed)
Initial Nutrition Assessment  DOCUMENTATION CODES:   Underweight  INTERVENTION:  - Add Ensure Enlive po BID, each supplement provides 350 kcal and 20 grams of protein.   - Add MVI q day.   NUTRITION DIAGNOSIS:   Inadequate oral intake related to lethargy/confusion as evidenced by meal completion < 50%.  GOAL:   Patient will meet greater than or equal to 90% of their needs  MONITOR:   PO intake, Supplement acceptance  REASON FOR ASSESSMENT:   Malnutrition Screening Tool    ASSESSMENT:   89 y.o. female admits related to fall. PMH includes: alzheimer's disease, HTN, CKD, breast cancer s/p bilateral mastectomy, arthritis and glaucoma. Pt is currently receiving medical management related to left displaced femoral neck fracture.  Meds reviewed: colace, miralax, senokot. Labs reviewed: K low.   The pt is currently oriented x1. Not many intakes noted per record, except one meal at 0%. No significant wt loss per record. Pallaitive care following. Per social work note, pt is planning to discharge with hospice. RD will add Ensure BID for now and continue to monitor POC.   NUTRITION - FOCUSED PHYSICAL EXAM:  Unable to assess, attempt at follow up.   Diet Order:   Diet Order             Diet - low sodium heart healthy           Diet regular Room service appropriate? No; Fluid consistency: Thin  Diet effective now                   EDUCATION NEEDS:   Not appropriate for education at this time  Skin:  Skin Assessment: Skin Integrity Issues: Skin Integrity Issues:: Incisions Incisions: left hip  Last BM:  10/18/22  Height:   Ht Readings from Last 1 Encounters:  10/13/22 5' (1.524 m)    Weight:   Wt Readings from Last 1 Encounters:  10/13/22 40 kg    Ideal Body Weight:     BMI:  Body mass index is 17.22 kg/m.  Estimated Nutritional Needs:   Kcal:  1200-1400 kcals  Protein:  60-70 gm  Fluid:  >/= 1.2L  Noriko Macari Graciela Husbands, RD, LDN, CNSC.

## 2022-10-18 NOTE — Progress Notes (Addendum)
1pm: CSW spoke with Delsa Sale of [facility] who states she has spoken with the home health agency representative that covers the facility and they are agreeable to accept this patient for services upon her return to the facility.  12pm: CSW spoke with patient's daughter [name] who states the family is working diligently to obtain a Actuary but one has not been secured yet.  8:30am: CSW spoke with Delsa Sale of [facility] who states patient will be returning to [facility] with DME and a sitter. Patient's daughters are currently working to obtain a Actuary from a [employer].   CSW spoke with MD to request DME be ordered - hospital bed, walker, and bedside commode.  Madilyn Fireman, MSW, LCSW Transitions of Care  Clinical Social Worker II [phone]

## 2022-10-18 NOTE — Progress Notes (Signed)
Physical Therapy Treatment Patient Details Name: Emily Dawson MRN: [id number] DOB: 21-Dec-1933 Today's Date: 10/18/2022   History of Present Illness Pt is an 89 y/o female who presents s/p unwitnessed fall at memory care ([facility]) where she moved to about 3 days PTA. She sustained a L femoral neck fx and is now s/p L total hip replacement (anterior approach) on 10/13/2022. PMH significant for Alzheimer's dementia, Breast CA s/p bilateral mastectomy), CKD, glaucoma, HTN, osteoporosis, retinal vein occlusion L eye, thyroid disease.    PT Comments    Tolerated treatment well. Ambulating with RW for support. Frequent, near constant min assist to facilitate RW control but without overt buckling or LOB while using RW for support. Min assist for transitions with Sit<>Stand with max VC for technique and to facilitate. Some difficulty completing LE exercises due to cognition. Received secure chat requesting follow-up for reassessment as family intends to send pt to ALF with private duty care. Pt should have 24/7 supervision due to impaired cognition and high fall risk. Repositioned in recliner for comfort. Patient will continue to benefit from skilled physical therapy services to further improve independence with functional mobility.    Recommendations for follow up therapy are one component of a multi-disciplinary discharge planning process, led by the attending physician.  Recommendations may be updated based on patient status, additional functional criteria and insurance authorization.  Follow Up Recommendations  Home health PT (at ALF per family request - message received from staff; Would arrange 24/7 care due to high fall risk and labile cognition.) If unable to provide SNF would be safe alternative. Can patient physically be transported by private vehicle: Yes   Assistance Recommended at Discharge Frequent or constant Supervision/Assistance  Patient can return home with the following A little  help with bathing/dressing/bathroom;Assistance with cooking/housework;Assist for transportation;A lot of help with walking and/or transfers   Equipment Recommendations  None recommended by PT    Recommendations for Other Services       Precautions / Restrictions Precautions Precautions: Fall Precaution Comments: advanced dementia Restrictions Weight Bearing Restrictions: No     Mobility  Bed Mobility               General bed mobility comments: In recliner    Transfers Overall transfer level: Needs assistance Equipment used: Rolling walker (2 wheels) Transfers: Sit to/from Stand Sit to Stand: Min assist           General transfer comment: Min assist for boost to stand, and to control/facilitate transitions. Hand over hand assist for set-up and positioning.    Ambulation/Gait Ambulation/Gait assistance: Min assist Gait Distance (Feet): 85 Feet Assistive device: Rolling walker (2 wheels) Gait Pattern/deviations: Step-through pattern, Decreased step length - left, Antalgic, Shuffle Gait velocity: decr Gait velocity interpretation: <1.31 ft/sec, indicative of household ambulator   General Gait Details: Frequent/constant min assist to facilitate continuous steps. Mildly antalgic gait pattern.Cues for direction throughout. No overt buckling or LOB. Drifts into furniture unless corrected. Cues for awareness.   Stairs             Wheelchair Mobility    Modified Rankin (Stroke Patients Only)       Balance Overall balance assessment: Needs assistance Sitting-balance support: Feet supported Sitting balance-Leahy Scale: Fair     Standing balance support: During functional activity, Single extremity supported Standing balance-Leahy Scale: Poor  Cognition Arousal/Alertness: Awake/alert Behavior During Therapy: WFL for tasks assessed/performed Overall Cognitive Status: History of cognitive impairments - at  baseline                                 General Comments: advanced dementia, oriented to self only.        Exercises General Exercises - Lower Extremity Ankle Circles/Pumps: AAROM, Both, 10 reps, Seated Long Arc Quad: Left, Seated, AAROM, Both, 10 reps    General Comments        Pertinent Vitals/Pain Pain Assessment Pain Assessment: PAINAD Breathing: normal Negative Vocalization: none Facial Expression: smiling or inexpressive Body Language: tense, distressed pacing, fidgeting Consolability: no need to console PAINAD Score: 1 Pain Location: LLE Pain Descriptors / Indicators: Operative site guarding Pain Intervention(s): Monitored during session, Limited activity within patient's tolerance, Repositioned    Home Living                          Prior Function            PT Goals (current goals can now be found in the care plan section) Acute Rehab PT Goals Patient Stated Goal: None stated PT Goal Formulation: With family Time For Goal Achievement: 10/29/22 Potential to Achieve Goals: Good Progress towards PT goals: Progressing toward goals    Frequency    Min 2X/week      PT Plan Discharge plan needs to be updated    Co-evaluation              AM-PAC PT "6 Clicks" Mobility   Outcome Measure  Help needed turning from your back to your side while in a flat bed without using bedrails?: A Lot Help needed moving from lying on your back to sitting on the side of a flat bed without using bedrails?: A Lot Help needed moving to and from a bed to a chair (including a wheelchair)?: A Little Help needed standing up from a chair using your arms (e.g., wheelchair or bedside chair)?: A Little Help needed to walk in hospital room?: A Little Help needed climbing 3-5 steps with a railing? : Total 6 Click Score: 14    End of Session Equipment Utilized During Treatment: Gait belt Activity Tolerance: Patient tolerated treatment well Patient  left: with call bell/phone within reach;in chair;with chair alarm set;with nursing/sitter in room ([facility] alarm) Nurse Communication: Mobility status PT Visit Diagnosis: Unsteadiness on feet (R26.81);Pain;Difficulty in walking, not elsewhere classified (R26.2) Pain - Right/Left: Left Pain - part of body: Hip     Time: [id number] PT Time Calculation (min) (ACUTE ONLY): 13 min  Charges:  $Gait Training: 8-22 mins                     Candie Mile, PT, DPT Physical Therapist [facility] [place]    Ellouise Newer 10/18/2022, 11:17 AM

## 2022-10-18 NOTE — Discharge Summary (Addendum)
Physician Discharge Summary  Emily Dawson [id number] DOB: Mar 24, 1934 DOA: 10/13/2022  PCP: Edmonia Caprio, NP  Admit date: 10/13/2022 Discharge date: 10/18/2022  Admitted From: ALF Disposition: ALF     Recommendations for Outpatient Follow-up:  Follow up with SNF provider at earliest convenience  outpatient follow-up with hospice at earliest convenience Outpatient follow-up with palliative care Outpatient follow-up with orthopedics.  Discharge pain management/DVT prophylaxis/wound care as per orthopedics recommendations Follow up in ED if symptoms worsen or new appear   Home Health: No Equipment/Devices: None.  Foley catheter has already been removed; currently on room air Discharge Condition: Poor  CODE STATUS: DNR Diet recommendation: Heart healthy  Brief/Interim Summary: 89 year old Alzheimer's dementia, HTN, CKD, breast cancer status post bilateral mastectomy, arthritis, glaucoma admitted after unwitnessed fall. In the ED CT of the head and cervical spine did not show acute pathology, chest x-ray showed mild increased coarse bilateral infiltrate. X-ray of the hip showed nondisplaced fracture of the left femoral neck, orthopedic was consulted. Patient underwent left total hip replacement by Dr. Alvan Dame on 2/8.  Because of very poor prognosis, family initially agreed that patient will return to ALF with hospice following.  She was supposed to be discharged on 10/15/2022.  Subsequently, family decided that patient should go to SNF for PT.  Discharge was held.  But it looks like the family wants her discharged back to ALF.  Overall prognosis is very poor.  Discharge Diagnoses:   Left femoral neck fracture secondary to fall Status post total hip replacement on 2/8 -Unwitnessed fall.  CT head and cervical spine negative.  Suffered from left femoral neck fracture and orthopedic performed total hip replacement on 2/8.  Discharge pain management, postop DVT prophylaxis prophylaxis,  surgical site management/wound care as per orthopedics recommendations.  Outpatient follow-up with orthopedics.  -Continue bowel regimen -Because of very poor prognosis, family has initially agreed that patient will return to ALF with hospice following. She was supposed to be discharged on 10/15/2022.  Subsequently, family decided that patient should go to SNF for PT.  Discharge was held.  But it looks like the family wants her discharged back to ALF.  Discharge patient to ALF today.  Overall prognosis is very poor.   Leukocytosis Abnormal chest x-ray I suspect reactive in nature.  UA is negative, chest x-ray did not show infiltrate.  Flu/RSV/COVID-negative.  Procalcitonin negative.  No recent labs.  Being monitored off antibiotics.  Dehydration -Treated with IV fluids.  Encourage oral intake.  Urinary retention, acute -800 cc of urinary retention in the ED. required Foley catheter initially but subsequently Foley catheter was discontinued.    Chronic kidney disease stage II Stable.  Creatinine stable.  No recent labs.  Hypokalemia -No recent labs.  Normocytic anemia -Possibly from chronic illnesses and hip fracture.  Hemoglobin currently stable  Thrombocytopenia -Mild.  No recent labs.  Essential hypertension - Blood pressure on the higher side.  Continue Toprol-XL and amlodipine.  Alzheimer's dementia, advanced Goals of care Delirium -Supportive care, continue Namenda -Because of very poor prognosis, family has initially agreed that patient will return to ALF with hospice following. Subsequently, family decided that patient should go to SNF for PT.  Discharge was held. But it looks like the family wants her discharged back to ALF.  Discharge patient to ALF today.   Overall prognosis is very poor. -Palliative care consult for goals of care discussion. -Patient has had issues with intermittent agitation during the hospitalization.  Discharge Instructions  Discharge Instructions  Diet - low sodium heart healthy   Complete by: As directed    Increase activity slowly   Complete by: As directed       Allergies as of 10/19/2022       Reactions   Nsaids    Ampicillin    Arimidex [anastrozole] Other (See Comments)   Kidney problems    Azithromycin    Celecoxib    Fosamax [alendronate] Other (See Comments)   Kidney problem    Ibuprofen    Naproxen    Penicillins    Sulfa Antibiotics    Tetracyclines & Related    Losartan Rash        Medication List     TAKE these medications    acetaminophen 500 MG tablet Commonly known as: TYLENOL Take 1 tablet (500 mg total) by mouth every 8 (eight) hours as needed for moderate pain. What changed: when to take this   ALPRAZolam 0.25 MG tablet Commonly known as: XANAX Take 1 tablet (0.25 mg total) by mouth 3 (three) times daily as needed for anxiety.   amLODipine 5 MG tablet Commonly known as: NORVASC Take 1 tablet (5 mg total) by mouth daily.   aspirin 81 MG chewable tablet Chew 1 tablet (81 mg total) by mouth 2 (two) times daily for 28 days.   b complex vitamins capsule Take 1 capsule by mouth daily.   docusate sodium 100 MG capsule Commonly known as: COLACE Take 1 capsule (100 mg total) by mouth 2 (two) times daily.   dorzolamidel-timolol 22.3-6.8 MG/ML Soln ophthalmic solution Commonly known as: COSOPT Place 1 drop into both eyes 2 (two) times daily.   HYDROcodone-acetaminophen 5-325 MG tablet Commonly known as: NORCO/VICODIN Take 1 tablet by mouth every 8 (eight) hours as needed for severe pain.   latanoprost 0.005 % ophthalmic solution Commonly known as: XALATAN Place 1 drop into both eyes at bedtime.   melatonin 3 MG Tabs tablet Take 3 mg by mouth at bedtime.   memantine 5 MG tablet Commonly known as: NAMENDA Take 5 mg by mouth at bedtime.   methocarbamol 500 MG tablet Commonly known as: ROBAXIN Take 1 tablet (500 mg total) by mouth every 6 (six) hours as needed for muscle  spasms.   metoprolol succinate 100 MG 24 hr tablet Commonly known as: TOPROL-XL Take 1 tablet (100 mg total) by mouth daily. TAKE WITH OR IMMEDIATELY FOLLOWING A MEAL.   mineral oil-hydrophilic petrolatum ointment Apply topically as needed for dry skin. Both legs What changed:  how much to take additional instructions   polyethylene glycol 17 g packet Commonly known as: MIRALAX / GLYCOLAX Take 17 g by mouth daily as needed for moderate constipation.   QUEtiapine 25 MG tablet Commonly known as: SEROQUEL Take 12.5 mg by mouth at bedtime.   Refresh Optive Advanced PF 0.5-1-0.5 % Soln Generic drug: Carboxymeth-Glyc-Polysorb PF Place 1 drop into both eyes at bedtime.   VITAMIN D (CHOLECALCIFEROL) PO Take 2,000 Units by mouth daily.               Durable Medical Equipment  (From admission, onward)           Start     Ordered   10/18/22 0826  For home use only DME 4 wheeled rolling walker with seat  Once       Question:  Patient needs a walker to treat with the following condition  Answer:  Weakness   10/18/22 0825   10/18/22 0824  For home use  only DME Bedside commode  Once       Question:  Patient needs a bedside commode to treat with the following condition  Answer:  Weakness   10/18/22 0825   10/18/22 0823  For home use only DME Hospital bed  Once       Question Answer Comment  Length of Need 6 Months   Bed type Semi-electric      10/18/22 0825   10/15/22 1006  For home use only DME oxygen  Once       Question Answer Comment  Length of Need Lifetime   Mode or (Route) Nasal cannula   Liters per Minute 1   Frequency Continuous (stationary and portable oxygen unit needed)   Oxygen conserving device Yes   Oxygen delivery system Gas      10/15/22 1005                Follow-up Information     Paralee Cancel, MD. Schedule an appointment as soon as possible for a visit in 1 week(s).   Specialty: Orthopedic Surgery Contact information: [address] [place] [address] [place] [address] [id number]                Allergies  Allergen Reactions   Nsaids    Ampicillin    Arimidex [Anastrozole] Other (See Comments)    Kidney problems    Azithromycin    Celecoxib    Fosamax [Alendronate] Other (See Comments)    Kidney problem    Ibuprofen    Naproxen    Penicillins    Sulfa Antibiotics    Tetracyclines & Related    Losartan Rash    Consultations: Orthopedics   Procedures/Studies: DG Pelvis Portable  Result Date: 10/13/2022 CLINICAL DATA:  Status post left total hip arthroplasty. EXAM: PORTABLE PELVIS 1-2 VIEWS COMPARISON:  Radiographs of same day. FINDINGS: The left femoral and acetabular components are well situated. Expected postoperative changes seen in the surrounding soft tissues. IMPRESSION: Status post left total hip arthroplasty. Electronically Signed   By: Marijo Conception M.D.   On: 10/13/2022 15:24   DG C-Arm 1-60 Min-No Report  Result Date: 10/13/2022 Fluoroscopy was utilized by the requesting physician.  No radiographic interpretation.   DG Chest Port 1 View  Result Date: 10/13/2022 CLINICAL DATA:  89 year old female status post fall with left femoral neck fracture. EXAM: PORTABLE CHEST 1 VIEW COMPARISON:  Left hip series 0305 hours today. Cervical spine CT today. FINDINGS: Portable AP supine view at 0402 hours. Lung volumes and cardiac size are at the upper limits of normal. Other mediastinal contours are within normal limits. Visualized tracheal air column is within normal limits. Right greater than left axillary surgical clips. No pneumothorax, pleural effusion or confluent lung opacity. Diffuse mildly increased and coarse bilateral pulmonary interstitial opacity is nonspecific. Lung apices on cervical spine CT today demonstrated only mild scarring. Oval osseous structures appear intact. IMPRESSION: Nonspecific mildly increased, coarse bilateral pulmonary interstitial opacity with no previous exams. These  could be chronic interstitial lung changes. In the appropriate setting of viral/atypical respiratory infection would be difficult to exclude. But otherwise no acute cardiopulmonary abnormality. Electronically Signed   By: Genevie Ann M.D.   On: 10/13/2022 04:16   CT Cervical Spine Wo Contrast  Result Date: 10/13/2022 CLINICAL DATA:  Neck trauma (Age >= 65y).  Unwitnessed fall EXAM: CT CERVICAL SPINE WITHOUT CONTRAST TECHNIQUE: Multidetector CT imaging of the cervical spine was performed without intravenous contrast. Multiplanar CT image reconstructions  were also generated. RADIATION DOSE REDUCTION: This exam was performed according to the departmental dose-optimization program which includes automated exposure control, adjustment of the mA and/or kV according to patient size and/or use of iterative reconstruction technique. COMPARISON:  None Available. FINDINGS: Alignment: Normal Skull base and vertebrae: No acute fracture. No primary bone lesion or focal pathologic process. Soft tissues and spinal canal: No prevertebral fluid or swelling. No visible canal hematoma. Disc levels: Degenerative disc disease at C5-6 and C6-7 with disc space narrowing and spurring. Moderate bilateral degenerative facet disease, right greater than left. Fusion across the right C2-3 facet. Upper chest: Biapical scarring.  No acute findings. Other: None IMPRESSION: Degenerative disc and facet disease.  No acute bony abnormality. Electronically Signed   By: Rolm Baptise M.D.   On: 10/13/2022 03:31   CT Head Wo Contrast  Result Date: 10/13/2022 CLINICAL DATA:  Head trauma, minor (Age >= 65y).  Fall. EXAM: CT HEAD WITHOUT CONTRAST TECHNIQUE: Contiguous axial images were obtained from the base of the skull through the vertex without intravenous contrast. RADIATION DOSE REDUCTION: This exam was performed according to the departmental dose-optimization program which includes automated exposure control, adjustment of the mA and/or kV according  to patient size and/or use of iterative reconstruction technique. COMPARISON:  None Available. FINDINGS: Brain: There is atrophy and chronic small vessel disease changes. No acute intracranial abnormality. Specifically, no hemorrhage, hydrocephalus, mass lesion, acute infarction, or significant intracranial injury. Vascular: No hyperdense vessel or unexpected calcification. Skull: No acute calvarial abnormality. Sinuses/Orbits: No acute findings Other: None IMPRESSION: Atrophy, chronic microvascular disease. No acute intracranial abnormality. Electronically Signed   By: Rolm Baptise M.D.   On: 10/13/2022 03:29   DG Hip Unilat W or Wo Pelvis 2-3 Views Left  Result Date: 10/13/2022 CLINICAL DATA:  Unwitnessed fall EXAM: DG HIP (WITH OR WITHOUT PELVIS) 2-3V LEFT COMPARISON:  None Available. FINDINGS: Degenerative changes in the left hip with joint space narrowing and sclerosis and osteophyte formation. Mild deformity of the left hip with cortical irregularity along the femoral neck likely represents an impacted acute fracture. No inter trochanteric involvement is suggested. Pelvis and sacrum appear intact. Surgical clips in the pelvis. IMPRESSION: Nondisplaced impacted fracture of the left femoral neck. Degenerative changes in the left hip. Electronically Signed   By: Lucienne Capers M.D.   On: 10/13/2022 03:24      Subjective: Patient seen and examined at bedside.  Awake, still confused.  No overnight agitation, seizures, vomiting or fever reported. Discharge Exam: Vitals:   10/18/22 0231 10/18/22 0725  BP: (!) 152/75 (!) 157/86  Pulse: 95 86  Resp: 17 18  Temp: 98.8 F (37.1 C) 98.1 F (36.7 C)  SpO2: 95% 100%    General: Looks chronically ill and deconditioned.  No acute distress.  Elderly female lying in bed.  Awake, still confused. Cardiovascular: S1-S2 heard; currently rate controlled  respiratory: Decreased breath sounds at bases bilaterally with some crackles abdominal: Soft, NT,  slightly distended, bowel sounds + Extremities: No clubbing; mild lower extremity edema present   The results of significant diagnostics from this hospitalization (including imaging, microbiology, ancillary and laboratory) are listed below for reference.     Microbiology: Recent Results (from the past 240 hour(s))  Surgical pcr screen     Status: None   Collection Time: 10/13/22  6:17 AM   Specimen: Nasal Mucosa; Nasal Swab  Result Value Ref Range Status   MRSA, PCR NEGATIVE NEGATIVE Final   Staphylococcus aureus NEGATIVE NEGATIVE Final  Comment: (NOTE) The Xpert SA Assay (FDA approved for NASAL specimens in patients 60 years of age and older), is one component of a comprehensive surveillance program. It is not intended to diagnose infection nor to guide or monitor treatment. Performed at [facility], [place] [address] [place], [place] [address]   Resp panel by RT-PCR (RSV, Flu A&B, Covid) Anterior Nasal Swab     Status: None   Collection Time: 10/13/22  7:24 AM   Specimen: Anterior Nasal Swab  Result Value Ref Range Status   SARS Coronavirus 2 by RT PCR NEGATIVE NEGATIVE Final   Influenza A by PCR NEGATIVE NEGATIVE Final   Influenza B by PCR NEGATIVE NEGATIVE Final    Comment: (NOTE) The Xpert Xpress SARS-CoV-2/FLU/RSV plus assay is intended as an aid in the diagnosis of influenza from Nasopharyngeal swab specimens and should not be used as a sole basis for treatment. Nasal washings and aspirates are unacceptable for Xpert Xpress SARS-CoV-2/FLU/RSV testing.  Fact Sheet for Patients: EntrepreneurPulse.com.au  Fact Sheet for Healthcare Providers: IncredibleEmployment.be  This test is not yet approved or cleared by the [place] FDA and has been authorized for detection and/or diagnosis of SARS-CoV-2 by FDA under an Emergency Use Authorization (EUA). This EUA will remain in effect (meaning this test can be used) for the  duration of the COVID-19 declaration under Section 564(b)(1) of the Act, 21 U.S.C. section 360bbb-3(b)(1), unless the authorization is terminated or revoked.     Resp Syncytial Virus by PCR NEGATIVE NEGATIVE Final    Comment: (NOTE) Fact Sheet for Patients: EntrepreneurPulse.com.au  Fact Sheet for Healthcare Providers: IncredibleEmployment.be  This test is not yet approved or cleared by the [place] FDA and has been authorized for detection and/or diagnosis of SARS-CoV-2 by FDA under an Emergency Use Authorization (EUA). This EUA will remain in effect (meaning this test can be used) for the duration of the COVID-19 declaration under Section 564(b)(1) of the Act, 21 U.S.C. section 360bbb-3(b)(1), unless the authorization is terminated or revoked.  Performed at [facility], [place] [address], [place], [place] [address]      Labs: BNP (last 3 results) No results for input(s): "BNP" in the last 8760 hours. Basic Metabolic Panel: Recent Labs  Lab 10/13/22 0247 10/14/22 0412 10/15/22 0237  NA 140 141 144  K 3.7 3.5 3.1*  CL 107 103 111  CO2 24 21* 24  GLUCOSE 138* 150* 109*  BUN 37* 28* 27*  CREATININE 1.00 1.09* 0.82  CALCIUM 9.3 8.7* 8.2*  MG  --   --  2.1    Liver Function Tests: No results for input(s): "AST", "ALT", "ALKPHOS", "BILITOT", "PROT", "ALBUMIN" in the last 168 hours. No results for input(s): "LIPASE", "AMYLASE" in the last 168 hours. No results for input(s): "AMMONIA" in the last 168 hours. CBC: Recent Labs  Lab 10/13/22 0247 10/14/22 0412 10/15/22 0237  WBC 18.0* 16.2* 13.5*  NEUTROABS 14.6*  --   --   HGB 13.5 12.0 10.7*  HCT 38.8 36.1 29.7*  MCV 95.3 95.5 93.1  PLT 196 181 143*    Cardiac Enzymes: No results for input(s): "CKTOTAL", "CKMB", "CKMBINDEX", "TROPONINI" in the last 168 hours. BNP: Invalid input(s): "POCBNP" CBG: No results for input(s): "GLUCAP" in the last 168  hours. D-Dimer No results for input(s): "DDIMER" in the last 72 hours. Hgb A1c No results for input(s): "HGBA1C" in the last 72 hours. Lipid Profile No results for input(s): "CHOL", "HDL", "LDLCALC", "TRIG", "CHOLHDL", "LDLDIRECT" in  the last 72 hours. Thyroid function studies No results for input(s): "TSH", "T4TOTAL", "T3FREE", "THYROIDAB" in the last 72 hours.  Invalid input(s): "FREET3" Anemia work up No results for input(s): "VITAMINB12", "FOLATE", "FERRITIN", "TIBC", "IRON", "RETICCTPCT" in the last 72 hours. Urinalysis    Component Value Date/Time   COLORURINE STRAW (A) 10/13/2022 0815   APPEARANCEUR CLEAR 10/13/2022 0815   LABSPEC 1.012 10/13/2022 0815   PHURINE 7.0 10/13/2022 0815   GLUCOSEU 50 (A) 10/13/2022 0815   HGBUR NEGATIVE 10/13/2022 0815   BILIRUBINUR NEGATIVE 10/13/2022 0815   BILIRUBINUR Negative 03/19/2021 1449   KETONESUR 20 (A) 10/13/2022 0815   PROTEINUR 30 (A) 10/13/2022 0815   UROBILINOGEN negative (A) 03/19/2021 1449   NITRITE NEGATIVE 10/13/2022 0815   LEUKOCYTESUR NEGATIVE 10/13/2022 0815   Sepsis Labs Recent Labs  Lab 10/13/22 0247 10/14/22 0412 10/15/22 0237  WBC 18.0* 16.2* 13.5*    Microbiology Recent Results (from the past 240 hour(s))  Surgical pcr screen     Status: None   Collection Time: 10/13/22  6:17 AM   Specimen: Nasal Mucosa; Nasal Swab  Result Value Ref Range Status   MRSA, PCR NEGATIVE NEGATIVE Final   Staphylococcus aureus NEGATIVE NEGATIVE Final    Comment: (NOTE) The Xpert SA Assay (FDA approved for NASAL specimens in patients 20 years of age and older), is one component of a comprehensive surveillance program. It is not intended to diagnose infection nor to guide or monitor treatment. Performed at [facility], [place] [address] [place], [place] [address]   Resp panel by RT-PCR (RSV, Flu A&B, Covid) Anterior Nasal Swab     Status: None   Collection Time: 10/13/22  7:24 AM   Specimen: Anterior Nasal Swab   Result Value Ref Range Status   SARS Coronavirus 2 by RT PCR NEGATIVE NEGATIVE Final   Influenza A by PCR NEGATIVE NEGATIVE Final   Influenza B by PCR NEGATIVE NEGATIVE Final    Comment: (NOTE) The Xpert Xpress SARS-CoV-2/FLU/RSV plus assay is intended as an aid in the diagnosis of influenza from Nasopharyngeal swab specimens and should not be used as a sole basis for treatment. Nasal washings and aspirates are unacceptable for Xpert Xpress SARS-CoV-2/FLU/RSV testing.  Fact Sheet for Patients: EntrepreneurPulse.com.au  Fact Sheet for Healthcare Providers: IncredibleEmployment.be  This test is not yet approved or cleared by the [place] FDA and has been authorized for detection and/or diagnosis of SARS-CoV-2 by FDA under an Emergency Use Authorization (EUA). This EUA will remain in effect (meaning this test can be used) for the duration of the COVID-19 declaration under Section 564(b)(1) of the Act, 21 U.S.C. section 360bbb-3(b)(1), unless the authorization is terminated or revoked.     Resp Syncytial Virus by PCR NEGATIVE NEGATIVE Final    Comment: (NOTE) Fact Sheet for Patients: EntrepreneurPulse.com.au  Fact Sheet for Healthcare Providers: IncredibleEmployment.be  This test is not yet approved or cleared by the [place] FDA and has been authorized for detection and/or diagnosis of SARS-CoV-2 by FDA under an Emergency Use Authorization (EUA). This EUA will remain in effect (meaning this test can be used) for the duration of the COVID-19 declaration under Section 564(b)(1) of the Act, 21 U.S.C. section 360bbb-3(b)(1), unless the authorization is terminated or revoked.  Performed at [facility], [place] [address] [place], [place] [address]      Time coordinating discharge: 35 minutes  SIGNED:   Aline August, MD  [facility] 10/18/2022, 7:51 AM

## 2022-10-18 NOTE — Progress Notes (Signed)
     Consult received. Chart reviewed. Per TOC note today, patient is returning to [facility] with [facility] hospice.   There is a MOST form, DNR, and HCPOA document on file in [facility].  PMT will sign off, please re-consult if needed.     Elie Confer, NP-C Palliative Medicine   Please call Palliative Medicine team phone with any questions [phone]. For individual providers please see AMION.   No charge

## 2022-10-18 NOTE — Progress Notes (Signed)
Occupational Therapy Treatment Patient Details Name: Emily Dawson MRN: [id number] DOB: 1934-07-16 Today's Date: 10/18/2022   History of present illness Pt is an 89 y/o female who presents s/p unwitnessed fall at memory care ([facility]) where she moved to about 3 days PTA. She sustained a L femoral neck fx and is now s/p L total hip replacement (anterior approach) on 10/13/2022. PMH significant for Alzheimer's dementia, Breast CA s/p bilateral mastectomy), CKD, glaucoma, HTN, osteoporosis, retinal vein occlusion L eye, thyroid disease.   OT comments  Patient received in supine and required frequent cues and mod assist to get to EOB. RW attempted for transfer to [facility] with patient having difficulty with use and was mod assist with HHA. Patient stood for hygiene and clothing management with use of RW after toileting.  Patient able to participate with with grooming and LB dressing with mod verbal cues and mod assist for grooming and mod/max assist with LB dressing. Discharge recommendations continue to be appropriate, acute OT to continue to follow.    Recommendations for follow up therapy are one component of a multi-disciplinary discharge planning process, led by the attending physician.  Recommendations may be updated based on patient status, additional functional criteria and insurance authorization.    Follow Up Recommendations  Skilled nursing-short term rehab (<3 hours/day)     Assistance Recommended at Discharge Frequent or constant Supervision/Assistance  Patient can return home with the following  A lot of help with walking and/or transfers;A lot of help with bathing/dressing/bathroom;Assistance with cooking/housework;Assistance with feeding;Direct supervision/assist for medications management;Direct supervision/assist for financial management;Assist for transportation;Help with stairs or ramp for entrance   Equipment Recommendations  Other (comment) (defer)    Recommendations for Other  Services      Precautions / Restrictions Precautions Precautions: Fall Precaution Comments: advanced dementia Restrictions Weight Bearing Restrictions: No       Mobility Bed Mobility Overal bed mobility: Needs Assistance Bed Mobility: Supine to Sit     Supine to sit: Mod assist     General bed mobility comments: required assistance with BLEs    Transfers Overall transfer level: Needs assistance Equipment used: 1 person hand held assist Transfers: Sit to/from Stand, Bed to chair/wheelchair/BSC Sit to Stand: Mod assist Stand pivot transfers: Mod assist         General transfer comment: posterior leaning during transfer, would not use RW     Balance Overall balance assessment: Needs assistance Sitting-balance support: Feet supported Sitting balance-Leahy Scale: Fair     Standing balance support: Bilateral upper extremity supported, During functional activity Standing balance-Leahy Scale: Poor Standing balance comment: reliant on UE support when standing                           ADL either performed or assessed with clinical judgement   ADL Overall ADL's : Needs assistance/impaired     Grooming: Wash/dry hands;Wash/dry face;Brushing hair;Moderate assistance;Sitting Grooming Details (indicate cue type and reason): able to wash face and hands with verbal cues and required mod assist to comb hair             Lower Body Dressing: Moderate assistance;Maximal assistance;Sit to/from stand Lower Body Dressing Details (indicate cue type and reason): able to doff and donn sock on LLE and required assistance with right Toilet Transfer: Moderate assistance;Stand-pivot;BSC/3in1 Toilet Transfer Details (indicate cue type and reason): required assistance to power up and pivot Toileting- Clothing Manipulation and Hygiene: Maximal assistance;Sit to/from stand Toileting - Water quality scientist Details (  indicate cue type and reason): required max assist for  clothing management and hygiene while standing       General ADL Comments: required frequent cues for all tasks    Extremity/Trunk Assessment              Vision       Perception     Praxis      Cognition Arousal/Alertness: Awake/alert Behavior During Therapy: WFL for tasks assessed/performed Overall Cognitive Status: History of cognitive impairments - at baseline                                 General Comments: advanced dementia, followed simple commands with increased time        Exercises      Shoulder Instructions       General Comments      Pertinent Vitals/ Pain       Pain Assessment Pain Assessment: Faces Faces Pain Scale: Hurts a little bit Pain Location: LLE Pain Descriptors / Indicators: Grimacing Pain Intervention(s): Monitored during session, Repositioned  Home Living                                          Prior Functioning/Environment              Frequency  Min 2X/week        Progress Toward Goals  OT Goals(current goals can now be found in the care plan section)  Progress towards OT goals: Progressing toward goals  Acute Rehab OT Goals OT Goal Formulation: Patient unable to participate in goal setting Time For Goal Achievement: 10/29/22 Potential to Achieve Goals: Good ADL Goals Pt Will Perform Grooming: sitting;with min guard assist Pt Will Perform Upper Body Dressing: with set-up;sitting Pt Will Perform Lower Body Dressing: with min assist;sit to/from stand Pt Will Transfer to Toilet: with min assist;ambulating  Plan Discharge plan remains appropriate    Co-evaluation                 AM-PAC OT "6 Clicks" Daily Activity     Outcome Measure   Help from another person eating meals?: A Little Help from another person taking care of personal grooming?: A Lot Help from another person toileting, which includes using toliet, bedpan, or urinal?: A Lot Help from another person  bathing (including washing, rinsing, drying)?: A Lot Help from another person to put on and taking off regular upper body clothing?: A Lot Help from another person to put on and taking off regular lower body clothing?: A Lot 6 Click Score: 13    End of Session Equipment Utilized During Treatment: Gait belt  OT Visit Diagnosis: Unsteadiness on feet (R26.81);Other abnormalities of gait and mobility (R26.89);Muscle weakness (generalized) (M62.81);Pain;Repeated falls (R29.6);History of falling (Z91.81) Pain - Right/Left: Left Pain - part of body: Leg   Activity Tolerance Patient tolerated treatment well   Patient Left in chair;with call bell/phone within reach;with chair alarm set;with restraints reapplied (Posey belt)   Nurse Communication Mobility status        Time: [id number] OT Time Calculation (min): 22 min  Charges: OT General Charges $OT Visit: 1 Visit OT Treatments $Self Care/Home Management : 8-22 mins  Lodema Hong, [place]  Office [place] 10/18/2022, 11:21 AM

## 2022-10-19 ENCOUNTER — Other Ambulatory Visit: Payer: Self-pay | Admitting: Nurse Practitioner

## 2022-10-19 DIAGNOSIS — F0280 Dementia in other diseases classified elsewhere without behavioral disturbance: Secondary | ICD-10-CM

## 2022-10-19 MED ORDER — ALPRAZOLAM 0.25 MG PO TABS: 0.2500 mg | ORAL_TABLET | Freq: Three times a day (TID) | ORAL | 0 refills | Status: DC | PRN

## 2022-10-19 MED ORDER — ALPRAZOLAM 0.5 MG PO TABS
0.2500 mg | ORAL_TABLET | Freq: Three times a day (TID) | ORAL | Status: DC | PRN
  Administered 2022-10-19: 0.25 mg via ORAL
  Filled 2022-10-19: qty 1

## 2022-10-19 MED ORDER — METHOCARBAMOL 500 MG PO TABS: 500.0000 mg | ORAL_TABLET | Freq: Four times a day (QID) | ORAL | 0 refills | Status: DC | PRN

## 2022-10-19 NOTE — Progress Notes (Addendum)
11:30am: CSW spoke with patient's daughter [name] who confirms scheduled interview today at 2pm. CSWinformed [name] that DME will be delivered today and [place] will be provided by [facility]. CSW faxed [place] orders to [place] [facility] [place].  CSW spoke with Brenton Grills at [facility] who states the agency is unable to deliver a bed today.  CSW spoke with Erasmo Downer at [facility] who states the agency will deliver the equipment today and contact [name] for delivery arrangements.  10:50am: CSW received message from [facility] stating patient's daughter has cancelled hospice services due to wanting patient to receive [facility] PT at [facility].  9:30am: CSW spoke with bedside RN who states patient's daughter informed her of a scheduled visit for today at 2pm for an interview with a potential sitter.  Madilyn Fireman, MSW, LCSW Transitions of Care  Clinical Social Worker II [phone]

## 2022-10-19 NOTE — Progress Notes (Signed)
Mobility Specialist - Progress Note   10/19/22 1001  Mobility  Activity Ambulated with assistance in hallway  Level of Assistance Minimal assist, patient does 75% or more  Assistive Device Front wheel walker  Distance Ambulated (ft) 60 ft  Activity Response Tolerated well  Mobility Referral Yes  $Mobility charge 1 Mobility   Pt was received in bed and agreeable to mobility. Pt was ModA throughout session and required multiple vocal cues during session as well. Pt was left in chair with all needs met and chair alarm on.   Franki Monte  Mobility Specialist Please contact via Solicitor or Rehab office at [phone]

## 2022-10-19 NOTE — Progress Notes (Signed)
    Durable Medical Equipment  (From admission, onward)           Start     Ordered   10/18/22 0826  For home use only DME 4 wheeled rolling walker with seat  Once       Question:  Patient needs a walker to treat with the following condition  Answer:  Weakness   10/18/22 0825   10/18/22 0824  For home use only DME Bedside commode  Once       Question:  Patient needs a bedside commode to treat with the following condition  Answer:  Weakness   10/18/22 0825   10/18/22 0823  For home use only DME Hospital bed  Once       Question Answer Comment  Length of Need 6 Months   Bed type Semi-electric      10/18/22 0825   10/15/22 1006  For home use only DME oxygen  Once       Question Answer Comment  Length of Need Lifetime   Mode or (Route) Nasal cannula   Liters per Minute 1   Frequency Continuous (stationary and portable oxygen unit needed)   Oxygen conserving device Yes   Oxygen delivery system Gas      10/15/22 1005

## 2022-10-19 NOTE — Progress Notes (Signed)
Patient is waiting to be discharged to ALF.  Please refer to the discharge summary done by me on 10/15/2022 and 10/18/2022 for full details.  Patient seen and examined at bedside.  Currently medically stable for discharge.

## 2022-10-20 DIAGNOSIS — S72002A Fracture of unspecified part of neck of left femur, initial encounter for closed fracture: Secondary | ICD-10-CM | POA: Diagnosis not present

## 2022-10-20 MED ORDER — QUETIAPINE FUMARATE 25 MG PO TABS
50.0000 mg | ORAL_TABLET | Freq: Every day | ORAL | Status: DC
  Administered 2022-10-20: 50 mg via ORAL
  Filled 2022-10-20: qty 2

## 2022-10-20 MED ORDER — QUETIAPINE FUMARATE 25 MG PO TABS: 50.0000 mg | ORAL_TABLET | Freq: Every day | ORAL | Status: DC

## 2022-10-20 MED ORDER — ACETAMINOPHEN 500 MG PO TABS
1000.0000 mg | ORAL_TABLET | Freq: Four times a day (QID) | ORAL | Status: DC
  Administered 2022-10-20 – 2022-10-21 (×3): 1000 mg via ORAL
  Filled 2022-10-20 (×3): qty 2

## 2022-10-20 NOTE — Progress Notes (Signed)
Patient"s family hired Charity fundraiser from [employer] Research scientist (physical sciences)...  main [phone], cell- [phone]). Bridgit Akita is sitting tonight from 10pm till 6 am .

## 2022-10-20 NOTE — Progress Notes (Addendum)
10:35am: CSW received message from MD stating he spoke with patient's daughter and the current plan is for patient to return to [facility] tomorrow with a sitter, [facility] services, and DME.  CSW notified Rollene Fare at [facility] (facility [facility] provider) of information.  9:50am: CSW spoke with patient's daughter [name] who states she is going to visit [employer] for a tour. CSW emphasized the need for discharge today.   CSW spoke with patient's daughter Eustaquio Maize who states she and her sister want to speak with MD before proceeding with any decision making. Beth states she wants to discuss yesterday's events and current medications with MD.  CSW notified MD of request.  Madilyn Fireman, MSW, LCSW Transitions of Care  Clinical Social Worker II [phone]

## 2022-10-20 NOTE — Progress Notes (Signed)
PROGRESS NOTE    Emily Dawson  [id number] DOB: 05-11-1934 DOA: 10/13/2022 PCP: Edmonia Caprio, NP   Brief Narrative:  89 year old Alzheimer's, HTN, CKD, breast cancer status post bilateral mastectomy, arthritis, glaucoma admitted after unwitnessed fall.  Patient was at independent living facility, recently moved to memory care unit and fell at night.  She does wander around the house at night.  In the ED CT of the head and cervical spine did not show acute pathology, chest x-ray showed mild increased coarse bilateral infiltrate.  X-ray of the hip showed nondisplaced fracture of the left femoral neck, orthopedic was consulted.  Patient underwent left total hip replacement by Dr. Alvan Dame on 2/8. Surgically stable.  Remains in the hospital due to agitation, impulsiveness and difficulty controlling impulsive symptoms.  See discussion below.   Assessment & Plan:    Close traumatic left hip fracture: Left total hip 2/8.  Weightbearing as tolerated.  Continue to work with PT OT. Dementia symptoms making it difficult to assess for pain. Tylenol 1 g every 6 hours scheduled for next 24 hours and then will reassess. Avoid narcotics. DVT prophylaxis with aspirin 81 mg twice daily for 4 weeks.    Urinary retention, acute 800 cc of urinary retention in the ED. successful voiding trial.   Chronic kidney disease stage II Stable.  Creatinine stable around 1.0  Essential hypertension - Will resume home Toprol-XL.  IV as needed ordered   Alzheimer's dementia, advance with behavioral disturbances.  Appears patient has advanced dementia and does not appear to be a good rehab candidate. Patient remains with difficulty controlling symptoms including sundowning, getting impulsive at night.  Fall precautions.  Delirium precautions.   Detailed discussion with 2 of her daughters on the phone today, Tylenol 1 g every 6 hours as scheduled Seroquel, increase dose to 50 mg at night.  Can use  Seroquel at 6 PM.  Continue to use melatonin and trazodone for sleep. Xanax 0.25 mg for acute uncontrolled symptoms. Family was in dilemma about either going to a SNF or going back to memory care unit.  Patient is full weightbearing on her hip with no restrictions and can walk.  Her memory care symptoms should prioritize taking care to her hip and therapies at the SNF.  I recommended patient's family that memory care unit may be more equipped to help her with her symptom control than a SNF place. Will change her medications today and monitor over next 24 hours.  Anticipate going back to memory care unit with home health therapies, 24/7 care and ultimate resumption of hospice.     DVT prophylaxis: Aspirin 81 mg twice daily. Code Status: DNR Family Communication: Patient's daughter is on the phone. Status is: Inpatient Remains inpatient appropriate because: Awaiting safe disposition    Subjective: Patient seen and examined.  She was sound asleep.  Did not wake her up.  Overnight events noted.  She was noted to have a ground-level fall yesterday after getting impulsive.  Now remains with TeleSitter. Detailed discussion with family as above.   Examination:  General: Looks calm comfortable.  Sleepy now.  Anxious on stimulation.  Frail and debilitated. Cardiovascular: S1-S2 normal.  Regular rate rhythm. Respiratory: Bilateral clear.  No added sounds. Gastrointestinal: Soft.  Nontender.  Bowel sound present. Ext: No swelling or edema.  No cyanosis.  Objective: Vitals:   10/19/22 1610 10/19/22 2027 10/20/22 0657 10/20/22 0743  BP: (!) 143/80 138/70 119/64 (!) 151/68  Pulse: 90 94 70 80  Resp: 15  $18 16 16  F$ Temp: 97.8 F (36.6 C) 98.4 F (36.9 C) 98 F (36.7 C) 97.7 F (36.5 C)  TempSrc:      SpO2: 100% 100% 98% 99%  Weight:      Height:        Intake/Output Summary (Last 24 hours) at 10/20/2022 1050 Last data filed at 10/19/2022 2130 Gross per 24 hour  Intake 100 ml  Output --   Net 100 ml   Filed Weights   10/13/22 1026  Weight: 40 kg     Data Reviewed:   CBC: Recent Labs  Lab 10/14/22 0412 10/15/22 0237  WBC 16.2* 13.5*  HGB 12.0 10.7*  HCT 36.1 29.7*  MCV 95.5 93.1  PLT 181 [id number]*   Basic Metabolic Panel: Recent Labs  Lab 10/14/22 0412 10/15/22 0237  NA 141 144  K 3.5 3.1*  CL 103 111  CO2 21* 24  GLUCOSE 150* 109*  BUN 28* 27*  CREATININE 1.09* 0.82  CALCIUM 8.7* 8.2*  MG  --  2.1   GFR: Estimated Creatinine Clearance: 31.1 mL/min (by C-G formula based on SCr of 0.82 mg/dL). Liver Function Tests: No results for input(s): "AST", "ALT", "ALKPHOS", "BILITOT", "PROT", "ALBUMIN" in the last 168 hours. No results for input(s): "LIPASE", "AMYLASE" in the last 168 hours. No results for input(s): "AMMONIA" in the last 168 hours. Coagulation Profile: No results for input(s): "INR", "PROTIME" in the last 168 hours.  Cardiac Enzymes: No results for input(s): "CKTOTAL", "CKMB", "CKMBINDEX", "TROPONINI" in the last 168 hours. BNP (last 3 results) No results for input(s): "PROBNP" in the last 8760 hours. HbA1C: No results for input(s): "HGBA1C" in the last 72 hours. CBG: No results for input(s): "GLUCAP" in the last 168 hours. Lipid Profile: No results for input(s): "CHOL", "HDL", "LDLCALC", "TRIG", "CHOLHDL", "LDLDIRECT" in the last 72 hours. Thyroid Function Tests: No results for input(s): "TSH", "T4TOTAL", "FREET4", "T3FREE", "THYROIDAB" in the last 72 hours. Anemia Panel: No results for input(s): "VITAMINB12", "FOLATE", "FERRITIN", "TIBC", "IRON", "RETICCTPCT" in the last 72 hours. Sepsis Labs: No results for input(s): "PROCALCITON", "LATICACIDVEN" in the last 168 hours.   Recent Results (from the past 240 hour(s))  Surgical pcr screen     Status: None   Collection Time: 10/13/22  6:17 AM   Specimen: Nasal Mucosa; Nasal Swab  Result Value Ref Range Status   MRSA, PCR NEGATIVE NEGATIVE Final   Staphylococcus aureus NEGATIVE  NEGATIVE Final    Comment: (NOTE) The Xpert SA Assay (FDA approved for NASAL specimens in patients 38 years of age and older), is one component of a comprehensive surveillance program. It is not intended to diagnose infection nor to guide or monitor treatment. Performed at [facility], [place] [address], [place], [place] [address]   Resp panel by RT-PCR (RSV, Flu A&B, Covid) Anterior Nasal Swab     Status: None   Collection Time: 10/13/22  7:24 AM   Specimen: Anterior Nasal Swab  Result Value Ref Range Status   SARS Coronavirus 2 by RT PCR NEGATIVE NEGATIVE Final   Influenza A by PCR NEGATIVE NEGATIVE Final   Influenza B by PCR NEGATIVE NEGATIVE Final    Comment: (NOTE) The Xpert Xpress SARS-CoV-2/FLU/RSV plus assay is intended as an aid in the diagnosis of influenza from Nasopharyngeal swab specimens and should not be used as a sole basis for treatment. Nasal washings and aspirates are unacceptable for Xpert Xpress SARS-CoV-2/FLU/RSV testing.  Fact Sheet for Patients: EntrepreneurPulse.com.au  Fact Sheet for Healthcare  Providers: IncredibleEmployment.be  This test is not yet approved or cleared by the [place] and has been authorized for detection and/or diagnosis of SARS-CoV-2 by FDA under an Emergency Use Authorization (EUA). This EUA will remain in effect (meaning this test can be used) for the duration of the COVID-19 declaration under Section 564(b)(1) of the Act, 21 U.S.C. section 360bbb-3(b)(1), unless the authorization is terminated or revoked.     Resp Syncytial Virus by PCR NEGATIVE NEGATIVE Final    Comment: (NOTE) Fact Sheet for Patients: EntrepreneurPulse.com.au  Fact Sheet for Healthcare Providers: IncredibleEmployment.be  This test is not yet approved or cleared by the [place] FDA and has been authorized for detection and/or diagnosis of SARS-CoV-2 by FDA under an  Emergency Use Authorization (EUA). This EUA will remain in effect (meaning this test can be used) for the duration of the COVID-19 declaration under Section 564(b)(1) of the Act, 21 U.S.C. section 360bbb-3(b)(1), unless the authorization is terminated or revoked.  Performed at [facility], [place] [address], [place], [place] [address]          Radiology Studies: No results found.      Scheduled Meds:  acetaminophen  1,000 mg Oral Q6H   amLODipine  10 mg Oral Daily   aspirin  81 mg Oral BID   Chlorhexidine Gluconate Cloth  6 each Topical Q0600   docusate sodium  100 mg Oral BID   feeding supplement  237 mL Oral BID BM   melatonin  3 mg Oral QHS   metoprolol succinate  100 mg Oral Daily   multivitamin with minerals  1 tablet Oral Daily   polyethylene glycol  17 g Oral BID   QUEtiapine  50 mg Oral QHS   senna-docusate  1 tablet Oral QHS   Continuous Infusions:  methocarbamol (ROBAXIN) IV Stopped (10/14/22 0235)     LOS: 7 days   Time spent= 35 mins    Barb Merino, MD [facility]

## 2022-10-20 NOTE — Progress Notes (Signed)
Mobility Specialist - Progress Note   10/20/22 1439  Mobility  Activity Ambulated with assistance in hallway  Level of Assistance Moderate assist, patient does 50-74%  Assistive Device Front wheel walker  Distance Ambulated (ft) 80 ft  Activity Response Tolerated well  Mobility Referral Yes  $Mobility charge 1 Mobility   Pt was received in bed and agreeable to mobility. Pt was MinA from sit to stand and ModA throughout ambulation. Pt need cues to keep walker straight while ambulating. Pt was returned to bed with all needs met and bed alarm on.   Emily Dawson  Mobility Specialist Please contact via Solicitor or Rehab office at [phone]

## 2022-10-21 ENCOUNTER — Other Ambulatory Visit (HOSPITAL_COMMUNITY): Payer: Self-pay

## 2022-10-21 ENCOUNTER — Ambulatory Visit: Payer: Medicare Other | Admitting: Nurse Practitioner

## 2022-10-21 DIAGNOSIS — S72002A Fracture of unspecified part of neck of left femur, initial encounter for closed fracture: Secondary | ICD-10-CM | POA: Diagnosis not present

## 2022-10-21 MED ORDER — SENNOSIDES-DOCUSATE SODIUM 8.6-50 MG PO TABS
1.0000 | ORAL_TABLET | Freq: Every day | ORAL | 0 refills | Status: AC
  Filled 2022-10-21: qty 30, 30d supply, fill #0

## 2022-10-21 MED ORDER — ACETAMINOPHEN 500 MG PO TABS
1000.0000 mg | ORAL_TABLET | Freq: Three times a day (TID) | ORAL | 0 refills | Status: AC | PRN
  Filled 2022-10-21: qty 30, 5d supply, fill #0

## 2022-10-21 MED ORDER — AMLODIPINE BESYLATE 5 MG PO TABS
5.0000 mg | ORAL_TABLET | Freq: Every day | ORAL | 0 refills | Status: AC
  Filled 2022-10-21: qty 30, 30d supply, fill #0

## 2022-10-21 MED ORDER — ASPIRIN 81 MG PO CHEW
81.0000 mg | CHEWABLE_TABLET | Freq: Two times a day (BID) | ORAL | 0 refills | Status: AC
  Filled 2022-10-21: qty 42, 21d supply, fill #0

## 2022-10-21 MED ORDER — ALPRAZOLAM 0.25 MG PO TABS
0.2500 mg | ORAL_TABLET | Freq: Three times a day (TID) | ORAL | 0 refills | Status: DC | PRN
  Filled 2022-10-21: qty 10, 4d supply, fill #0

## 2022-10-21 MED ORDER — QUETIAPINE FUMARATE 50 MG PO TABS
50.0000 mg | ORAL_TABLET | Freq: Every day | ORAL | 0 refills | Status: AC
  Filled 2022-10-21: qty 30, 30d supply, fill #0

## 2022-10-21 MED ORDER — ASPIRIN 81 MG PO CHEW: 81.0000 mg | CHEWABLE_TABLET | Freq: Two times a day (BID) | ORAL | 0 refills | Status: DC

## 2022-10-21 NOTE — Progress Notes (Signed)
Occupational Therapy Treatment Patient Details Name: Emily Dawson MRN: [id number] DOB: 01/27/1934 Today's Date: 10/21/2022   History of present illness Pt is an 89 y/o female who presents s/p unwitnessed fall at memory care ([facility]) where she moved to about 3 days PTA. She sustained a L femoral neck fx and is now s/p L total hip replacement (anterior approach) on 10/13/2022. PMH significant for Alzheimer's dementia, Breast CA s/p bilateral mastectomy), CKD, glaucoma, HTN, osteoporosis, retinal vein occlusion L eye, thyroid disease.   OT comments  Patient demonstrated good gains on this treatment session with min guard assist to get to EOB and able to transfer to [facility] with RW and min assist. Patient continues to require max assist for clothing management and hygiene due to reliant on UE support when standing. Patient able to ambulate to sink to perform grooming tasks and required seated rest break following. Patient discharge plans continue to be appropriate for [facility] with continued rehab to increase independence and safety with functional transfers and self care. Acute OT to continue to follow.    Recommendations for follow up therapy are one component of a multi-disciplinary discharge planning process, led by the attending physician.  Recommendations may be updated based on patient status, additional functional criteria and insurance authorization.    Follow Up Recommendations  Skilled nursing-short term rehab (<3 hours/day)     Assistance Recommended at Discharge Frequent or constant Supervision/Assistance  Patient can return home with the following  A lot of help with walking and/or transfers;A lot of help with bathing/dressing/bathroom;Assistance with cooking/housework;Assistance with feeding;Direct supervision/assist for medications management;Direct supervision/assist for financial management;Assist for transportation;Help with stairs or ramp for entrance   Equipment  Recommendations  Other (comment) (defer)    Recommendations for Other Services      Precautions / Restrictions Precautions Precautions: Fall Precaution Comments: advanced dementia Restrictions Weight Bearing Restrictions: No       Mobility Bed Mobility Overal bed mobility: Needs Assistance Bed Mobility: Supine to Sit     Supine to sit: Min guard     General bed mobility comments: cues for rail cues and min guard    Transfers Overall transfer level: Needs assistance Equipment used: Rolling walker (2 wheels) Transfers: Sit to/from Stand, Bed to chair/wheelchair/BSC Sit to Stand: Min assist     Step pivot transfers: Min assist     General transfer comment: cues for safety and correct walker use     Balance Overall balance assessment: Needs assistance Sitting-balance support: Feet supported Sitting balance-Leahy Scale: Fair     Standing balance support: Bilateral upper extremity supported, During functional activity Standing balance-Leahy Scale: Poor Standing balance comment: reliant on RW or sink for support                           ADL either performed or assessed with clinical judgement   ADL Overall ADL's : Needs assistance/impaired     Grooming: Wash/dry hands;Wash/dry face;Oral care;Minimal assistance;Standing Grooming Details (indicate cue type and reason): stood at sink             Lower Body Dressing: Moderate assistance Lower Body Dressing Details (indicate cue type and reason): to doff and donn socks Toilet Transfer: Minimal assistance;BSC/3in1;Rolling walker (2 wheels) Toilet Transfer Details (indicate cue type and reason): cues for walker use and safety Toileting- Clothing Manipulation and Hygiene: Maximal assistance;Sit to/from stand Toileting - Clothing Manipulation Details (indicate cue type and reason): for clothing management and hygiene  General ADL Comments: required frequent cues for all tasks     Extremity/Trunk Assessment              Vision       Perception     Praxis      Cognition Arousal/Alertness: Awake/alert Behavior During Therapy: WFL for tasks assessed/performed Overall Cognitive Status: History of cognitive impairments - at baseline                                 General Comments: frequent cues for safeyt, advanced dementia        Exercises      Shoulder Instructions       General Comments      Pertinent Vitals/ Pain       Pain Assessment Pain Assessment: No/denies pain Pain Intervention(s): Monitored during session  Home Living                                          Prior Functioning/Environment              Frequency  Min 2X/week        Progress Toward Goals  OT Goals(current goals can now be found in the care plan section)  Progress towards OT goals: Progressing toward goals  Acute Rehab OT Goals OT Goal Formulation: Patient unable to participate in goal setting Time For Goal Achievement: 10/29/22 Potential to Achieve Goals: Good ADL Goals Pt Will Perform Grooming: sitting;with min guard assist Pt Will Perform Upper Body Dressing: with set-up;sitting Pt Will Perform Lower Body Dressing: with min assist;sit to/from stand Pt Will Transfer to Toilet: with min assist;ambulating  Plan Discharge plan remains appropriate    Co-evaluation                 AM-PAC OT "6 Clicks" Daily Activity     Outcome Measure   Help from another person eating meals?: A Little Help from another person taking care of personal grooming?: A Lot Help from another person toileting, which includes using toliet, bedpan, or urinal?: A Lot Help from another person bathing (including washing, rinsing, drying)?: A Lot Help from another person to put on and taking off regular upper body clothing?: A Lot Help from another person to put on and taking off regular lower body clothing?: A Lot 6 Click Score:  13    End of Session Equipment Utilized During Treatment: Gait belt;Rolling walker (2 wheels)  OT Visit Diagnosis: Unsteadiness on feet (R26.81);Other abnormalities of gait and mobility (R26.89);Muscle weakness (generalized) (M62.81);Pain;Repeated falls (R29.6);History of falling (Z91.81)   Activity Tolerance Patient tolerated treatment well   Patient Left in chair;with call bell/phone within reach;with chair alarm set   Nurse Communication Mobility status        Time: 0802-0826 OT Time Calculation (min): 24 min  Charges: OT General Charges $OT Visit: 1 Visit OT Treatments $Self Care/Home Management : 23-37 mins  Lodema Hong, [place]  Office [place] 10/21/2022, 11:30 AM

## 2022-10-21 NOTE — Progress Notes (Signed)
Patient received first dose of seroquel 50 mg at 1736 yesterday, as well as, melatonin 60m at 2123 . Patient slept most of night till around 4am and used the bedside commode.Patient went back to sleep after that. Her  vital signs remained stable . Patient took her meds last night crushed in pudding.

## 2022-10-21 NOTE — Discharge Summary (Signed)
Physician Discharge Summary  Israela Kelting [id number] DOB: 05-Dec-1933 DOA: 10/13/2022  PCP: Edmonia Caprio, NP  Admit date: 10/13/2022 Discharge date: 10/21/2022  Admitted From: ALF Disposition: ALF     Recommendations for Outpatient Follow-up:  outpatient follow-up with hospice at ALF Outpatient follow-up with palliative care    Home Health: PT/OT Equipment/Devices: Available at ALF Discharge Condition: Fair CODE STATUS: DNR Diet recommendation: Heart healthy diet, nutritional supplements.  Discharge summary: 89 year old Alzheimer's, HTN, CKD, breast cancer status post bilateral mastectomy, arthritis, glaucoma admitted after unwitnessed fall.  Patient was at independent living facility, recently moved to memory care unit and fell at night.  She does wander around the house at night.  In the ED CT of the head and cervical spine did not show acute pathology, chest x-ray showed mild increased coarse bilateral infiltrate.  X-ray of the hip showed nondisplaced fracture of the left femoral neck, orthopedic was consulted.  Patient underwent left total hip replacement by Dr. Alvan Dame on 2/8. Surgically stable.  Remained in the hospital due to agitation, impulsiveness and difficulty controlling impulsive symptoms. Much improved today.  Able to go back to ALF today.     Assessment & Plan:    Close traumatic left hip fracture: Left total hip 2/8.  Weightbearing as tolerated.  Continue to work with PT OT. Pain control, Tylenol 1 g every 8 hours as needed.  Avoid all narcotics. DVT prophylaxis with aspirin 81 mg twice daily for 2 to 4 weeks, 3 weeks remaining now. Weightbearing as tolerated.    Urinary retention, acute 800 cc of urinary retention in the ED. successful voiding trial.   Chronic kidney disease stage II Stable.  Creatinine stable around 1.0   Essential hypertension - Will resume home Toprol-XL.     Alzheimer's dementia, advance with behavioral disturbances.   Patient  remained with difficulty controlling symptoms including sundowning, getting impulsive at night.  Fall precautions.  Delirium precautions.    Detailed discussion and evaluation the patient, family involvement 2/15.   Symptom management with  Tylenol 1 g every 8 hours as needed. Seroquel, increase dose to 50 mg at night.  Can use Seroquel at 6 PM.  Continue to use melatonin and trazodone for sleep. Xanax 0.25 mg for acute uncontrolled symptoms.  Will provide prescriptions. Since patient is full weightbearing and able to ambulate, she should be able to go back to ALF at a memory care unit.  Family were able to arrange 24 hours bedside sitter for safety. Patient will benefit with ongoing palliative care and hospice follow-up.   Stable for discharge to memory care unit with continuous observation.         Discharge Instructions  Discharge Instructions     Diet - low sodium heart healthy   Complete by: As directed    Increase activity slowly   Complete by: As directed       Allergies as of 10/21/2022       Reactions   Nsaids    Ampicillin    Arimidex [anastrozole] Other (See Comments)   Kidney problems    Azithromycin    Celecoxib    Fosamax [alendronate] Other (See Comments)   Kidney problem    Ibuprofen    Naproxen    Penicillins    Sulfa Antibiotics    Tetracyclines & Related    Losartan Rash        Medication List     STOP taking these medications    memantine 5 MG tablet Commonly known  as: NAMENDA       TAKE these medications    acetaminophen 500 MG tablet Commonly known as: TYLENOL Take 2 tablets (1,000 mg total) by mouth every 8 (eight) hours as needed for moderate pain, fever or headache. What changed:  how much to take reasons to take this   ALPRAZolam 0.25 MG tablet Commonly known as: XANAX Take 1 tablet (0.25 mg total) by mouth 3 (three) times daily as needed for anxiety.   amLODipine 5 MG tablet Commonly known as: NORVASC Take 1 tablet  (5 mg total) by mouth daily.   aspirin 81 MG chewable tablet Chew 1 tablet (81 mg total) by mouth 2 (two) times daily for 21 days.   b complex vitamins capsule Take 1 capsule by mouth daily.   docusate sodium 100 MG capsule Commonly known as: COLACE Take 1 capsule (100 mg total) by mouth 2 (two) times daily.   dorzolamidel-timolol 22.3-6.8 MG/ML Soln ophthalmic solution Commonly known as: COSOPT Place 1 drop into both eyes 2 (two) times daily.   latanoprost 0.005 % ophthalmic solution Commonly known as: XALATAN Place 1 drop into both eyes at bedtime.   melatonin 3 MG Tabs tablet Take 3 mg by mouth at bedtime.   metoprolol succinate 100 MG 24 hr tablet Commonly known as: TOPROL-XL Take 1 tablet (100 mg total) by mouth daily. TAKE WITH OR IMMEDIATELY FOLLOWING A MEAL.   mineral oil-hydrophilic petrolatum ointment Apply topically as needed for dry skin. Both legs What changed:  how much to take additional instructions   polyethylene glycol 17 g packet Commonly known as: MIRALAX / GLYCOLAX Take 17 g by mouth daily as needed for moderate constipation.   QUEtiapine 50 MG tablet Commonly known as: SEROQUEL Take 1 tablet (50 mg total) by mouth at bedtime. What changed:  medication strength how much to take   Refresh Optive Advanced PF 0.5-1-0.5 % Soln Generic drug: Carboxymeth-Glyc-Polysorb PF Place 1 drop into both eyes at bedtime.   senna-docusate 8.6-50 MG tablet Commonly known as: Senokot-S Take 1 tablet by mouth at bedtime.   VITAMIN D (CHOLECALCIFEROL) PO Take 2,000 Units by mouth daily.               Durable Medical Equipment  (From admission, onward)           Start     Ordered   10/18/22 0826  For home use only DME 4 wheeled rolling walker with seat  Once       Question:  Patient needs a walker to treat with the following condition  Answer:  Weakness   10/18/22 0825   10/18/22 0824  For home use only DME Bedside commode  Once       Question:   Patient needs a bedside commode to treat with the following condition  Answer:  Weakness   10/18/22 0825   10/18/22 0823  For home use only DME Hospital bed  Once       Question Answer Comment  Length of Need 6 Months   Bed type Semi-electric      10/18/22 0825   10/15/22 1006  For home use only DME oxygen  Once       Question Answer Comment  Length of Need Lifetime   Mode or (Route) Nasal cannula   Liters per Minute 1   Frequency Continuous (stationary and portable oxygen unit needed)   Oxygen conserving device Yes   Oxygen delivery system Gas      10/15/22 1005  Follow-up Information     Paralee Cancel, MD. Schedule an appointment as soon as possible for a visit in 1 week(s).   Specialty: Orthopedic Surgery Contact information: [address] [place] [address] [place] [address] [phone]                Allergies  Allergen Reactions   Nsaids    Ampicillin    Arimidex [Anastrozole] Other (See Comments)    Kidney problems    Azithromycin    Celecoxib    Fosamax [Alendronate] Other (See Comments)    Kidney problem    Ibuprofen    Naproxen    Penicillins    Sulfa Antibiotics    Tetracyclines & Related    Losartan Rash    Consultations: Orthopedics   Procedures/Studies: DG Pelvis Portable  Result Date: 10/13/2022 CLINICAL DATA:  Status post left total hip arthroplasty. EXAM: PORTABLE PELVIS 1-2 VIEWS COMPARISON:  Radiographs of same day. FINDINGS: The left femoral and acetabular components are well situated. Expected postoperative changes seen in the surrounding soft tissues. IMPRESSION: Status post left total hip arthroplasty. Electronically Signed   By: Marijo Conception M.D.   On: 10/13/2022 15:24   DG C-Arm 1-60 Min-No Report  Result Date: 10/13/2022 Fluoroscopy was utilized by the requesting physician.  No radiographic interpretation.   DG Chest Port 1 View  Result Date: 10/13/2022 CLINICAL DATA:  89 year old female status  post fall with left femoral neck fracture. EXAM: PORTABLE CHEST 1 VIEW COMPARISON:  Left hip series 0305 hours today. Cervical spine CT today. FINDINGS: Portable AP supine view at 0402 hours. Lung volumes and cardiac size are at the upper limits of normal. Other mediastinal contours are within normal limits. Visualized tracheal air column is within normal limits. Right greater than left axillary surgical clips. No pneumothorax, pleural effusion or confluent lung opacity. Diffuse mildly increased and coarse bilateral pulmonary interstitial opacity is nonspecific. Lung apices on cervical spine CT today demonstrated only mild scarring. Oval osseous structures appear intact. IMPRESSION: Nonspecific mildly increased, coarse bilateral pulmonary interstitial opacity with no previous exams. These could be chronic interstitial lung changes. In the appropriate setting of viral/atypical respiratory infection would be difficult to exclude. But otherwise no acute cardiopulmonary abnormality. Electronically Signed   By: Genevie Ann M.D.   On: 10/13/2022 04:16   CT Cervical Spine Wo Contrast  Result Date: 10/13/2022 CLINICAL DATA:  Neck trauma (Age >= 65y).  Unwitnessed fall EXAM: CT CERVICAL SPINE WITHOUT CONTRAST TECHNIQUE: Multidetector CT imaging of the cervical spine was performed without intravenous contrast. Multiplanar CT image reconstructions were also generated. RADIATION DOSE REDUCTION: This exam was performed according to the departmental dose-optimization program which includes automated exposure control, adjustment of the mA and/or kV according to patient size and/or use of iterative reconstruction technique. COMPARISON:  None Available. FINDINGS: Alignment: Normal Skull base and vertebrae: No acute fracture. No primary bone lesion or focal pathologic process. Soft tissues and spinal canal: No prevertebral fluid or swelling. No visible canal hematoma. Disc levels: Degenerative disc disease at C5-6 and C6-7 with disc  space narrowing and spurring. Moderate bilateral degenerative facet disease, right greater than left. Fusion across the right C2-3 facet. Upper chest: Biapical scarring.  No acute findings. Other: None IMPRESSION: Degenerative disc and facet disease.  No acute bony abnormality. Electronically Signed   By: Rolm Baptise M.D.   On: 10/13/2022 03:31   CT Head Wo Contrast  Result Date: 10/13/2022 CLINICAL DATA:  Head trauma, minor (Age >= 65y).  Fall. EXAM: CT HEAD WITHOUT CONTRAST TECHNIQUE: Contiguous axial images were obtained from the base of the skull through the vertex without intravenous contrast. RADIATION DOSE REDUCTION: This exam was performed according to the departmental dose-optimization program which includes automated exposure control, adjustment of the mA and/or kV according to patient size and/or use of iterative reconstruction technique. COMPARISON:  None Available. FINDINGS: Brain: There is atrophy and chronic small vessel disease changes. No acute intracranial abnormality. Specifically, no hemorrhage, hydrocephalus, mass lesion, acute infarction, or significant intracranial injury. Vascular: No hyperdense vessel or unexpected calcification. Skull: No acute calvarial abnormality. Sinuses/Orbits: No acute findings Other: None IMPRESSION: Atrophy, chronic microvascular disease. No acute intracranial abnormality. Electronically Signed   By: Rolm Baptise M.D.   On: 10/13/2022 03:29   DG Hip Unilat W or Wo Pelvis 2-3 Views Left  Result Date: 10/13/2022 CLINICAL DATA:  Unwitnessed fall EXAM: DG HIP (WITH OR WITHOUT PELVIS) 2-3V LEFT COMPARISON:  None Available. FINDINGS: Degenerative changes in the left hip with joint space narrowing and sclerosis and osteophyte formation. Mild deformity of the left hip with cortical irregularity along the femoral neck likely represents an impacted acute fracture. No inter trochanteric involvement is suggested. Pelvis and sacrum appear intact. Surgical clips in the  pelvis. IMPRESSION: Nondisplaced impacted fracture of the left femoral neck. Degenerative changes in the left hip. Electronically Signed   By: Lucienne Capers M.D.   On: 10/13/2022 03:24      Subjective: Patient seen and examined.  Today she is more interactive.  Looks quiet and calm.  She was able to answer her name but she was easily distractible.   Discharge Exam: Vitals:   10/21/22 0608 10/21/22 0943  BP: (!) 107/50 133/71  Pulse: 78 75  Resp: 17 16  Temp: 97.6 F (36.4 C) 98.2 F (36.8 C)  SpO2: 97% 100%    Looks calm and quiet.  Alert and awake but not oriented.  Comfortable.  Tries to answer questions but does not answer appropriately.   The results of significant diagnostics from this hospitalization (including imaging, microbiology, ancillary and laboratory) are listed below for reference.     Microbiology: Recent Results (from the past 240 hour(s))  Surgical pcr screen     Status: None   Collection Time: 10/13/22  6:17 AM   Specimen: Nasal Mucosa; Nasal Swab  Result Value Ref Range Status   MRSA, PCR NEGATIVE NEGATIVE Final   Staphylococcus aureus NEGATIVE NEGATIVE Final    Comment: (NOTE) The Xpert SA Assay (FDA approved for NASAL specimens in patients 22 years of age and older), is one component of a comprehensive surveillance program. It is not intended to diagnose infection nor to guide or monitor treatment. Performed at [facility], [place] [address], [place], [place] [address]   Resp panel by RT-PCR (RSV, Flu A&B, Covid) Anterior Nasal Swab     Status: None   Collection Time: 10/13/22  7:24 AM   Specimen: Anterior Nasal Swab  Result Value Ref Range Status   SARS Coronavirus 2 by RT PCR NEGATIVE NEGATIVE Final   Influenza A by PCR NEGATIVE NEGATIVE Final   Influenza B by PCR NEGATIVE NEGATIVE Final    Comment: (NOTE) The Xpert Xpress SARS-CoV-2/FLU/RSV plus assay is intended as an aid in the diagnosis of influenza from Nasopharyngeal swab  specimens and should not be used as a sole basis for treatment. Nasal washings and aspirates are unacceptable for Xpert Xpress SARS-CoV-2/FLU/RSV testing.  Fact Sheet for Patients: EntrepreneurPulse.com.au  Fact Sheet for Healthcare Providers: IncredibleEmployment.be  This test is not yet approved or cleared by the [place] FDA and has been authorized for detection and/or diagnosis of SARS-CoV-2 by FDA under an Emergency Use Authorization (EUA). This EUA will remain in effect (meaning this test can be used) for the duration of the COVID-19 declaration under Section 564(b)(1) of the Act, 21 U.S.C. section 360bbb-3(b)(1), unless the authorization is terminated or revoked.     Resp Syncytial Virus by PCR NEGATIVE NEGATIVE Final    Comment: (NOTE) Fact Sheet for Patients: EntrepreneurPulse.com.au  Fact Sheet for Healthcare Providers: IncredibleEmployment.be  This test is not yet approved or cleared by the [place] FDA and has been authorized for detection and/or diagnosis of SARS-CoV-2 by FDA under an Emergency Use Authorization (EUA). This EUA will remain in effect (meaning this test can be used) for the duration of the COVID-19 declaration under Section 564(b)(1) of the Act, 21 U.S.C. section 360bbb-3(b)(1), unless the authorization is terminated or revoked.  Performed at [facility], [place] [address] [place],  [address]      Labs: BNP (last 3 results) No results for input(s): "BNP" in the last 8760 hours. Basic Metabolic Panel: Recent Labs  Lab 10/15/22 0237  NA 144  K 3.1*  CL 111  CO2 24  GLUCOSE 109*  BUN 27*  CREATININE 0.82  CALCIUM 8.2*  MG 2.1   Liver Function Tests: No results for input(s): "AST", "ALT", "ALKPHOS", "BILITOT", "PROT", "ALBUMIN" in the last 168 hours. No results for input(s): "LIPASE", "AMYLASE" in the last 168 hours. No results for input(s):  "AMMONIA" in the last 168 hours. CBC: Recent Labs  Lab 10/15/22 0237  WBC 13.5*  HGB 10.7*  HCT 29.7*  MCV 93.1  PLT 143*   Cardiac Enzymes: No results for input(s): "CKTOTAL", "CKMB", "CKMBINDEX", "TROPONINI" in the last 168 hours. BNP: Invalid input(s): "POCBNP" CBG: No results for input(s): "GLUCAP" in the last 168 hours. D-Dimer No results for input(s): "DDIMER" in the last 72 hours. Hgb A1c No results for input(s): "HGBA1C" in the last 72 hours. Lipid Profile No results for input(s): "CHOL", "HDL", "LDLCALC", "TRIG", "CHOLHDL", "LDLDIRECT" in the last 72 hours. Thyroid function studies No results for input(s): "TSH", "T4TOTAL", "T3FREE", "THYROIDAB" in the last 72 hours.  Invalid input(s): "FREET3" Anemia work up No results for input(s): "VITAMINB12", "FOLATE", "FERRITIN", "TIBC", "IRON", "RETICCTPCT" in the last 72 hours. Urinalysis    Component Value Date/Time   COLORURINE STRAW (A) 10/13/2022 0815   APPEARANCEUR CLEAR 10/13/2022 0815   LABSPEC 1.012 10/13/2022 0815   PHURINE 7.0 10/13/2022 0815   GLUCOSEU 50 (A) 10/13/2022 0815   HGBUR NEGATIVE 10/13/2022 0815   BILIRUBINUR NEGATIVE 10/13/2022 0815   BILIRUBINUR Negative 03/19/2021 1449   KETONESUR 20 (A) 10/13/2022 0815   PROTEINUR 30 (A) 10/13/2022 0815   UROBILINOGEN negative (A) 03/19/2021 1449   NITRITE NEGATIVE 10/13/2022 0815   LEUKOCYTESUR NEGATIVE 10/13/2022 0815   Sepsis Labs Recent Labs  Lab 10/15/22 0237  WBC 13.5*   Microbiology Recent Results (from the past 240 hour(s))  Surgical pcr screen     Status: None   Collection Time: 10/13/22  6:17 AM   Specimen: Nasal Mucosa; Nasal Swab  Result Value Ref Range Status   MRSA, PCR NEGATIVE NEGATIVE Final   Staphylococcus aureus NEGATIVE NEGATIVE Final    Comment: (NOTE) The Xpert SA Assay (FDA approved for NASAL specimens in patients 82 years of age and older), is one component of a  comprehensive surveillance program. It is not intended to  diagnose infection nor to guide or monitor treatment. Performed at [facility], [place] [address], [place], [place] [address]   Resp panel by RT-PCR (RSV, Flu A&B, Covid) Anterior Nasal Swab     Status: None   Collection Time: 10/13/22  7:24 AM   Specimen: Anterior Nasal Swab  Result Value Ref Range Status   SARS Coronavirus 2 by RT PCR NEGATIVE NEGATIVE Final   Influenza A by PCR NEGATIVE NEGATIVE Final   Influenza B by PCR NEGATIVE NEGATIVE Final    Comment: (NOTE) The Xpert Xpress SARS-CoV-2/FLU/RSV plus assay is intended as an aid in the diagnosis of influenza from Nasopharyngeal swab specimens and should not be used as a sole basis for treatment. Nasal washings and aspirates are unacceptable for Xpert Xpress SARS-CoV-2/FLU/RSV testing.  Fact Sheet for Patients: EntrepreneurPulse.com.au  Fact Sheet for Healthcare Providers: IncredibleEmployment.be  This test is not yet approved or cleared by the [place] FDA and has been authorized for detection and/or diagnosis of SARS-CoV-2 by FDA under an Emergency Use Authorization (EUA). This EUA will remain in effect (meaning this test can be used) for the duration of the COVID-19 declaration under Section 564(b)(1) of the Act, 21 U.S.C. section 360bbb-3(b)(1), unless the authorization is terminated or revoked.     Resp Syncytial Virus by PCR NEGATIVE NEGATIVE Final    Comment: (NOTE) Fact Sheet for Patients: EntrepreneurPulse.com.au  Fact Sheet for Healthcare Providers: IncredibleEmployment.be  This test is not yet approved or cleared by the [place] FDA and has been authorized for detection and/or diagnosis of SARS-CoV-2 by FDA under an Emergency Use Authorization (EUA). This EUA will remain in effect (meaning this test can be used) for the duration of the COVID-19 declaration under Section 564(b)(1) of the Act, 21 U.S.C. section  360bbb-3(b)(1), unless the authorization is terminated or revoked.  Performed at [facility], [place] [address], [place],  [address]      Time coordinating discharge: 35 minutes  SIGNED:   Barb Merino, MD  [facility] 10/21/2022, 11:30 AM

## 2022-10-21 NOTE — TOC Transition Note (Signed)
[facility] called and arranged for patient pick up, states it will be a couple of hours. Attempted to call report to [place].

## 2022-10-21 NOTE — Progress Notes (Addendum)
CSW spoke with patient's daughter [name] who confirms DME has been delivered to [facility]. [name] confirms she was able to obtain a sitter through a private agency. [name] is aware that patient's medications are available for pick up at [facility] today.  Patient will receive [facility] and OT through [facility]. Patient will be transported via [facility] to [place].   CSW sent patient's discharge summary and FL2 to Tammy at [facility]. Tammy gave CSW permission to arrange for transportation.  CSW completed discharge packet - it is available at the nursing station. CSW spoke with Autumn, RN who states she will call [facility] now.  Madilyn Fireman, MSW, LCSW Transitions of Care  Clinical Social Worker II [phone]

## 2022-10-21 NOTE — Progress Notes (Signed)
Mobility Specialist - Progress Note   10/21/22 1443  Mobility  Activity Ambulated with assistance in room;Ambulated with assistance to bathroom  Level of Assistance Minimal assist, patient does 75% or more  Assistive Device Other (Comment) (HHA)  Distance Ambulated (ft) 20 ft  Activity Response Tolerated fair  Mobility Referral Yes  $Mobility charge 1 Mobility   Pt was received in chair and getting up. Pt was needing assistance to BR. Pt with successful BM. No complaints throughout. Pt was left on  stretcher with RN and [facility].   Franki Monte  Mobility Specialist Please contact via Solicitor or Rehab office at [phone]

## 2022-10-21 NOTE — Progress Notes (Signed)
Physical Therapy Treatment Patient Details Name: Emily Dawson MRN: [id number] DOB: 03/17/1934 Today's Date: 10/21/2022   History of Present Illness Pt is an 89 y/o female who presents s/p unwitnessed fall at memory care ([facility]) where she moved to about 3 days PTA. She sustained a L femoral neck fx and is now s/p L total hip replacement (anterior approach) on 10/13/2022. PMH significant for Alzheimer's dementia, Breast CA s/p bilateral mastectomy), CKD, glaucoma, HTN, osteoporosis, retinal vein occlusion L eye, thyroid disease.    PT Comments    Pt admitted with above diagnosis. Pt progressing ambulation distance and appeared to need a little less assist today as well as incr distance considerably.  Due to dementia, needs incr cuing overall. Pt frequency incr to 3x week as pt is progressing.  Will continue to follow acutely.  Pt currently with functional limitations due to balance and endruance deficits. Pt will benefit from skilled PT to increase their independence and safety with mobility to allow discharge to the venue listed below.      Recommendations for follow up therapy are one component of a multi-disciplinary discharge planning process, led by the attending physician.  Recommendations may be updated based on patient status, additional functional criteria and insurance authorization.  Follow Up Recommendations  Home health PT Can patient physically be transported by private vehicle: Yes   Assistance Recommended at Discharge Frequent or constant Supervision/Assistance  Patient can return home with the following A little help with bathing/dressing/bathroom;Assistance with cooking/housework;Assist for transportation;A lot of help with walking and/or transfers   Equipment Recommendations  None recommended by PT    Recommendations for Other Services       Precautions / Restrictions Precautions Precautions: Fall Precaution Comments: advanced dementia Restrictions Weight Bearing  Restrictions: No     Mobility  Bed Mobility               General bed mobility comments: pt in chair on arrival    Transfers Overall transfer level: Needs assistance Equipment used: Rolling walker (2 wheels) Transfers: Sit to/from Stand, Bed to chair/wheelchair/BSC Sit to Stand: Min assist           General transfer comment: cues for safety, for hand placement  and correct walker use    Ambulation/Gait Ambulation/Gait assistance: Min assist Gait Distance (Feet): 185 Feet Assistive device: Rolling walker (2 wheels) Gait Pattern/deviations: Step-through pattern, Decreased step length - left, Antalgic, Shuffle, Drifts right/left Gait velocity: decr Gait velocity interpretation: <1.31 ft/sec, indicative of household ambulator   General Gait Details: Frequent/constant min assist to facilitate continuous steps. Mildly antalgic gait pattern.Cues for direction throughout. No overt buckling or LOB. Drifts into furniture unless corrected. Cues for awareness.   Stairs             Wheelchair Mobility    Modified Rankin (Stroke Patients Only)       Balance Overall balance assessment: Needs assistance Sitting-balance support: Feet supported Sitting balance-Leahy Scale: Fair     Standing balance support: Bilateral upper extremity supported, During functional activity Standing balance-Leahy Scale: Poor Standing balance comment: reliant on RW or sink for support                            Cognition Arousal/Alertness: Awake/alert Behavior During Therapy: WFL for tasks assessed/performed Overall Cognitive Status: History of cognitive impairments - at baseline  General Comments: frequent cues for safety, advanced dementia        Exercises General Exercises - Lower Extremity Ankle Circles/Pumps: AAROM, Both, 10 reps, Seated Quad Sets: AROM, Both, 10 reps, Supine Long Arc Quad: Left, Seated, AAROM, Both,  10 reps Long Arc Quad Limitations: limited by command following Heel Slides: AROM, Both, 5 reps, Supine    General Comments General comments (skin integrity, edema, etc.): VSS      Pertinent Vitals/Pain Pain Assessment Pain Assessment: No/denies pain Breathing: normal Negative Vocalization: none Facial Expression: smiling or inexpressive Body Language: relaxed Consolability: no need to console PAINAD Score: 0    Home Living                          Prior Function            PT Goals (current goals can now be found in the care plan section) Acute Rehab PT Goals Patient Stated Goal: None stated Progress towards PT goals: Progressing toward goals    Frequency    Min 3X/week      PT Plan Current plan remains appropriate;Frequency needs to be updated    Co-evaluation              AM-PAC PT "6 Clicks" Mobility   Outcome Measure  Help needed turning from your back to your side while in a flat bed without using bedrails?: A Little Help needed moving from lying on your back to sitting on the side of a flat bed without using bedrails?: A Little Help needed moving to and from a bed to a chair (including a wheelchair)?: A Little Help needed standing up from a chair using your arms (e.g., wheelchair or bedside chair)?: A Little Help needed to walk in hospital room?: A Little Help needed climbing 3-5 steps with a railing? : Total 6 Click Score: 16    End of Session Equipment Utilized During Treatment: Gait belt Activity Tolerance: Patient tolerated treatment well Patient left: with call bell/phone within reach;in chair;with chair alarm set;with nursing/sitter in room ([place] alarm) Nurse Communication: Mobility status PT Visit Diagnosis: Unsteadiness on feet (R26.81);Pain;Difficulty in walking, not elsewhere classified (R26.2) Pain - Right/Left: Left Pain - part of body: Hip     Time: 0920-0943 PT Time Calculation (min) (ACUTE ONLY): 23  min  Charges:  $Gait Training: 8-22 mins $Therapeutic Exercise: 8-22 mins                     [facility] M,PT Acute Rehab Services [phone]    Alvira Philips 10/21/2022, 11:42 AM

## 2022-10-21 NOTE — NC FL2 (Signed)
[place] LEVEL OF CARE FORM     IDENTIFICATION  Patient Name: Emily Dawson Birthdate: Oct 09, 1933 Sex: female Admission Date (Current Location): 10/13/2022  [facility] and [place] Number:  Herbalist and Address:  [facility] [place] [address] [place], [place] [address]      Provider Number: [id number]  Attending Physician Name and Address:  Barb Merino, MD  Relative Name and Phone Number:       Current Level of Care: Hospital Recommended Level of Care: Memory Care Prior Approval Number:    Date Approved/Denied:   PASRR Number: [id number]  Discharge Plan: SNF    Current Diagnoses: Patient Active Problem List   Diagnosis Date Noted   Left displaced femoral neck fracture ([place]) 10/13/2022   Falls, initial encounter 10/13/2022   Leukocytosis 10/13/2022   Abnormal chest x-ray 10/13/2022   Dehydration 10/13/2022   Acute urinary retention 10/13/2022   S/P total left hip arthroplasty 10/13/2022   Palliative care encounter 05/10/2022   Osteoporosis    Vitamin D deficiency    Dementia ([place])    Hypertension    CKD (chronic kidney disease) stage 3, GFR 30-59 ml/min (HCC)    Low TSH level     Orientation RESPIRATION BLADDER Height & Weight     Self  Normal Continent Weight: 88 lb 2.9 oz (40 kg) Height:  5' (152.4 cm)  BEHAVIORAL SYMPTOMS/MOOD NEUROLOGICAL BOWEL NUTRITION STATUS      Continent Diet  AMBULATORY STATUS COMMUNICATION OF NEEDS Skin   Limited Assist Verbally Surgical wounds                       Personal Care Assistance Level of Assistance  Bathing, Feeding, Dressing Bathing Assistance: Limited assistance Feeding assistance: Limited assistance Dressing Assistance: Limited assistance     Functional Limitations Info  Sight, Hearing, Speech Sight Info: Adequate Hearing Info: Adequate Speech Info: Adequate    SPECIAL CARE FACTORS FREQUENCY  PT (By licensed PT), OT (By licensed OT)     PT  Frequency: 3x weekly - home health OT Frequency: 3x weekly - home health            Contractures Contractures Info: Not present    Additional Factors Info  Code Status, Allergies, Psychotropic Code Status Info: DNR Allergies Info: Nsaids  Ampicillin  Arimidex (Anastrozole)  Azithromycin  Celecoxib  Fosamax (Alendronate)  Ibuprofen  Naproxen  Penicillins  Sulfa Antibiotics  Tetracyclines & Related  Losartan Psychotropic Info: Seroquel, Ativan         Current Medications (10/21/2022):  This is the current hospital active medication list Current Facility-Administered Medications  Medication Dose Route Frequency Provider Last Rate Last Admin   acetaminophen (TYLENOL) tablet 1,000 mg  1,000 mg Oral Q6H Ghimire, Kuber, MD   1,000 mg at 10/21/22 1052   ALPRAZolam (XANAX) tablet 0.25 mg  0.25 mg Oral TID PRN Aline August, MD   0.25 mg at 10/19/22 1735   amLODipine (NORVASC) tablet 10 mg  10 mg Oral Daily Aline August, MD   10 mg at 10/21/22 0949   aspirin chewable tablet 81 mg  81 mg Oral BID Irving Copas, PA-C   81 mg at 10/21/22 0948   bisacodyl (DULCOLAX) suppository 10 mg  10 mg Rectal Daily PRN Irving Copas, PA-C       Chlorhexidine Gluconate Cloth 2 % PADS 6 each  6 each Topical Q0600 Irving Copas, PA-C   6 each  at 10/18/22 0645   diphenhydrAMINE (BENADRYL) 12.5 MG/5ML elixir 12.5-25 mg  12.5-25 mg Oral Q4H PRN Irving Copas, PA-C       docusate sodium (COLACE) capsule 100 mg  100 mg Oral BID Irving Copas, PA-C   100 mg at 10/20/22 2122   feeding supplement (ENSURE ENLIVE / ENSURE PLUS) liquid 237 mL  237 mL Oral BID BM Alekh, Kshitiz, MD   237 mL at 10/20/22 1324   guaiFENesin (ROBITUSSIN) 100 MG/5ML liquid 5 mL  5 mL Oral Q4H PRN Amin, Ankit Chirag, MD       haloperidol lactate (HALDOL) injection 1 mg  1 mg Intramuscular Q6H PRN Aline August, MD   1 mg at 10/18/22 1858   hydrALAZINE (APRESOLINE) injection 10 mg  10 mg Intravenous Q4H PRN Alekh, Kshitiz,  MD       ipratropium-albuterol (DUONEB) 0.5-2.5 (3) MG/3ML nebulizer solution 3 mL  3 mL Nebulization Q4H PRN Amin, Ankit Chirag, MD       melatonin tablet 3 mg  3 mg Oral QHS Amin, Ankit Chirag, MD   3 mg at 10/20/22 2123   menthol-cetylpyridinium (CEPACOL) lozenge 3 mg  1 lozenge Oral PRN Irving Copas, PA-C       Or   phenol (CHLORASEPTIC) mouth spray 1 spray  1 spray Mouth/Throat PRN Irving Copas, PA-C       methocarbamol (ROBAXIN) tablet 500 mg  500 mg Oral Q6H PRN Costella Hatcher R, PA-C   500 mg at 10/18/22 2324   Or   methocarbamol (ROBAXIN) 500 mg in dextrose 5 % 50 mL IVPB  500 mg Intravenous Q6H PRN Irving Copas, PA-C   Stopping previously hung infusion at 10/14/22 0235   metoCLOPramide (REGLAN) tablet 5-10 mg  5-10 mg Oral Q8H PRN Irving Copas, PA-C       Or   metoCLOPramide (REGLAN) injection 5-10 mg  5-10 mg Intravenous Q8H PRN Irving Copas, PA-C       metoprolol succinate (TOPROL-XL) 24 hr tablet 100 mg  100 mg Oral Daily Amin, Ankit Chirag, MD   100 mg at 10/21/22 0948   metoprolol tartrate (LOPRESSOR) injection 5 mg  5 mg Intravenous Q4H PRN Amin, Ankit Chirag, MD       morphine (PF) 2 MG/ML injection 0.5-1 mg  0.5-1 mg Intravenous Q2H PRN Costella Hatcher R, PA-C   1 mg at 10/16/22 1311   multivitamin with minerals tablet 1 tablet  1 tablet Oral Daily Aline August, MD   1 tablet at 10/21/22 0948   ondansetron (ZOFRAN) tablet 4 mg  4 mg Oral Q6H PRN Irving Copas, PA-C       Or   ondansetron (ZOFRAN) injection 4 mg  4 mg Intravenous Q6H PRN Costella Hatcher R, PA-C       polyethylene glycol (MIRALAX / GLYCOLAX) packet 17 g  17 g Oral BID Irving Copas, PA-C   17 g at 10/21/22 [id number]   QUEtiapine (SEROQUEL) tablet 50 mg  50 mg Oral QHS Barb Merino, MD   50 mg at 10/20/22 1736   senna-docusate (Senokot-S) tablet 1 tablet  1 tablet Oral QHS Irving Copas, PA-C   1 tablet at 10/20/22 2123   traZODone (DESYREL) tablet 50 mg  50 mg Oral QHS PRN  Damita Lack, MD   50 mg at 10/15/22 2015     Discharge Medications: Please see discharge summary for a list of discharge medications.  Relevant Imaging Results:  Relevant Lab Results:   Additional Information SSN: [id number]  Archie Endo, LCSW

## 2022-10-21 NOTE — Progress Notes (Signed)
Mobility Specialist - Progress Note   10/21/22 0901  Mobility  Activity Ambulated with assistance in hallway  Level of Assistance Minimal assist, patient does 75% or more  Assistive Device Front wheel walker  Distance Ambulated (ft) 120 ft  Activity Response Tolerated well  Mobility Referral Yes  $Mobility charge 1 Mobility   Pt was received in chair and agreeable to mobility. Pt was much more aware of surroundings to day and being able to avoid object in the hallway without physical or verbal cues. Pt was returned to chair with all needs met and chair alarm on.   Franki Monte  Mobility Specialist Please contact via Solicitor or Rehab office at [phone]

## 2022-11-02 ENCOUNTER — Emergency Department (HOSPITAL_COMMUNITY): Payer: Medicare Other

## 2022-11-02 ENCOUNTER — Emergency Department (HOSPITAL_COMMUNITY)
Admission: EM | Admit: 2022-11-02 | Discharge: 2022-11-03 | Disposition: A | Discharge status: Home Health | Payer: Medicare Other | Attending: Emergency Medicine | Admitting: Emergency Medicine

## 2022-11-02 ENCOUNTER — Other Ambulatory Visit: Payer: Self-pay

## 2022-11-02 ENCOUNTER — Non-Acute Institutional Stay: Payer: Medicare Other | Admitting: Hospice

## 2022-11-02 DIAGNOSIS — I129 Hypertensive chronic kidney disease with stage 1 through stage 4 chronic kidney disease, or unspecified chronic kidney disease: Secondary | ICD-10-CM | POA: Diagnosis not present

## 2022-11-02 DIAGNOSIS — F0280 Dementia in other diseases classified elsewhere without behavioral disturbance: Secondary | ICD-10-CM | POA: Insufficient documentation

## 2022-11-02 DIAGNOSIS — N189 Chronic kidney disease, unspecified: Secondary | ICD-10-CM | POA: Insufficient documentation

## 2022-11-02 DIAGNOSIS — S0101XA Laceration without foreign body of scalp, initial encounter: Secondary | ICD-10-CM | POA: Diagnosis present

## 2022-11-02 DIAGNOSIS — Z515 Encounter for palliative care: Secondary | ICD-10-CM

## 2022-11-02 DIAGNOSIS — M25552 Pain in left hip: Secondary | ICD-10-CM | POA: Diagnosis not present

## 2022-11-02 DIAGNOSIS — G309 Alzheimer's disease, unspecified: Secondary | ICD-10-CM | POA: Insufficient documentation

## 2022-11-02 DIAGNOSIS — Z853 Personal history of malignant neoplasm of breast: Secondary | ICD-10-CM | POA: Diagnosis not present

## 2022-11-02 DIAGNOSIS — W19XXXA Unspecified fall, initial encounter: Secondary | ICD-10-CM

## 2022-11-02 DIAGNOSIS — S72002A Fracture of unspecified part of neck of left femur, initial encounter for closed fracture: Secondary | ICD-10-CM

## 2022-11-02 DIAGNOSIS — R634 Abnormal weight loss: Secondary | ICD-10-CM

## 2022-11-02 DIAGNOSIS — F03918 Unspecified dementia, unspecified severity, with other behavioral disturbance: Secondary | ICD-10-CM

## 2022-11-02 DIAGNOSIS — Z7982 Long term (current) use of aspirin: Secondary | ICD-10-CM | POA: Insufficient documentation

## 2022-11-02 DIAGNOSIS — W06XXXA Fall from bed, initial encounter: Secondary | ICD-10-CM | POA: Insufficient documentation

## 2022-11-02 DIAGNOSIS — R531 Weakness: Secondary | ICD-10-CM

## 2022-11-02 MED ORDER — LIDOCAINE-EPINEPHRINE (PF) 2 %-1:200000 IJ SOLN
20.0000 mL | Freq: Once | INTRAMUSCULAR | Status: AC
  Administered 2022-11-03: 20 mL via INTRADERMAL
  Filled 2022-11-02: qty 20

## 2022-11-02 NOTE — ED Provider Notes (Signed)
[facility] Provider Note  CSN: [id number] Arrival date & time: 11/02/22 2244  Chief Complaint(s) Fall ED Triage Notes Samuella Cota, RN (Registered Nurse)  Date of Service: 11/02/2022 10:49 PM  Signed Patient had a witnessed fall from the bed, approximately 1"laceration and hematoma to top of head. HX of dementia     HPI Emily Dawson is a 89 y.o. female patient presents from skilled nursing facility after a witnessed fall out of bed.  Patient sustained laceration to the scalp.  No loss of consciousness.  Patient is not anticoagulated.   The history is provided by the EMS personnel.    Past Medical History Past Medical History:  Diagnosis Date   Abnormal thyroid blood test 11/04/2010   Dr.Berglind's lab results indicated TSH was low, Per records provided by patient at new patient appointment    Alzheimer disease [facility])    Per [facility] New Patient packet   Angle-closure glaucoma    Per records provided by patient at new patient appointment    Arthritis    Per [facility] New Patient packet   Breast cancer ([place])    Chemo 04/2001-07/2001, Per [facility] New Patient packet   Chronic kidney disease    Per [facility] New Patient packet   Chronic UTI    Per [facility] New Patient packet   Colon polyps    Per [facility] New Patient packet   Dementia [facility])    Per [facility] New Patient packet   Eczema    Per [facility] New Patient packet   Glaucoma    Per [facility] New Patient packet   H/O angiography 02/2019   Per [place] Patient Packet   H/O CT scan of head 01/2020   Per [place] New Patient Packet   H/O cystoscopy 03/12/2014   Dr.Kelly Venia Minks, Per [facility] New Patient packet   H/O laser iridotomy 06/2015   Per [facility] New Patient Packet   Hematuria    Per [facility] New Patient packet   Henoch-Schonlein purpura [facility])    Dr.Heher, Per [facility] New Patient packet   Hives    Per records provided by patient at new patient appointment    Hypertension    Per [facility] New Patient packet   Low TSH level     Memory loss    Over the past 6 years (as of 2021), Per [facility] New Patient packet   Osteoporosis    Per [facility] New Patient packet   Retinal vein occlusion of left eye    Per [facility] New Patient packet   Thyroid disease    Per [facility] New Patient packet   Vitamin D deficiency    Per [facility] New Patient packet   Patient Active Problem List   Diagnosis Date Noted   Left displaced femoral neck fracture ([place]) 10/13/2022   Falls, initial encounter 10/13/2022   Leukocytosis 10/13/2022   Abnormal chest x-ray 10/13/2022   Dehydration 10/13/2022   Acute urinary retention 10/13/2022   S/P total left hip arthroplasty 10/13/2022   Palliative care encounter 05/10/2022   Osteoporosis    Vitamin D deficiency    Dementia ([place])    Hypertension    CKD (chronic kidney disease) stage 3, GFR 30-59 ml/min (HCC)    Low TSH level    Home Medication(s) Prior to Admission medications   Medication Sig Start Date End Date Taking? Authorizing Provider  acetaminophen (TYLENOL) 500 MG tablet Take 2 tablets (1,000 mg total) by mouth every 8 (eight) hours as needed for moderate  pain, fever or headache. 10/21/22   Barb Merino, MD  ALPRAZolam Duanne Moron) 0.25 MG tablet Take 1 tablet (0.25 mg total) by mouth 3 (three) times daily as needed for anxiety. 10/21/22   Barb Merino, MD  amLODipine (NORVASC) 5 MG tablet Take 1 tablet (5 mg total) by mouth daily. 10/21/22   Barb Merino, MD  aspirin 81 MG chewable tablet Chew 1 tablet (81 mg total) by mouth 2 (two) times daily for 21 days. 10/21/22 11/11/22  Barb Merino, MD  b complex vitamins capsule Take 1 capsule by mouth daily.    [provider]  Carboxymeth-Glyc-Polysorb PF (REFRESH OPTIVE ADVANCED PF) 0.5-1-0.5 % SOLN Place 1 drop into both eyes at bedtime.    [provider]  docusate sodium (COLACE) 100 MG capsule Take 1 capsule (100 mg total) by mouth 2 (two) times daily. 10/15/22   Aline August, MD  dorzolamidel-timolol (COSOPT) 22.3-6.8 MG/ML SOLN ophthalmic  solution Place 1 drop into both eyes 2 (two) times daily.    [provider]  latanoprost (XALATAN) 0.005 % ophthalmic solution Place 1 drop into both eyes at bedtime.    [provider]  melatonin 3 MG TABS tablet Take 3 mg by mouth at bedtime. 10/12/22   [provider]  metoprolol succinate (TOPROL-XL) 100 MG 24 hr tablet Take 1 tablet (100 mg total) by mouth daily. TAKE WITH OR IMMEDIATELY FOLLOWING A MEAL. 09/24/21   Lauree Chandler, NP  mineral oil-hydrophilic petrolatum (AQUAPHOR) ointment Apply topically as needed for dry skin. Both legs Patient taking differently: Apply 1 Application topically as needed for dry skin. 03/16/21   Ngetich, Dinah C, NP  polyethylene glycol (MIRALAX / GLYCOLAX) 17 g packet Take 17 g by mouth daily as needed for moderate constipation. 10/15/22   Aline August, MD  QUEtiapine (SEROQUEL) 50 MG tablet Take 1 tablet (50 mg total) by mouth at bedtime. 10/21/22 11/20/22  Barb Merino, MD  senna-docusate (SENOKOT-S) 8.6-50 MG tablet Take 1 tablet by mouth at bedtime. 10/21/22 11/20/22  Barb Merino, MD  VITAMIN D, CHOLECALCIFEROL, PO Take 2,000 Units by mouth daily.    [provider]                                                                                                                                    Allergies Nsaids, Ampicillin, Arimidex [anastrozole], Azithromycin, Celecoxib, Fosamax [alendronate], Ibuprofen, Naproxen, Penicillins, Sulfa antibiotics, Tetracyclines & related, and Losartan  Review of Systems Review of Systems As noted in HPI  Physical Exam Vital Signs  I have reviewed the triage vital signs BP (!) 168/78 (BP Location: Left Arm)   Pulse 75   Temp 98.3 F (36.8 C) (Oral)   Resp 17   LMP  (LMP Unknown)   SpO2 100%   Physical Exam Constitutional:      General: She is not in acute distress.    Appearance: She is well-developed. She is  not diaphoretic.     Interventions: Cervical collar in place.   HENT:     Head: Normocephalic. Contusion and laceration present.      Right Ear: External ear normal.     Left Ear: External ear normal.     Nose: Nose normal.  Eyes:     General: No scleral icterus.       Right eye: No discharge.        Left eye: No discharge.     Conjunctiva/sclera: Conjunctivae normal.     Pupils: Pupils are equal, round, and reactive to light.  Cardiovascular:     Rate and Rhythm: Normal rate and regular rhythm.     Pulses:          Radial pulses are 2+ on the right side and 2+ on the left side.       Dorsalis pedis pulses are 2+ on the right side and 2+ on the left side.     Heart sounds: Normal heart sounds. No murmur heard.    No friction rub. No gallop.  Pulmonary:     Effort: Pulmonary effort is normal. No respiratory distress.     Breath sounds: Normal breath sounds. No stridor. No wheezing.  Abdominal:     General: There is no distension.     Palpations: Abdomen is soft.     Tenderness: There is no abdominal tenderness.  Musculoskeletal:     Cervical back: Normal range of motion and neck supple. No bony tenderness.     Thoracic back: No bony tenderness.     Lumbar back: No bony tenderness.     Right hip: Normal range of motion.     Left hip: Tenderness present. Normal range of motion.     Comments: Clavicles stable. Chest stable to AP/Lat compression. Pelvis stable to Lat compression. No obvious extremity deformity. No chest or abdominal wall contusion.  Skin:    General: Skin is warm and dry.     Findings: No erythema or rash.  Neurological:     Mental Status: She is alert and oriented to person, place, and time.     Comments: Moving all extremities     ED Results and Treatments Labs (all labs ordered are listed, but only abnormal results are displayed) Labs Reviewed - No data to display                                                                                                                       EKG  EKG  Interpretation  Date/Time:    Ventricular Rate:    PR Interval:    QRS Duration:   QT Interval:    QTC Calculation:   R Axis:     Text Interpretation:         Radiology DG HIP UNILAT W OR W/O PELVIS 2-3 VIEWS LEFT  Result Date: 11/02/2022 CLINICAL DATA:  Fall, history of total hip arthroplasty. EXAM: DG HIP (WITH OR WITHOUT PELVIS) 2-3V LEFT COMPARISON:  Hip radiograph 10/13/2022 FINDINGS: Left hip total arthroplasty. No periprosthetic lucency or fracture. Bony pelvis including pubic rami are intact. Pubic symphysis and sacroiliac joints are congruent. Right hip appears intact. Overlying artifact from clothing. Surgical clips in the pelvis. IMPRESSION: Left hip arthroplasty without complication or acute fracture. Electronically Signed   By: Keith Rake M.D.   On: 11/02/2022 23:45    Medications Ordered in ED Medications  lidocaine-EPINEPHrine (XYLOCAINE W/EPI) 2 %-1:200000 (PF) injection 20 mL (has no administration in time range)                                                                                                                                     Procedures Procedures *** (including critical care time)  Medical Decision Making / ED Course  Click here for ABCD2, HEART and other calculators  Medical Decision Making Amount and/or Complexity of Data Reviewed Radiology: ordered and independent interpretation performed. Decision-making details documented in ED Course.  Risk Prescription drug management.   Witnessed fall with head injury. Will obtain CT head and cervical spine to rule out any serious internal injuries.  On exam patient is complaining of left leg pain.  On review of records, it appears that patient recently had left hip fracture status post ORIF.  Will assess for any periprosthetic injuries.  CT head and cervical spine negative. X-ray of the pelvis and left hip negative.  Laceration irrigated and closed as above.       Final Clinical  Impression(s) / ED Diagnoses Final diagnoses:  None    {Document critical care time when appropriate:1}  {Document review of labs and clinical decision tools ie heart score, Chads2Vasc2 etc:1}  {Document your independent review of radiology images, and any outside records:1} {Document your discussion with family members, caretakers, and with consultants:1} {Document social determinants of health affecting pt's care:1} {Document your decision making why or why not admission, treatments were needed:1} This chart was dictated using voice recognition software.  Despite best efforts to proofread,  errors can occur which can change the documentation meaning.

## 2022-11-02 NOTE — Progress Notes (Signed)
[facility] Consult Note Telephone: [phone]  Fax: [phone]  PATIENT NAME: Emily Dawson [address] [name] [address] [phone] (home)  DOB: Apr 03, 1934 MRN: [id number]  PRIMARY CARE PROVIDER:    Edmonia Caprio, NP,  [place] [address] [phone]  REFERRING PROVIDER:   Edmonia Caprio, NP [place],  [place] [address] [phone]  RESPONSIBLE PARTY:   Emily Dawson - daughter [id number] Patient likes to be [place]     Name Relation Home Work Mobile   Dawson,Emily Daughter   [phone]   Dawson Emily Daughter   [place]   [phone]   Emily Dawson Spouse   [phone]       I met face to face with patient in [place]. Caregiver Emily Dawson is with patient during visit. Palliative Care was asked to follow this patient to address advance care planning, complex medical decision making and goals of care clarification. This is initial palliative consult post hospitalization. NP spoke with Emily Dawson and updated her on visit; she provided additional information as patient with dementia.   ASSESSMENT AND / RECOMMENDATIONS:   Advance Care Planning: Our advance care planning conversation included a discussion about:    The value and importance of advance care planning  Difference between Hospice and Palliative care Exploration of goals of care in the event of a sudden injury or illness  Identification and preparation of a healthcare agent  Review and updating or creation of an  advance directive document . Decision not to resuscitate or to de-escalate disease focused treatments due to poor prognosis.  CODE STATUS: DNR  Goals of Care: Goals include to maximize quality of life and symptom management. MOST selections include comfort measures, no antibiotics, no IV fluids, no feeding tube. Family is interested in hospice service when patient  qualifies for it.  I spent 25 minutes providing this initial consultation. More than 50% of the time in this consultation was spent on counseling patient and coordinating communication. --------------------------------------------------------------------------------------------------------------------------------------  Symptom Management/Plan: Left femoral neck fracture: Status post left hip arthroplasty 10/13/2022.  Patient is currently weightbearing, ambulatory with rolling walker.  Follow-up visit today with Ortho; reports healing well.  Dementia: Worsening memory loss/confusion likely related to recent hospitalization for left hip arthroplasty. Continue Namenda and ongoing supportive care. Encourage puzzles/word search, reminiscence.  Agitation related to dementia managed with quetiapine.  Anxiety: Managed with Xanax.   Weakness: worsened with recent left hip arthroplasty. PT/OT is ongoing for strengthening gait training and ambulation. Routine CBC CMP  Abnormal weight loss: 5 feet 4 inches, current weight 85 Ibs down 99 Ibs a year ago. Provide enough time and assistance during meals to ensure adequate oral intake.  Nursing reports appetite improving.  Boost BID for nutritional augmentation.  Routine CBC CMP. Follow up: Palliative care will continue to follow for complex medical decision making, advance care planning, and clarification of goals. Return 6 weeks or prn. Encouraged to call provider sooner with any concerns.   Family /Caregiver/Community Supports: Patient in [place] for ongoing care. Daughter- Emily Dawson, a med surg nurse visits often and coordinates her care. Strong family support system.   HOSPICE ELIGIBILITY/DIAGNOSIS: TBD  Chief Complaint: Initial Palliative care consult post hospitalization  HISTORY OF PRESENT ILLNESS:  Emily Dawson is a 89 y.o. year old female  with multiple morbidities requiring close monitoring and with high risk of complications and mortality: Left femoral  hip fracture status post left hip arthroplasty,  dementia, abnormal weight loss, weakness hypertension. History of breast cancer s/p double mastectomy.  Patient endorses weakness, denies pain/discomfort, sleepy during visit.  FLACC 0.  History obtained from review of EMR, discussion with primary team, caregiver, family and/or [name].  Review and summarization of Epic records shows history from other than patient. Rest of 10 point ROS asked and negative.  Independent interpretation of tests and reviewed as needed, available labs, patient records, imaging, studies and related documents from the EMR.  PAST MEDICAL HISTORY:  Active Ambulatory Problems    Diagnosis Date Noted   Osteoporosis    Vitamin D deficiency    Dementia ([facility])    Hypertension    CKD (chronic kidney disease) stage 3, GFR 30-59 ml/min (HCC)    Low TSH level    Palliative care encounter 05/10/2022   Left displaced femoral neck fracture ([facility]) 10/13/2022   Falls, initial encounter 10/13/2022   Leukocytosis 10/13/2022   Abnormal chest x-ray 10/13/2022   Dehydration 10/13/2022   Acute urinary retention 10/13/2022   S/P total left hip arthroplasty 10/13/2022   Resolved Ambulatory Problems    Diagnosis Date Noted   No Resolved Ambulatory Problems   Past Medical History:  Diagnosis Date   Abnormal thyroid blood test 11/04/2010   Alzheimer disease ([facility])    Angle-closure glaucoma    Arthritis    Breast cancer ([facility])    Chronic kidney disease    Chronic UTI    Colon polyps    Eczema    Glaucoma    H/O angiography 02/2019   H/O CT scan of head 01/2020   H/O cystoscopy 03/12/2014   H/O laser iridotomy 06/2015   Hematuria    Henoch-Schonlein purpura (HCC)    Hives    Memory loss    Retinal vein occlusion of left eye    Thyroid disease     SOCIAL HX:  Social History   Tobacco Use   Smoking status: Never   Smokeless tobacco: Never  Substance Use Topics   Alcohol use: Never     FAMILY HX:  Family History   Problem Relation Age of Onset   Cancer Mother    Heart disease Father    Hypertension Son    Hypertension Daughter       ALLERGIES:  Allergies  Allergen Reactions   Nsaids    Ampicillin    Arimidex [Anastrozole] Other (See Comments)    Kidney problems    Azithromycin    Celecoxib    Fosamax [Alendronate] Other (See Comments)    Kidney problem    Ibuprofen    Naproxen    Penicillins    Sulfa Antibiotics    Tetracyclines & Related    Losartan Rash      PERTINENT MEDICATIONS:  Outpatient Encounter Medications as of 11/02/2022  Medication Sig   acetaminophen (TYLENOL) 500 MG tablet Take 2 tablets (1,000 mg total) by mouth every 8 (eight) hours as needed for moderate pain, fever or headache.   ALPRAZolam (XANAX) 0.25 MG tablet Take 1 tablet (0.25 mg total) by mouth 3 (three) times daily as needed for anxiety.   amLODipine (NORVASC) 5 MG tablet Take 1 tablet (5 mg total) by mouth daily.   aspirin 81 MG chewable tablet Chew 1 tablet (81 mg total) by mouth 2 (two) times daily for 21 days.   b complex vitamins capsule Take 1 capsule by mouth daily.   Carboxymeth-Glyc-Polysorb PF (REFRESH OPTIVE ADVANCED PF) 0.5-1-0.5 % SOLN Place 1 drop into both eyes  at bedtime.   docusate sodium (COLACE) 100 MG capsule Take 1 capsule (100 mg total) by mouth 2 (two) times daily.   dorzolamidel-timolol (COSOPT) 22.3-6.8 MG/ML SOLN ophthalmic solution Place 1 drop into both eyes 2 (two) times daily.   latanoprost (XALATAN) 0.005 % ophthalmic solution Place 1 drop into both eyes at bedtime.   melatonin 3 MG TABS tablet Take 3 mg by mouth at bedtime.   metoprolol succinate (TOPROL-XL) 100 MG 24 hr tablet Take 1 tablet (100 mg total) by mouth daily. TAKE WITH OR IMMEDIATELY FOLLOWING A MEAL.   mineral oil-hydrophilic petrolatum (AQUAPHOR) ointment Apply topically as needed for dry skin. Both legs (Patient taking differently: Apply 1 Application topically as needed for dry skin.)   polyethylene glycol  (MIRALAX / GLYCOLAX) 17 g packet Take 17 g by mouth daily as needed for moderate constipation.   QUEtiapine (SEROQUEL) 50 MG tablet Take 1 tablet (50 mg total) by mouth at bedtime.   senna-docusate (SENOKOT-S) 8.6-50 MG tablet Take 1 tablet by mouth at bedtime.   VITAMIN D, CHOLECALCIFEROL, PO Take 2,000 Units by mouth daily.   No facility-administered encounter medications on file as of 11/02/2022.    Thank you for the opportunity to participate in the care of Ms. Hallums.  The palliative care team will continue to follow. Please call our office at [phone] if we can be of additional assistance.   Note: Portions of this note were generated with Lobbyist. Dictation errors may occur despite best attempts at proofreading.  Teodoro Spray, NP

## 2022-11-02 NOTE — ED Notes (Signed)
Fall bracelet and yellow nonskid socks applied to patient for fall risk identification/prevention.

## 2022-11-02 NOTE — ED Triage Notes (Signed)
Patient had a witnessed fall from the bed, approximately 1"laceration and hematoma to top of head. HX of dementia

## 2022-11-03 DIAGNOSIS — S0101XA Laceration without foreign body of scalp, initial encounter: Secondary | ICD-10-CM | POA: Diagnosis not present

## 2022-11-03 NOTE — ED Notes (Signed)
[facility] Called, no ETA

## 2022-11-18 ENCOUNTER — Non-Acute Institutional Stay: Payer: Medicare Other | Admitting: Hospice

## 2022-11-18 DIAGNOSIS — R451 Restlessness and agitation: Secondary | ICD-10-CM

## 2022-11-18 DIAGNOSIS — S72002A Fracture of unspecified part of neck of left femur, initial encounter for closed fracture: Secondary | ICD-10-CM

## 2022-11-18 DIAGNOSIS — R634 Abnormal weight loss: Secondary | ICD-10-CM

## 2022-11-18 DIAGNOSIS — G4700 Insomnia, unspecified: Secondary | ICD-10-CM

## 2022-11-18 DIAGNOSIS — Z515 Encounter for palliative care: Secondary | ICD-10-CM

## 2022-11-18 DIAGNOSIS — F03918 Unspecified dementia, unspecified severity, with other behavioral disturbance: Secondary | ICD-10-CM

## 2022-11-18 NOTE — Progress Notes (Signed)
[facility] Consult Note Telephone: [phone]  Fax: [phone]  PATIENT NAME: Emily Dawson [address] [place] [address] [phone] (home)  DOB: [birth year] MRN: [id number]  PRIMARY CARE PROVIDER:    Edmonia Caprio, NP,  [place] [address] [phone]  REFERRING PROVIDER:   Edmonia Caprio, NP [place],   [address] [phone]  RESPONSIBLE PARTY:   [name] - daughter [id number] Patient likes to be [place]     Name Relation Home Work Mobile   [name],[name] Daughter   [phone]   [name] Daughter   [place]   [phone]   [place] Spouse   [phone]       I met face to face with patient in AL. Caregiver is with patient during visit. Palliative Care was asked to follow this patient to address advance care planning, complex medical decision making and goals of care clarification.  NP called to see you before the visit and after the visit to update on visit.  She expressed appreciation for the update.   ASSESSMENT AND / RECOMMENDATIONS:   CODE STATUS: DNR  Goals of Care: Goals include to maximize quality of life and symptom management. MOST selections include comfort measures, no antibiotics, no IV fluids, no feeding tube. Family is interested in hospice service when patient qualifies for it.  Symptom Management/Plan: Left femoral neck fracture: Status post left hip arthroplasty 10/13/2022.  Continue PT/OT for strengthening, mobility and environmental adaptation. Patient is currently weightbearing, ambulatory. Follow-up with Ortho as planned.   Dementia: memory loss/confusion at baseline. Continue Namenda and ongoing supportive care. Encourage puzzles/word search, reminiscence.  Provide cueing as needed, assistance with ADLs.  Safety/fall precautions  Agitation related to dementia managed with  quetiapine.  Use de-escalation techniques. Insomnia: Managed with Trazodone and Melatonin.  Caregiver reports she sleptwell last night.   Anxiety: Managed with Xanax.   Abnormal weight loss: 5 feet 4 inches, current weight  101.6 Ibs 11/05/22 up from 85 Ibs 2 months ago. Provide enough time and assistance during meals to ensure adequate oral intake.  Nursing reports appetite improving.  Boost BID for nutritional augmentation.   Follow up: Palliative care will continue to follow for complex medical decision making, advance care planning, and clarification of goals. Return 6 weeks or prn. Encouraged to call provider sooner with any concerns.   Family /Caregiver/Community Supports: Patient in [place] for ongoing care. Daughter- [name], a med surg nurse visits often and coordinates her care. Strong family support system.   HOSPICE ELIGIBILITY/DIAGNOSIS: TBD  Chief Complaint: F/u visit  HISTORY OF PRESENT ILLNESS:  Emily Dawson is a 89 y.o. year old female  with multiple morbidities requiring close monitoring and with high risk of complications and mortality: Left femoral hip fracture status post left hip arthroplasty, dementia, abnormal weight loss, weakness hypertension. History of breast cancer s/p double mastectomy.  Patient endorses weakness, denies pain/discomfort, sleepy during visit.  FLACC 0.  History obtained from review of EMR, discussion with primary team, caregiver, family and/or Ms. Jaime.  Review and summarization of Epic records shows history from other than patient. Rest of 10 point ROS asked and negative.  Independent interpretation of tests and reviewed as needed, available labs, patient records, imaging, studies and related documents from the EMR.  PAST MEDICAL HISTORY:  Active Ambulatory Problems    Diagnosis Date Noted   Osteoporosis    Vitamin D deficiency  Dementia ([facility])    Hypertension    CKD (chronic kidney disease) stage 3, GFR 30-59 ml/min (HCC)    Low TSH level     Palliative care encounter 05/10/2022   Left displaced femoral neck fracture ([facility]) 10/13/2022   Falls, initial encounter 10/13/2022   Leukocytosis 10/13/2022   Abnormal chest x-ray 10/13/2022   Dehydration 10/13/2022   Acute urinary retention 10/13/2022   S/P total left hip arthroplasty 10/13/2022   Resolved Ambulatory Problems    Diagnosis Date Noted   No Resolved Ambulatory Problems   Past Medical History:  Diagnosis Date   Abnormal thyroid blood test 11/04/2010   Alzheimer disease ([facility])    Angle-closure glaucoma    Arthritis    Breast cancer ([facility])    Chronic kidney disease    Chronic UTI    Colon polyps    Eczema    Glaucoma    H/O angiography 02/2019   H/O CT scan of head 01/2020   H/O cystoscopy 03/12/2014   H/O laser iridotomy 06/2015   Hematuria    Henoch-Schonlein purpura (HCC)    Hives    Memory loss    Retinal vein occlusion of left eye    Thyroid disease     SOCIAL HX:  Social History   Tobacco Use   Smoking status: Never   Smokeless tobacco: Never  Substance Use Topics   Alcohol use: Never     FAMILY HX:  Family History  Problem Relation Age of Onset   Cancer Mother    Heart disease Father    Hypertension Son    Hypertension Daughter       ALLERGIES:  Allergies  Allergen Reactions   Nsaids    Ampicillin    Arimidex [Anastrozole] Other (See Comments)    Kidney problems    Azithromycin    Celecoxib    Fosamax [Alendronate] Other (See Comments)    Kidney problem    Ibuprofen    Naproxen    Penicillins    Sulfa Antibiotics    Tetracyclines & Related    Losartan Rash      PERTINENT MEDICATIONS:  Outpatient Encounter Medications as of 11/18/2022  Medication Sig   acetaminophen (TYLENOL) 500 MG tablet Take 2 tablets (1,000 mg total) by mouth every 8 (eight) hours as needed for moderate pain, fever or headache.   ALPRAZolam (XANAX) 0.25 MG tablet Take 1 tablet (0.25 mg total) by mouth 3 (three) times daily as needed for anxiety.    amLODipine (NORVASC) 5 MG tablet Take 1 tablet (5 mg total) by mouth daily.   b complex vitamins capsule Take 1 capsule by mouth daily.   Carboxymeth-Glyc-Polysorb PF (REFRESH OPTIVE ADVANCED PF) 0.5-1-0.5 % SOLN Place 1 drop into both eyes at bedtime.   docusate sodium (COLACE) 100 MG capsule Take 1 capsule (100 mg total) by mouth 2 (two) times daily.   dorzolamidel-timolol (COSOPT) 22.3-6.8 MG/ML SOLN ophthalmic solution Place 1 drop into both eyes 2 (two) times daily.   latanoprost (XALATAN) 0.005 % ophthalmic solution Place 1 drop into both eyes at bedtime.   melatonin 3 MG TABS tablet Take 3 mg by mouth at bedtime.   metoprolol succinate (TOPROL-XL) 100 MG 24 hr tablet Take 1 tablet (100 mg total) by mouth daily. TAKE WITH OR IMMEDIATELY FOLLOWING A MEAL.   mineral oil-hydrophilic petrolatum (AQUAPHOR) ointment Apply topically as needed for dry skin. Both legs (Patient taking differently: Apply 1 Application topically as needed for dry skin.)   polyethylene glycol (MIRALAX /  GLYCOLAX) 17 g packet Take 17 g by mouth daily as needed for moderate constipation.   QUEtiapine (SEROQUEL) 50 MG tablet Take 1 tablet (50 mg total) by mouth at bedtime.   senna-docusate (SENOKOT-S) 8.6-50 MG tablet Take 1 tablet by mouth at bedtime.   VITAMIN D, CHOLECALCIFEROL, PO Take 2,000 Units by mouth daily.   No facility-administered encounter medications on file as of 11/18/2022.   I spent 60 minutes providing this consultation; time includes spent with patient/family, chart review and documentation. More than 50% of the time in this consultation was spent on care coordination.  Thank you for the opportunity to participate in the care of Ms. Mcphillips. The palliative care team will continue to follow. Please call our office at [phone] if we can be of additional assistance.   Note: Portions of this note were generated with Lobbyist. Dictation errors may occur despite best attempts at  proofreading.  Teodoro Spray, NP

## 2022-11-30 ENCOUNTER — Inpatient Hospital Stay (HOSPITAL_COMMUNITY)
Admission: EM | Admit: 2022-11-30 | Discharge: 2022-12-07 | DRG: 522 | Disposition: A | Discharge status: Skilled Nursing Facility | Payer: Medicare Other | Source: Skilled Nursing Facility | Attending: Internal Medicine | Admitting: Internal Medicine

## 2022-11-30 ENCOUNTER — Encounter (HOSPITAL_COMMUNITY): Payer: Self-pay

## 2022-11-30 ENCOUNTER — Other Ambulatory Visit: Payer: Self-pay

## 2022-11-30 ENCOUNTER — Emergency Department (HOSPITAL_COMMUNITY): Payer: Medicare Other

## 2022-11-30 DIAGNOSIS — Z882 Allergy status to sulfonamides status: Secondary | ICD-10-CM | POA: Diagnosis not present

## 2022-11-30 DIAGNOSIS — N1831 Chronic kidney disease, stage 3a: Secondary | ICD-10-CM | POA: Diagnosis present

## 2022-11-30 DIAGNOSIS — Z8249 Family history of ischemic heart disease and other diseases of the circulatory system: Secondary | ICD-10-CM

## 2022-11-30 DIAGNOSIS — Z881 Allergy status to other antibiotic agents status: Secondary | ICD-10-CM | POA: Diagnosis not present

## 2022-11-30 DIAGNOSIS — E079 Disorder of thyroid, unspecified: Secondary | ICD-10-CM | POA: Diagnosis present

## 2022-11-30 DIAGNOSIS — W228XXA Striking against or struck by other objects, initial encounter: Secondary | ICD-10-CM | POA: Diagnosis present

## 2022-11-30 DIAGNOSIS — Z66 Do not resuscitate: Secondary | ICD-10-CM | POA: Diagnosis present

## 2022-11-30 DIAGNOSIS — I129 Hypertensive chronic kidney disease with stage 1 through stage 4 chronic kidney disease, or unspecified chronic kidney disease: Secondary | ICD-10-CM | POA: Diagnosis not present

## 2022-11-30 DIAGNOSIS — M1990 Unspecified osteoarthritis, unspecified site: Secondary | ICD-10-CM | POA: Diagnosis not present

## 2022-11-30 DIAGNOSIS — Y92128 Other place in nursing home as the place of occurrence of the external cause: Secondary | ICD-10-CM

## 2022-11-30 DIAGNOSIS — Z886 Allergy status to analgesic agent status: Secondary | ICD-10-CM | POA: Diagnosis not present

## 2022-11-30 DIAGNOSIS — M810 Age-related osteoporosis without current pathological fracture: Secondary | ICD-10-CM | POA: Diagnosis present

## 2022-11-30 DIAGNOSIS — F0390 Unspecified dementia without behavioral disturbance: Secondary | ICD-10-CM | POA: Diagnosis present

## 2022-11-30 DIAGNOSIS — G309 Alzheimer's disease, unspecified: Secondary | ICD-10-CM | POA: Diagnosis present

## 2022-11-30 DIAGNOSIS — Z853 Personal history of malignant neoplasm of breast: Secondary | ICD-10-CM | POA: Diagnosis not present

## 2022-11-30 DIAGNOSIS — F0280 Dementia in other diseases classified elsewhere without behavioral disturbance: Secondary | ICD-10-CM | POA: Diagnosis present

## 2022-11-30 DIAGNOSIS — S72001A Fracture of unspecified part of neck of right femur, initial encounter for closed fracture: Secondary | ICD-10-CM | POA: Diagnosis not present

## 2022-11-30 DIAGNOSIS — Z9221 Personal history of antineoplastic chemotherapy: Secondary | ICD-10-CM

## 2022-11-30 DIAGNOSIS — N189 Chronic kidney disease, unspecified: Secondary | ICD-10-CM | POA: Diagnosis not present

## 2022-11-30 DIAGNOSIS — R54 Age-related physical debility: Secondary | ICD-10-CM | POA: Diagnosis present

## 2022-11-30 DIAGNOSIS — Z9013 Acquired absence of bilateral breasts and nipples: Secondary | ICD-10-CM

## 2022-11-30 DIAGNOSIS — N183 Chronic kidney disease, stage 3 unspecified: Secondary | ICD-10-CM | POA: Diagnosis present

## 2022-11-30 DIAGNOSIS — E559 Vitamin D deficiency, unspecified: Secondary | ICD-10-CM | POA: Diagnosis present

## 2022-11-30 DIAGNOSIS — Z79899 Other long term (current) drug therapy: Secondary | ICD-10-CM | POA: Diagnosis not present

## 2022-11-30 DIAGNOSIS — Z888 Allergy status to other drugs, medicaments and biological substances status: Secondary | ICD-10-CM | POA: Diagnosis not present

## 2022-11-30 DIAGNOSIS — S72011A Unspecified intracapsular fracture of right femur, initial encounter for closed fracture: Secondary | ICD-10-CM | POA: Diagnosis present

## 2022-11-30 DIAGNOSIS — Z880 Allergy status to penicillin: Secondary | ICD-10-CM

## 2022-11-30 DIAGNOSIS — Z96642 Presence of left artificial hip joint: Secondary | ICD-10-CM | POA: Diagnosis present

## 2022-11-30 DIAGNOSIS — D72828 Other elevated white blood cell count: Secondary | ICD-10-CM | POA: Diagnosis present

## 2022-11-30 DIAGNOSIS — W1830XA Fall on same level, unspecified, initial encounter: Secondary | ICD-10-CM | POA: Diagnosis present

## 2022-11-30 DIAGNOSIS — Z96641 Presence of right artificial hip joint: Secondary | ICD-10-CM

## 2022-11-30 DIAGNOSIS — D62 Acute posthemorrhagic anemia: Secondary | ICD-10-CM | POA: Diagnosis not present

## 2022-11-30 DIAGNOSIS — D7589 Other specified diseases of blood and blood-forming organs: Secondary | ICD-10-CM | POA: Diagnosis not present

## 2022-11-30 DIAGNOSIS — Z90710 Acquired absence of both cervix and uterus: Secondary | ICD-10-CM

## 2022-11-30 LAB — CBC WITH DIFFERENTIAL/PLATELET
Abs Immature Granulocytes: 0.21 10*3/uL — ABNORMAL HIGH (ref 0.00–0.07)
Basophils Absolute: 0.1 10*3/uL (ref 0.0–0.1)
Basophils Relative: 0 %
Eosinophils Absolute: 0 10*3/uL (ref 0.0–0.5)
Eosinophils Relative: 0 %
HCT: 35.2 % — ABNORMAL LOW (ref 36.0–46.0)
Hemoglobin: 12 g/dL (ref 12.0–15.0)
Immature Granulocytes: 1 %
Lymphocytes Relative: 12 %
Lymphs Abs: 2.3 10*3/uL (ref 0.7–4.0)
MCH: 33.6 pg (ref 26.0–34.0)
MCHC: 34.1 g/dL (ref 30.0–36.0)
MCV: 98.6 fL (ref 80.0–100.0)
Monocytes Absolute: 2 10*3/uL — ABNORMAL HIGH (ref 0.1–1.0)
Monocytes Relative: 11 %
Neutro Abs: 13.9 10*3/uL — ABNORMAL HIGH (ref 1.7–7.7)
Neutrophils Relative %: 76 %
Platelets: 267 10*3/uL (ref 150–400)
RBC: 3.57 MIL/uL — ABNORMAL LOW (ref 3.87–5.11)
RDW: 15.3 % (ref 11.5–15.5)
WBC: 18.5 10*3/uL — ABNORMAL HIGH (ref 4.0–10.5)
nRBC: 0 % (ref 0.0–0.2)

## 2022-11-30 LAB — BASIC METABOLIC PANEL
Anion gap: 11 (ref 5–15)
BUN: 34 mg/dL — ABNORMAL HIGH (ref 8–23)
CO2: 23 mmol/L (ref 22–32)
Calcium: 9.3 mg/dL (ref 8.9–10.3)
Chloride: 109 mmol/L (ref 98–111)
Creatinine, Ser: 1.15 mg/dL — ABNORMAL HIGH (ref 0.44–1.00)
GFR, Estimated: 46 mL/min — ABNORMAL LOW (ref >60)
Glucose, Bld: 132 mg/dL — ABNORMAL HIGH (ref 70–99)
Potassium: 3.8 mmol/L (ref 3.5–5.1)
Sodium: 143 mmol/L (ref 135–145)

## 2022-11-30 MED ORDER — HYDROCODONE-ACETAMINOPHEN 5-325 MG PO TABS: 1.0000 | ORAL_TABLET | Freq: Four times a day (QID) | ORAL | Status: DC | PRN

## 2022-11-30 MED ORDER — MORPHINE SULFATE (PF) 2 MG/ML IV SOLN
0.5000 mg | INTRAVENOUS | Status: DC | PRN
  Administered 2022-12-01 (×2): 0.5 mg via INTRAVENOUS
  Filled 2022-11-30 (×2): qty 1

## 2022-11-30 MED ORDER — MORPHINE SULFATE (PF) 2 MG/ML IV SOLN: 2.0000 mg | Freq: Once | INTRAVENOUS | Status: DC | PRN

## 2022-11-30 NOTE — Progress Notes (Signed)
Pt d/w EDPA.  Will need right hip arthroplasty.  NPO tonight at [place].  Will dw our arthroplasty partners in am.  Possible surgery tomorrow.  Will need to move to [facility] for surgery.  Consult to come tomorrow.

## 2022-11-30 NOTE — ED Triage Notes (Signed)
Pt from SNF with confirmed rt hip fracture. Per staff pt "bumped her rt hip on the sink last night" Today she is unable to bear weight on rt leg. Xray at facility confirmed rt hip fracture.

## 2022-11-30 NOTE — ED Notes (Signed)
Sitters from pts facility at bedside.

## 2022-11-30 NOTE — ED Provider Notes (Signed)
[facility] Provider Note   CSN: [id number] Arrival date & time: 11/30/22  2224     History  Chief Complaint  Patient presents with   Leg Injury    Mariyana Zimmerle is a 89 y.o. female.  Patient presents to the emergency department via EMS due to her reported possible right-sided hip fracture.  Patient has dementia and lives in a memory care unit with a 24-hour sitter.  The patient reportedly bumped her right hip against a sink last night.  Today she has been unwilling/unable to bear weight on the right leg.  An x-ray at the facility confirmed a right-sided hip fracture.  There are no copies of the images/report available at this time for review.  Patient is at baseline according to facility staff.  Patient with advanced dementia unable to contribute to history.  Patient resting comfortably at this time.  Past medical history significant for dementia, hypertension, chronic kidney disease, breast cancer.  HPI     Home Medications Prior to Admission medications   Medication Sig Start Date End Date Taking? Authorizing Provider  acetaminophen (TYLENOL) 500 MG tablet Take 2 tablets (1,000 mg total) by mouth every 8 (eight) hours as needed for moderate pain, fever or headache. 10/21/22   Barb Merino, MD  ALPRAZolam Duanne Moron) 0.25 MG tablet Take 1 tablet (0.25 mg total) by mouth 3 (three) times daily as needed for anxiety. 10/21/22   Barb Merino, MD  amLODipine (NORVASC) 5 MG tablet Take 1 tablet (5 mg total) by mouth daily. 10/21/22   Barb Merino, MD  b complex vitamins capsule Take 1 capsule by mouth daily.    [provider]  Carboxymeth-Glyc-Polysorb PF (REFRESH OPTIVE ADVANCED PF) 0.5-1-0.5 % SOLN Place 1 drop into both eyes at bedtime.    [provider]  docusate sodium (COLACE) 100 MG capsule Take 1 capsule (100 mg total) by mouth 2 (two) times daily. 10/15/22   Aline August, MD  dorzolamidel-timolol (COSOPT) 22.3-6.8  MG/ML SOLN ophthalmic solution Place 1 drop into both eyes 2 (two) times daily.    [provider]  latanoprost (XALATAN) 0.005 % ophthalmic solution Place 1 drop into both eyes at bedtime.    [provider]  melatonin 3 MG TABS tablet Take 3 mg by mouth at bedtime. 10/12/22   [provider]  metoprolol succinate (TOPROL-XL) 100 MG 24 hr tablet Take 1 tablet (100 mg total) by mouth daily. TAKE WITH OR IMMEDIATELY FOLLOWING A MEAL. 09/24/21   Lauree Chandler, NP  mineral oil-hydrophilic petrolatum (AQUAPHOR) ointment Apply topically as needed for dry skin. Both legs Patient taking differently: Apply 1 Application topically as needed for dry skin. 03/16/21   Ngetich, Dinah C, NP  polyethylene glycol (MIRALAX / GLYCOLAX) 17 g packet Take 17 g by mouth daily as needed for moderate constipation. 10/15/22   Aline August, MD  QUEtiapine (SEROQUEL) 50 MG tablet Take 1 tablet (50 mg total) by mouth at bedtime. 10/21/22 11/20/22  Barb Merino, MD  VITAMIN D, CHOLECALCIFEROL, PO Take 2,000 Units by mouth daily.    [provider]      Allergies    Nsaids, Ampicillin, Arimidex [anastrozole], Azithromycin, Celecoxib, Fosamax [alendronate], Ibuprofen, Naproxen, Penicillins, Sulfa antibiotics, Tetracyclines & related, and Losartan    Review of Systems   Review of Systems  Physical Exam Updated Vital Signs BP (!) 150/69   Pulse 85   Temp 97.8 F (36.6 C) (Oral)   Resp 16  Ht 5\' 4"  (1.626 m)   Wt 45.4 kg   LMP  (LMP Unknown)   SpO2 97%   BMI 17.16 kg/m  Physical Exam Vitals and nursing note reviewed.  Constitutional:      General: She is not in acute distress.    Appearance: She is well-developed.  HENT:     Head: Normocephalic and atraumatic.  Eyes:     Conjunctiva/sclera: Conjunctivae normal.  Cardiovascular:     Rate and Rhythm: Normal rate and regular rhythm.     Heart sounds: No murmur heard. Pulmonary:     Effort: Pulmonary effort is normal. No  respiratory distress.     Breath sounds: Normal breath sounds.  Abdominal:     Palpations: Abdomen is soft.     Tenderness: There is no abdominal tenderness.  Musculoskeletal:        General: No swelling.     Cervical back: Neck supple.     Comments: Patient withdraws from pain with any movement of the right lower extremity.  Pulses and sensation intact in the distal right lower extremity  Skin:    General: Skin is warm and dry.     Capillary Refill: Capillary refill takes less than 2 seconds.  Neurological:     Mental Status: She is alert. Mental status is at baseline.  Psychiatric:        Mood and Affect: Mood normal.     ED Results / Procedures / Treatments   Labs (all labs ordered are listed, but only abnormal results are displayed) Labs Reviewed  BASIC METABOLIC PANEL - Abnormal; Notable for the following components:      Result Value   Glucose, Bld 132 (*)    BUN 34 (*)    Creatinine, Ser 1.15 (*)    GFR, Estimated 46 (*)    All other components within normal limits  CBC WITH DIFFERENTIAL/PLATELET - Abnormal; Notable for the following components:   WBC 18.5 (*)    RBC 3.57 (*)    HCT 35.2 (*)    Neutro Abs 13.9 (*)    Monocytes Absolute 2.0 (*)    Abs Immature Granulocytes 0.21 (*)    All other components within normal limits    EKG None  Radiology DG Hip Unilat W or Wo Pelvis 2-3 Views Right  Result Date: 11/30/2022 CLINICAL DATA:  Right hip pain, possible fracture. EXAM: DG HIP (WITH OR WITHOUT PELVIS) 2-3V RIGHT COMPARISON:  10/25/2022. FINDINGS: There is a displaced subcapital femoral fracture on the right with superior subluxation of the distal fracture fragment. No dislocation. Total arthroplasty changes are noted on the left without evidence of hardware loosening. The remaining bony structures are intact. Degenerative changes are noted in the lower lumbar spine. Multiple surgical clips are seen in the pelvis. IMPRESSION: 1. Displaced subcapital femoral  fracture on the right. 2. Status post total hip arthroplasty on the left without evidence of hardware loosening. Electronically Signed   By: Brett Fairy M.D.   On: 11/30/2022 22:58    Procedures Procedures    Medications Ordered in ED Medications  morphine (PF) 2 MG/ML injection 2 mg (has no administration in time range)    ED Course/ Medical Decision Making/ A&P                             Medical Decision Making Amount and/or Complexity of Data Reviewed Labs: ordered. Radiology: ordered.   Patient presents to the emergency department  with a chief complaint of possible right-sided hip fracture.  Differential includes fracture, dislocation, soft tissue injury, and others  Patient has comorbidities including advanced dementia.  I reviewed the patient's past medical history which included recent admission in February of this year for left-sided hip fracture with subsequent arthroplasty by Dr.Olin  I ordered and interpreted imaging including plain films of the right hip.  Images show a displaced subcapital femoral fracture.  I agree with the radiologist findings  Requested consultation with orthopedic surgery, Dr. Stann Mainland.  His recommendations include patient to be n.p.o. after midnight, recommend transfer to [facility] for further management including possible surgical fixation.  I requested consultation with the hospitalist. Dr.Gardner agreed to see the patient for admission  The patient is currently not acting as if she is in pain.  She was given Tylenol at her facility prior to EMS transport.  One-time as needed morphine dose ordered.  Patient with a right-sided femoral fracture which will likely need surgical intervention.  Patient will need admission to the hospital for further evaluation and management.       Final Clinical Impression(s) / ED Diagnoses Final diagnoses:  Closed subcapital fracture of right femur, initial encounter [facility])    Rx / [facility] Orders ED  Discharge Orders     None         Ronny Bacon 11/30/22 2340    Mesner, Corene Cornea, MD 12/01/22 [phone]

## 2022-12-01 ENCOUNTER — Other Ambulatory Visit: Payer: Self-pay

## 2022-12-01 ENCOUNTER — Inpatient Hospital Stay (HOSPITAL_COMMUNITY): Payer: Medicare Other

## 2022-12-01 ENCOUNTER — Inpatient Hospital Stay (HOSPITAL_COMMUNITY): Payer: Medicare Other | Admitting: Anesthesiology

## 2022-12-01 ENCOUNTER — Encounter (HOSPITAL_COMMUNITY)
Admission: EM | Disposition: A | Discharge status: Skilled Nursing Facility | Payer: Self-pay | Source: Skilled Nursing Facility | Attending: Internal Medicine

## 2022-12-01 ENCOUNTER — Encounter (HOSPITAL_COMMUNITY): Payer: Self-pay | Admitting: Internal Medicine

## 2022-12-01 DIAGNOSIS — I129 Hypertensive chronic kidney disease with stage 1 through stage 4 chronic kidney disease, or unspecified chronic kidney disease: Secondary | ICD-10-CM | POA: Diagnosis not present

## 2022-12-01 DIAGNOSIS — N1831 Chronic kidney disease, stage 3a: Secondary | ICD-10-CM | POA: Diagnosis not present

## 2022-12-01 DIAGNOSIS — F0280 Dementia in other diseases classified elsewhere without behavioral disturbance: Secondary | ICD-10-CM

## 2022-12-01 DIAGNOSIS — S72001A Fracture of unspecified part of neck of right femur, initial encounter for closed fracture: Secondary | ICD-10-CM

## 2022-12-01 DIAGNOSIS — G309 Alzheimer's disease, unspecified: Secondary | ICD-10-CM | POA: Diagnosis not present

## 2022-12-01 DIAGNOSIS — Z96641 Presence of right artificial hip joint: Secondary | ICD-10-CM

## 2022-12-01 DIAGNOSIS — M1990 Unspecified osteoarthritis, unspecified site: Secondary | ICD-10-CM

## 2022-12-01 DIAGNOSIS — N189 Chronic kidney disease, unspecified: Secondary | ICD-10-CM

## 2022-12-01 HISTORY — PX: TOTAL HIP ARTHROPLASTY: SHX124

## 2022-12-01 LAB — TYPE AND SCREEN
ABO/RH(D): A POS
Antibody Screen: NEGATIVE

## 2022-12-01 LAB — SURGICAL PCR SCREEN
MRSA, PCR: NEGATIVE
Staphylococcus aureus: NEGATIVE

## 2022-12-01 SURGERY — ARTHROPLASTY, HIP, TOTAL, ANTERIOR APPROACH
Anesthesia: General | Site: Hip | Laterality: Right

## 2022-12-01 MED ORDER — QUETIAPINE FUMARATE 50 MG PO TABS
50.0000 mg | ORAL_TABLET | Freq: Every day | ORAL | Status: DC
  Administered 2022-12-02 – 2022-12-06 (×5): 50 mg via ORAL
  Filled 2022-12-01 (×5): qty 1

## 2022-12-01 MED ORDER — FENTANYL CITRATE (PF) 100 MCG/2ML IJ SOLN
INTRAMUSCULAR | Status: AC
  Filled 2022-12-01: qty 2

## 2022-12-01 MED ORDER — LACTATED RINGERS IV SOLN: INTRAVENOUS | Status: DC

## 2022-12-01 MED ORDER — PROPOFOL 1000 MG/100ML IV EMUL
INTRAVENOUS | Status: AC
  Filled 2022-12-01: qty 100

## 2022-12-01 MED ORDER — DEXAMETHASONE SODIUM PHOSPHATE 10 MG/ML IJ SOLN
10.0000 mg | Freq: Once | INTRAMUSCULAR | Status: AC
  Administered 2022-12-02: 10 mg via INTRAVENOUS
  Filled 2022-12-01: qty 1

## 2022-12-01 MED ORDER — MELATONIN 5 MG PO TABS
5.0000 mg | ORAL_TABLET | Freq: Every day | ORAL | Status: DC
  Administered 2022-12-02 – 2022-12-06 (×5): 5 mg via ORAL
  Filled 2022-12-01 (×5): qty 1

## 2022-12-01 MED ORDER — DEXAMETHASONE SODIUM PHOSPHATE 10 MG/ML IJ SOLN
INTRAMUSCULAR | Status: AC
  Filled 2022-12-01: qty 1

## 2022-12-01 MED ORDER — ALPRAZOLAM 0.25 MG PO TABS
0.2500 mg | ORAL_TABLET | Freq: Three times a day (TID) | ORAL | Status: DC | PRN
  Administered 2022-12-04 – 2022-12-07 (×3): 0.25 mg via ORAL
  Filled 2022-12-01 (×5): qty 1

## 2022-12-01 MED ORDER — SODIUM CHLORIDE (PF) 0.9 % IJ SOLN
INTRAMUSCULAR | Status: DC | PRN
  Administered 2022-12-01: 30 mL

## 2022-12-01 MED ORDER — ROCURONIUM BROMIDE 10 MG/ML (PF) SYRINGE
PREFILLED_SYRINGE | INTRAVENOUS | Status: AC
  Filled 2022-12-01: qty 10

## 2022-12-01 MED ORDER — METHOCARBAMOL 1000 MG/10ML IJ SOLN
500.0000 mg | Freq: Four times a day (QID) | INTRAVENOUS | Status: DC | PRN
  Filled 2022-12-01: qty 5

## 2022-12-01 MED ORDER — METOPROLOL SUCCINATE ER 50 MG PO TB24
50.0000 mg | ORAL_TABLET | Freq: Two times a day (BID) | ORAL | Status: DC
  Administered 2022-12-02 – 2022-12-04 (×7): 50 mg via ORAL
  Filled 2022-12-01 (×8): qty 1

## 2022-12-01 MED ORDER — ONDANSETRON HCL 4 MG PO TABS: 4.0000 mg | ORAL_TABLET | Freq: Four times a day (QID) | ORAL | Status: DC | PRN

## 2022-12-01 MED ORDER — ONDANSETRON HCL 4 MG/2ML IJ SOLN: 4.0000 mg | Freq: Once | INTRAMUSCULAR | Status: DC | PRN

## 2022-12-01 MED ORDER — POVIDONE-IODINE 10 % EX SWAB
2.0000 | Freq: Once | CUTANEOUS | Status: AC
  Administered 2022-12-01: 2 via TOPICAL

## 2022-12-01 MED ORDER — STERILE WATER FOR IRRIGATION IR SOLN
Status: DC | PRN
  Administered 2022-12-01: 2000 mL

## 2022-12-01 MED ORDER — METOCLOPRAMIDE HCL 5 MG PO TABS: 5.0000 mg | ORAL_TABLET | Freq: Three times a day (TID) | ORAL | Status: DC | PRN

## 2022-12-01 MED ORDER — KETOROLAC TROMETHAMINE 30 MG/ML IJ SOLN
INTRAMUSCULAR | Status: AC
  Filled 2022-12-01: qty 1

## 2022-12-01 MED ORDER — PHENOL 1.4 % MT LIQD: 1.0000 | OROMUCOSAL | Status: DC | PRN

## 2022-12-01 MED ORDER — ONDANSETRON HCL 4 MG/2ML IJ SOLN
INTRAMUSCULAR | Status: AC
  Filled 2022-12-01: qty 2

## 2022-12-01 MED ORDER — ACETAMINOPHEN 10 MG/ML IV SOLN
INTRAVENOUS | Status: AC
  Filled 2022-12-01: qty 100

## 2022-12-01 MED ORDER — SODIUM CHLORIDE (PF) 0.9 % IJ SOLN
INTRAMUSCULAR | Status: AC
  Filled 2022-12-01: qty 10

## 2022-12-01 MED ORDER — PROPOFOL 10 MG/ML IV BOLUS
INTRAVENOUS | Status: DC | PRN
  Administered 2022-12-01: 125 ug/kg/min via INTRAVENOUS
  Administered 2022-12-01: 120 mg via INTRAVENOUS

## 2022-12-01 MED ORDER — ENSURE ENLIVE PO LIQD
237.0000 mL | Freq: Two times a day (BID) | ORAL | Status: DC
  Administered 2022-12-02 – 2022-12-07 (×10): 237 mL via ORAL

## 2022-12-01 MED ORDER — ROCURONIUM BROMIDE 10 MG/ML (PF) SYRINGE
PREFILLED_SYRINGE | INTRAVENOUS | Status: DC | PRN
  Administered 2022-12-01: 20 mg via INTRAVENOUS
  Administered 2022-12-01: 40 mg via INTRAVENOUS

## 2022-12-01 MED ORDER — METHOCARBAMOL 500 MG PO TABS
500.0000 mg | ORAL_TABLET | Freq: Four times a day (QID) | ORAL | Status: DC | PRN
  Administered 2022-12-04: 500 mg via ORAL
  Filled 2022-12-01: qty 1

## 2022-12-01 MED ORDER — BUPIVACAINE HCL 0.25 % IJ SOLN
INTRAMUSCULAR | Status: AC
  Filled 2022-12-01: qty 1

## 2022-12-01 MED ORDER — MORPHINE SULFATE (PF) 2 MG/ML IV SOLN
0.5000 mg | INTRAVENOUS | Status: DC | PRN
  Administered 2022-12-02 (×3): 1 mg via INTRAVENOUS
  Filled 2022-12-01 (×3): qty 1

## 2022-12-01 MED ORDER — POLYETHYLENE GLYCOL 3350 17 G PO PACK
17.0000 g | PACK | Freq: Two times a day (BID) | ORAL | Status: DC
  Administered 2022-12-02 – 2022-12-06 (×10): 17 g via ORAL
  Filled 2022-12-01 (×10): qty 1

## 2022-12-01 MED ORDER — 0.9 % SODIUM CHLORIDE (POUR BTL) OPTIME
TOPICAL | Status: DC | PRN
  Administered 2022-12-01: 1000 mL

## 2022-12-01 MED ORDER — TRAZODONE HCL 100 MG PO TABS
100.0000 mg | ORAL_TABLET | Freq: Every day | ORAL | Status: DC
  Administered 2022-12-02 – 2022-12-06 (×5): 100 mg via ORAL
  Filled 2022-12-01 (×5): qty 1

## 2022-12-01 MED ORDER — QUETIAPINE FUMARATE 25 MG PO TABS
25.0000 mg | ORAL_TABLET | Freq: Every day | ORAL | Status: DC
  Administered 2022-12-02 – 2022-12-07 (×6): 25 mg via ORAL
  Filled 2022-12-01 (×6): qty 1

## 2022-12-01 MED ORDER — FENTANYL CITRATE PF 50 MCG/ML IJ SOSY: 25.0000 ug | PREFILLED_SYRINGE | INTRAMUSCULAR | Status: DC | PRN

## 2022-12-01 MED ORDER — ONDANSETRON HCL 4 MG/2ML IJ SOLN: 4.0000 mg | Freq: Four times a day (QID) | INTRAMUSCULAR | Status: DC | PRN

## 2022-12-01 MED ORDER — CEFAZOLIN SODIUM-DEXTROSE 2-4 GM/100ML-% IV SOLN
2.0000 g | Freq: Four times a day (QID) | INTRAVENOUS | Status: AC
  Administered 2022-12-01 – 2022-12-02 (×2): 2 g via INTRAVENOUS
  Filled 2022-12-01 (×2): qty 100

## 2022-12-01 MED ORDER — SODIUM CHLORIDE 0.9 % IV SOLN: INTRAVENOUS | Status: DC

## 2022-12-01 MED ORDER — SODIUM CHLORIDE (PF) 0.9 % IJ SOLN
INTRAMUSCULAR | Status: AC
  Filled 2022-12-01: qty 50

## 2022-12-01 MED ORDER — OXYCODONE HCL 5 MG/5ML PO SOLN: 5.0000 mg | Freq: Once | ORAL | Status: DC | PRN

## 2022-12-01 MED ORDER — PROPOFOL 10 MG/ML IV BOLUS
INTRAVENOUS | Status: AC
  Filled 2022-12-01: qty 20

## 2022-12-01 MED ORDER — AMLODIPINE BESYLATE 5 MG PO TABS
5.0000 mg | ORAL_TABLET | Freq: Every day | ORAL | Status: DC
  Administered 2022-12-02 – 2022-12-07 (×6): 5 mg via ORAL
  Filled 2022-12-01 (×6): qty 1

## 2022-12-01 MED ORDER — FENTANYL CITRATE (PF) 100 MCG/2ML IJ SOLN
INTRAMUSCULAR | Status: DC | PRN
  Administered 2022-12-01 (×2): 25 ug via INTRAVENOUS
  Administered 2022-12-01: 50 ug via INTRAVENOUS

## 2022-12-01 MED ORDER — DOCUSATE SODIUM 100 MG PO CAPS
100.0000 mg | ORAL_CAPSULE | Freq: Two times a day (BID) | ORAL | Status: DC
  Administered 2022-12-02 – 2022-12-06 (×6): 100 mg via ORAL
  Filled 2022-12-01 (×9): qty 1

## 2022-12-01 MED ORDER — ACETAMINOPHEN 10 MG/ML IV SOLN
INTRAVENOUS | Status: DC | PRN
  Administered 2022-12-01: 1000 mg via INTRAVENOUS

## 2022-12-01 MED ORDER — ACETAMINOPHEN 325 MG PO TABS: 650.0000 mg | ORAL_TABLET | Freq: Once | ORAL | Status: DC

## 2022-12-01 MED ORDER — HYDROCODONE-ACETAMINOPHEN 5-325 MG PO TABS
1.0000 | ORAL_TABLET | ORAL | Status: DC | PRN
  Administered 2022-12-02 – 2022-12-04 (×5): 1 via ORAL
  Filled 2022-12-01 (×6): qty 1

## 2022-12-01 MED ORDER — DIPHENHYDRAMINE HCL 12.5 MG/5ML PO ELIX: 12.5000 mg | ORAL_SOLUTION | ORAL | Status: DC | PRN

## 2022-12-01 MED ORDER — SODIUM CHLORIDE (PF) 0.9 % IJ SOLN
INTRAMUSCULAR | Status: AC
  Filled 2022-12-01: qty 20

## 2022-12-01 MED ORDER — BISACODYL 10 MG RE SUPP: 10.0000 mg | Freq: Every day | RECTAL | Status: DC | PRN

## 2022-12-01 MED ORDER — POVIDONE-IODINE 10 % EX SWAB: 2.0000 | Freq: Once | CUTANEOUS | Status: DC

## 2022-12-01 MED ORDER — TRANEXAMIC ACID-NACL 1000-0.7 MG/100ML-% IV SOLN
1000.0000 mg | INTRAVENOUS | Status: AC
  Administered 2022-12-01: 1000 mg via INTRAVENOUS
  Filled 2022-12-01: qty 100

## 2022-12-01 MED ORDER — ASPIRIN 81 MG PO CHEW
81.0000 mg | CHEWABLE_TABLET | Freq: Two times a day (BID) | ORAL | Status: DC
  Administered 2022-12-02 – 2022-12-07 (×11): 81 mg via ORAL
  Filled 2022-12-01 (×11): qty 1

## 2022-12-01 MED ORDER — TRANEXAMIC ACID-NACL 1000-0.7 MG/100ML-% IV SOLN
1000.0000 mg | Freq: Once | INTRAVENOUS | Status: AC
  Administered 2022-12-01: 1000 mg via INTRAVENOUS
  Filled 2022-12-01: qty 100

## 2022-12-01 MED ORDER — METOCLOPRAMIDE HCL 5 MG/ML IJ SOLN: 5.0000 mg | Freq: Three times a day (TID) | INTRAMUSCULAR | Status: DC | PRN

## 2022-12-01 MED ORDER — LIDOCAINE 2% (20 MG/ML) 5 ML SYRINGE
INTRAMUSCULAR | Status: DC | PRN
  Administered 2022-12-01: 40 mg via INTRAVENOUS

## 2022-12-01 MED ORDER — SUGAMMADEX SODIUM 200 MG/2ML IV SOLN
INTRAVENOUS | Status: DC | PRN
  Administered 2022-12-01: 100 mg via INTRAVENOUS

## 2022-12-01 MED ORDER — DEXAMETHASONE SODIUM PHOSPHATE 10 MG/ML IJ SOLN
INTRAMUSCULAR | Status: DC | PRN
  Administered 2022-12-01: 8 mg via INTRAVENOUS

## 2022-12-01 MED ORDER — CEFAZOLIN SODIUM-DEXTROSE 2-4 GM/100ML-% IV SOLN
2.0000 g | INTRAVENOUS | Status: AC
  Administered 2022-12-01: 2 g via INTRAVENOUS
  Filled 2022-12-01: qty 100

## 2022-12-01 MED ORDER — OXYCODONE HCL 5 MG PO TABS: 5.0000 mg | ORAL_TABLET | Freq: Once | ORAL | Status: DC | PRN

## 2022-12-01 MED ORDER — CHLORHEXIDINE GLUCONATE 4 % EX LIQD: 60.0000 mL | Freq: Once | CUTANEOUS | Status: DC

## 2022-12-01 MED ORDER — BUPIVACAINE HCL 0.25 % IJ SOLN
INTRAMUSCULAR | Status: DC | PRN
  Administered 2022-12-01: 30 mL

## 2022-12-01 MED ORDER — KETOROLAC TROMETHAMINE 30 MG/ML IJ SOLN
INTRAMUSCULAR | Status: DC | PRN
  Administered 2022-12-01: 30 mg

## 2022-12-01 MED ORDER — ACETAMINOPHEN 325 MG PO TABS: 325.0000 mg | ORAL_TABLET | Freq: Four times a day (QID) | ORAL | Status: DC | PRN

## 2022-12-01 MED ORDER — ONDANSETRON HCL 4 MG/2ML IJ SOLN
INTRAMUSCULAR | Status: DC | PRN
  Administered 2022-12-01: 4 mg via INTRAVENOUS

## 2022-12-01 MED ORDER — LIDOCAINE HCL (PF) 2 % IJ SOLN
INTRAMUSCULAR | Status: AC
  Filled 2022-12-01: qty 5

## 2022-12-01 MED ORDER — MENTHOL 3 MG MT LOZG: 1.0000 | LOZENGE | OROMUCOSAL | Status: DC | PRN

## 2022-12-01 SURGICAL SUPPLY — 40 items
BAG COUNTER SPONGE SURGICOUNT (BAG) IMPLANT
BAG DECANTER FOR FLEXI CONT (MISCELLANEOUS) IMPLANT
BAG ZIPLOCK 12X15 (MISCELLANEOUS) IMPLANT
BLADE SAG 18X100X1.27 (BLADE) ×1 IMPLANT
COVER PERINEAL POST (MISCELLANEOUS) ×1 IMPLANT
COVER SURGICAL LIGHT HANDLE (MISCELLANEOUS) ×1 IMPLANT
CUP ACET PINNACLE SECTR 50MM (Hips) IMPLANT
DERMABOND ADVANCED .7 DNX12 (GAUZE/BANDAGES/DRESSINGS) ×1 IMPLANT
DRAPE FOOT SWITCH (DRAPES) ×1 IMPLANT
DRAPE STERI IOBAN 125X83 (DRAPES) ×1 IMPLANT
DRAPE U-SHAPE 47X51 STRL (DRAPES) ×2 IMPLANT
DRESSING AQUACEL AG SP 3.5X10 (GAUZE/BANDAGES/DRESSINGS) ×1 IMPLANT
DRSG AQUACEL AG ADV 3.5X10 (GAUZE/BANDAGES/DRESSINGS) IMPLANT
DRSG AQUACEL AG SP 3.5X10 (GAUZE/BANDAGES/DRESSINGS) ×1
DURAPREP 26ML APPLICATOR (WOUND CARE) ×1 IMPLANT
ELECT REM PT RETURN 15FT ADLT (MISCELLANEOUS) ×1 IMPLANT
GLOVE BIO SURGEON STRL SZ 6 (GLOVE) ×1 IMPLANT
GLOVE BIOGEL PI IND STRL 6.5 (GLOVE) ×1 IMPLANT
GLOVE BIOGEL PI IND STRL 7.5 (GLOVE) ×1 IMPLANT
GLOVE ORTHO TXT STRL SZ7.5 (GLOVE) ×2 IMPLANT
GOWN STRL REUS W/ TWL LRG LVL3 (GOWN DISPOSABLE) ×2 IMPLANT
GOWN STRL REUS W/TWL LRG LVL3 (GOWN DISPOSABLE) ×2
HEAD FEM STD 32X+5 STRL (Hips) IMPLANT
HOLDER FOLEY CATH W/STRAP (MISCELLANEOUS) ×1 IMPLANT
KIT TURNOVER KIT A (KITS) IMPLANT
LINER ACET PNNCL PLUS4 NEUTRAL (Hips) IMPLANT
PACK ANTERIOR HIP CUSTOM (KITS) ×1 IMPLANT
PINNACLE PLUS 4 NEUTRAL (Hips) ×1 IMPLANT
PINNACLE SECTOR CUP 50MM (Hips) ×1 IMPLANT
SCREW PINN CAN 6.5X20 (Screw) IMPLANT
STEM FEMORAL SZ5 HIGH ACTIS (Stem) IMPLANT
SUT MNCRL AB 4-0 PS2 18 (SUTURE) ×1 IMPLANT
SUT STRATAFIX 0 PDS 27 VIOLET (SUTURE) ×1
SUT VIC AB 1 CT1 36 (SUTURE) ×3 IMPLANT
SUT VIC AB 2-0 CT1 27 (SUTURE) ×2
SUT VIC AB 2-0 CT1 TAPERPNT 27 (SUTURE) ×2 IMPLANT
SUTURE STRATFX 0 PDS 27 VIOLET (SUTURE) ×1 IMPLANT
TRAY FOLEY MTR SLVR 16FR STAT (SET/KITS/TRAYS/PACK) IMPLANT
TUBE SUCTION HIGH CAP CLEAR NV (SUCTIONS) ×1 IMPLANT
WATER STERILE IRR 1000ML POUR (IV SOLUTION) ×1 IMPLANT

## 2022-12-01 NOTE — Progress Notes (Signed)
Dr. Alvan Dame aware of patient, and well known to [place].  Recently underwent left THA with [place] for hip fx. Will plan for OR today around 1 PM  NPO  Formal note to follow

## 2022-12-01 NOTE — Assessment & Plan Note (Addendum)
Stable and at baseline. -Monitor renal function -Avoid nephrotoxin

## 2022-12-01 NOTE — Discharge Instructions (Signed)

## 2022-12-01 NOTE — H&P (View-Only) (Signed)
Dr. Alvan Dame aware of patient, and well known to [place].  Recently underwent left THA with [place] for hip fx. Will plan for OR today around 1 PM  NPO  Formal note to follow

## 2022-12-01 NOTE — Care Plan (Signed)
This 89 years old female with PMH significant for Alzheimer's dementia, CKD stage IIIa, presented to s/p mechanical fall with right hip pain.  She was unable to put weight on the right leg.  X-ray at the facility confirmed right hip fracture.  Patient recently had left hip fracture and February and she underwent THA with Dr. Alvan Dame.  She has done extremely well after the surgery and was able to ambulate until last night.  She is admitted for closed right hip fracture.  Orthopedics is consulted.  Patient is scheduled to have ORIF today.  Patient was seen and examined at bedside.

## 2022-12-01 NOTE — Progress Notes (Signed)
PT Cancellation Note  Patient Details Name: Emily Dawson MRN: [id number] DOB: 31-Dec-1933   Cancelled Treatment:    Reason Eval/Treat Not Completed: Other (comment)  Pt s/p sx today for fx, will f/u later date. Abran Richard, PT Acute Rehab [facility] [phone]  Karlton Lemon 12/01/2022, 4:04 PM

## 2022-12-01 NOTE — Interval H&P Note (Signed)
History and Physical Interval Note:  12/01/2022 11:34 AM  Emily Dawson  has presented today for surgery, with the diagnosis of RIGHT FEMORAL NECK FRACTURE.  The various methods of treatment have been discussed with the patient and family. After consideration of risks, benefits and other options for treatment, the patient has consented to  Procedure(s): TOTAL HIP ARTHROPLASTY ANTERIOR APPROACH (Right) as a surgical intervention.  The patient's history has been reviewed, patient examined, no change in status, stable for surgery.  I have reviewed the patient's chart and labs.  Questions were answered to the patient's satisfaction.     Mauri Pole

## 2022-12-01 NOTE — Consult Note (Signed)
Reason for Consult:right hip fracture Referring Physician: Shelly Coss, MD  Emily Dawson is an 89 y.o. female.  HPI: Emily Dawson is a 89 y.o. female with medical history significant of Alzheimer's dz, CKD 3a.   Pt with L hip fx in Feb.  Underwent left THR by myself to manage this.  Despite advanced dementia, pt actually rehabbed quite well it sounds like, she had graduated from even needing a walker and was reportedly ambulating without need for walker per caregivers.  In memory care facility.   Reportedly bumped R hip against sink last night.  Today she has been unwilling/unable to bear weight on the right leg. An x-ray at the facility confirmed a right-sided hip fracture.  Past Medical History:  Diagnosis Date   Abnormal thyroid blood test 11/04/2010   Dr.Berglind's lab results indicated TSH was low, Per records provided by patient at new patient appointment    Alzheimer disease [facility])    Per [facility] New Patient packet   Angle-closure glaucoma    Per records provided by patient at new patient appointment    Arthritis    Per [facility] New Patient packet   Breast cancer ([place])    Chemo 04/2001-07/2001, Per [facility] New Patient packet   Chronic kidney disease    Per [facility] New Patient packet   Chronic UTI    Per [facility] New Patient packet   Colon polyps    Per [facility] New Patient packet   Dementia [facility])    Per [facility] New Patient packet   Eczema    Per [facility] New Patient packet   Glaucoma    Per [facility] New Patient packet   H/O angiography 02/2019   Per [facility] Patient Packet   H/O CT scan of head 01/2020   Per [facility] New Patient Packet   H/O cystoscopy 03/12/2014   Dr.Kelly Venia Minks, Per [facility] New Patient packet   H/O laser iridotomy 06/2015   Per [facility] New Patient Packet   Hematuria    Per [facility] New Patient packet   Henoch-Schonlein purpura [facility])    Dr.Heher, Per [facility] New Patient packet   Hives    Per records provided by patient at new patient appointment    Hypertension    Per [facility] New Patient packet   Low TSH  level    Memory loss    Over the past 6 years (as of 2021), Per [facility] New Patient packet   Osteoporosis    Per [facility] New Patient packet   Retinal vein occlusion of left eye    Per [facility] New Patient packet   Thyroid disease    Per [facility] New Patient packet   Vitamin D deficiency    Per [facility] New Patient packet    Past Surgical History:  Procedure Laterality Date   ABDOMINAL HYSTERECTOMY     Fibroid tumor. Patient with ovaries. Per [facility] New Patient packet   BIOPSY THYROID     Dr.Rahmani, Per [facility] New Patient packet   BREAST BIOPSY  02/1987   Per [facility] New Patient Packet   BREAST BIOPSY  01/2001   Per [facility] New Patient packet   COLONOSCOPY  05/17/2017   Per [facility] New Patient packet, performed by Dr. Steffanie Dunn at [facility]. Polyp was removed.   MASTECTOMY Bilateral 02/2001   Per [facility] Patient packet   MASTECTOMY MODIFIED RADICAL Bilateral 03/1987   Per [facility] New Patient packet   RENAL BIOPSY  2003   Done in [place]. Per records provided by patient  at new patient appointment    TONSILLECTOMY  1944   Per [facility] Patient packet   TOTAL HIP ARTHROPLASTY Left 10/13/2022   Procedure: LEFT TOTAL HIP ARTHROPLASTY ANTERIOR APPROACH;  Surgeon: Paralee Cancel, MD;  Location: [facility];  Service: Orthopedics;  Laterality: Left;    Family History  Problem Relation Age of Onset   Cancer Mother    Heart disease Father    Hypertension Son    Hypertension Daughter     Social History:  reports that she has never smoked. She has never used smokeless tobacco. She reports that she does not drink alcohol and does not use drugs.  Allergies:  Allergies  Allergen Reactions   Nsaids Other (See Comments)    Unknown reaction   Ampicillin Other (See Comments)    Unknown reaction    Arimidex [Anastrozole] Other (See Comments)    Kidney problems    Azithromycin Other (See Comments)    Unknown reaction    Celecoxib Other (See Comments)    Unknown reaction    Fosamax [Alendronate] Other (See  Comments)    Kidney problem    Ibuprofen Other (See Comments)    Unknown reaction    Naproxen Other (See Comments)    Unknown reaction    Penicillins Other (See Comments)    Unknown reaction    Sulfa Antibiotics Other (See Comments)    Unknown reaction     Tetracyclines & Related Other (See Comments)    Unknown reaction     Losartan Rash    Medications: I have reviewed the patient's current medications. Scheduled:  acetaminophen  650 mg Oral Once   [MAR Hold] amLODipine  5 mg Oral Daily   chlorhexidine  60 mL Topical Once   [MAR Hold] melatonin  5 mg Oral QHS   [MAR Hold] metoprolol succinate  50 mg Oral BID   povidone-iodine  2 Application Topical Once   [MAR Hold] QUEtiapine  25 mg Oral Daily   [MAR Hold] QUEtiapine  50 mg Oral QHS   [MAR Hold] traZODone  100 mg Oral QHS    Results for orders placed or performed during the hospital encounter of 11/30/22 (from the past 24 hour(s))  Basic metabolic panel     Status: Abnormal   Collection Time: 11/30/22 10:44 PM  Result Value Ref Range   Sodium 143 135 - 145 mmol/L   Potassium 3.8 3.5 - 5.1 mmol/L   Chloride 109 98 - 111 mmol/L   CO2 23 22 - 32 mmol/L   Glucose, Bld 132 (H) 70 - 99 mg/dL   BUN 34 (H) 8 - 23 mg/dL   Creatinine, Ser 1.15 (H) 0.44 - 1.00 mg/dL   Calcium 9.3 8.9 - 10.3 mg/dL   GFR, Estimated 46 (L) >60 mL/min   Anion gap 11 5 - 15  CBC with Differential     Status: Abnormal   Collection Time: 11/30/22 10:44 PM  Result Value Ref Range   WBC 18.5 (H) 4.0 - 10.5 K/uL   RBC 3.57 (L) 3.87 - 5.11 MIL/uL   Hemoglobin 12.0 12.0 - 15.0 g/dL   HCT 35.2 (L) 36.0 - 46.0 %   MCV 98.6 80.0 - 100.0 fL   MCH 33.6 26.0 - 34.0 pg   MCHC 34.1 30.0 - 36.0 g/dL   RDW 15.3 11.5 - 15.5 %   Platelets 267 150 - 400 K/uL   nRBC 0.0 0.0 - 0.2 %   Neutrophils Relative % 76 %   Neutro Abs  13.9 (H) 1.7 - 7.7 K/uL   Lymphocytes Relative 12 %   Lymphs Abs 2.3 0.7 - 4.0 K/uL   Monocytes Relative 11 %   Monocytes Absolute  2.0 (H) 0.1 - 1.0 K/uL   Eosinophils Relative 0 %   Eosinophils Absolute 0.0 0.0 - 0.5 K/uL   Basophils Relative 0 %   Basophils Absolute 0.1 0.0 - 0.1 K/uL   Immature Granulocytes 1 %   Abs Immature Granulocytes 0.21 (H) 0.00 - 0.07 K/uL  Surgical pcr screen     Status: None   Collection Time: 12/01/22  8:34 AM   Specimen: Nasal Mucosa; Nasal Swab  Result Value Ref Range   MRSA, PCR NEGATIVE NEGATIVE   Staphylococcus aureus NEGATIVE NEGATIVE  Type and screen Order type and screen if day of surgery is less than 15 days from draw of preadmission visit or order morning of surgery if day of surgery is greater than 6 days from preadmission visit.     Status: None   Collection Time: 12/01/22  8:57 AM  Result Value Ref Range   ABO/RH(D) A POS    Antibody Screen NEG    Sample Expiration      12/04/2022,2359 Performed at [facility], [place] [address] [place], [place] [address]      X-ray: CLINICAL DATA:  Right hip pain, possible fracture.   EXAM: DG HIP (WITH OR WITHOUT PELVIS) 2-3V RIGHT   COMPARISON:  10/25/2022.   FINDINGS: There is a displaced subcapital femoral fracture on the right with superior subluxation of the distal fracture fragment. No dislocation. Total arthroplasty changes are noted on the left without evidence of hardware loosening. The remaining bony structures are intact. Degenerative changes are noted in the lower lumbar spine. Multiple surgical clips are seen in the pelvis.   IMPRESSION: 1. Displaced subcapital femoral fracture on the right. 2. Status post total hip arthroplasty on the left without evidence of hardware loosening.     Electronically Signed   By: Brett Fairy M.D.  ROS: As per HPI  Blood pressure (!) 168/91, pulse 95, temperature 98.1 F (36.7 C), resp. rate 16, height 5\' 4"  (1.626 m), weight 45.4 kg, SpO2 100 %.  Physical Exam: Constitutional: NAD despite hip fx, pt more interested in sleeping right now Eyes:  PERRL, lids and conjunctivae normal ENMT: Mucous membranes are moist. Posterior pharynx clear of any exudate or lesions.Normal dentition.  Neck: normal, supple, no masses, no thyromegaly Respiratory: clear to auscultation bilaterally, no wheezing, no crackles. Normal respiratory effort. No accessory muscle use.  Cardiovascular: Regular rate and rhythm, no murmurs / rubs / gallops. No extremity edema. 2+ pedal pulses. No carotid bruits.  Abdomen: no tenderness, no masses palpated. No hepatosplenomegaly. Bowel sounds positive.  Musculoskeletal: Pain with any movement of RLE. RLE shortened and externally rotated as compared to her left Neurologic:  history of dementia at baseline    Assessment/Plan: Displaced right femoral neck fracture  Plan: Will need operative repair likely right total hip replacement similar to her left NPO Consent ordered  Plan reviewed with her daughter  Mauri Pole 12/01/2022, 1:02 PM

## 2022-12-01 NOTE — Anesthesia Postprocedure Evaluation (Signed)
Anesthesia Post Note  Patient: Emily Dawson  Procedure(s) Performed: TOTAL HIP ARTHROPLASTY ANTERIOR APPROACH (Right: Hip)     Patient location during evaluation: PACU Anesthesia Type: General Level of consciousness: confused (baseline) Pain management: pain level controlled Vital Signs Assessment: post-procedure vital signs reviewed and stable Respiratory status: spontaneous breathing, nonlabored ventilation, respiratory function stable and patient connected to nasal cannula oxygen Cardiovascular status: blood pressure returned to baseline and stable Postop Assessment: no apparent nausea or vomiting Anesthetic complications: no   No notable events documented.  Last Vitals:  Vitals:   12/01/22 1400 12/01/22 1412  BP: (!) 152/67 (!) 146/73  Pulse: 71 71  Resp: 11   Temp: 36.4 C 36.6 C  SpO2: 99% 100%    Last Pain:  Vitals:   12/01/22 1412  TempSrc: Oral  PainSc: [place]

## 2022-12-01 NOTE — H&P (Signed)
History and Physical    Patient: Emily Dawson [id number] DOB: March 03, 1934 DOA: 11/30/2022 DOS: the patient was seen and examined on 12/01/2022 PCP: Edmonia Caprio, NP  Patient coming from: SNF  Chief Complaint:  Chief Complaint  Patient presents with   Leg Injury   HPI: Emily Dawson is a 89 y.o. female with medical history significant of Alzheimer's dz, CKD 3a.  Pt with L hip fx in Feb.  Underwent THA with Dr. Alvan Dame.  Despite advanced dementia, pt actually rehabbed quite well it sounds like, she had graduated from even needing a walker and was reportedly ambulating without need for walker per caregivers.  In memory care facility.  Reportedly bumped R hip against sink last night.  Today she has been unwilling/unable to bear weight on the right leg. An x-ray at the facility confirmed a right-sided hip fracture.   Review of Systems: As mentioned in the history of present illness. All other systems reviewed and are negative. Past Medical History:  Diagnosis Date   Abnormal thyroid blood test 11/04/2010   Dr.Berglind's lab results indicated TSH was low, Per records provided by patient at new patient appointment    Alzheimer disease [facility])    Per [facility] New Patient packet   Angle-closure glaucoma    Per records provided by patient at new patient appointment    Arthritis    Per [facility] New Patient packet   Breast cancer ([place])    Chemo 04/2001-07/2001, Per [facility] New Patient packet   Chronic kidney disease    Per [facility] New Patient packet   Chronic UTI    Per [facility] New Patient packet   Colon polyps    Per [facility] New Patient packet   Dementia [facility])    Per [facility] New Patient packet   Eczema    Per [facility] New Patient packet   Glaucoma    Per [facility] New Patient packet   H/O angiography 02/2019   Per [facility] Patient Packet   H/O CT scan of head 01/2020   Per [place] New Patient Packet   H/O cystoscopy 03/12/2014   Dr.Kelly Venia Minks, Per [facility] New Patient packet   H/O laser iridotomy 06/2015   Per [facility]  New Patient Packet   Hematuria    Per [facility] New Patient packet   Henoch-Schonlein purpura [facility])    Dr.Heher, Per [facility] New Patient packet   Hives    Per records provided by patient at new patient appointment    Hypertension    Per [facility] New Patient packet   Low TSH level    Memory loss    Over the past 6 years (as of 2021), Per [facility] New Patient packet   Osteoporosis    Per [facility] New Patient packet   Retinal vein occlusion of left eye    Per [facility] New Patient packet   Thyroid disease    Per [facility] New Patient packet   Vitamin D deficiency    Per [facility] New Patient packet   Past Surgical History:  Procedure Laterality Date   ABDOMINAL HYSTERECTOMY     Fibroid tumor. Patient with ovaries. Per [facility] New Patient packet   BIOPSY THYROID     Dr.Rahmani, Per [facility] New Patient packet   BREAST BIOPSY  02/1987   Per [facility] New Patient Packet   BREAST BIOPSY  01/2001   Per [place] New Patient packet   COLONOSCOPY  05/17/2017   Per [facility] New Patient packet, performed by Dr. Steffanie Dunn at [facility] Polyp  was removed.   MASTECTOMY Bilateral 02/2001   Per [place] Patient packet   MASTECTOMY MODIFIED RADICAL Bilateral 03/1987   Per [facility] New Patient packet   RENAL BIOPSY  2003   Done in [place]. Per records provided by patient at new patient appointment    TONSILLECTOMY  1944   Per [place] Patient packet   TOTAL HIP ARTHROPLASTY Left 10/13/2022   Procedure: LEFT TOTAL HIP ARTHROPLASTY ANTERIOR APPROACH;  Surgeon: Paralee Cancel, MD;  Location: [place];  Service: Orthopedics;  Laterality: Left;   Social History:  reports that she has never smoked. She has never used smokeless tobacco. She reports that she does not drink alcohol and does not use drugs.  Allergies  Allergen Reactions   Nsaids Other (See Comments)    Unknown reaction   Ampicillin Other (See Comments)    Unknown reaction    Arimidex [Anastrozole] Other (See Comments)    Kidney problems    Azithromycin Other (See Comments)     Unknown reaction    Celecoxib Other (See Comments)    Unknown reaction    Fosamax [Alendronate] Other (See Comments)    Kidney problem    Ibuprofen Other (See Comments)    Unknown reaction    Naproxen Other (See Comments)    Unknown reaction    Penicillins Other (See Comments)    Unknown reaction    Sulfa Antibiotics Other (See Comments)    Unknown reaction     Tetracyclines & Related Other (See Comments)    Unknown reaction     Losartan Rash    Family History  Problem Relation Age of Onset   Cancer Mother    Heart disease Father    Hypertension Son    Hypertension Daughter     Prior to Admission medications   Medication Sig Start Date End Date Taking? Authorizing Provider  acetaminophen (TYLENOL) 500 MG tablet Take 2 tablets (1,000 mg total) by mouth every 8 (eight) hours as needed for moderate pain, fever or headache. Patient taking differently: Take 1,000 mg by mouth in the morning and at bedtime. May also give 2 tablets by mouth every 8 hours for moderate pain, fever or headache 10/21/22  Yes Barb Merino, MD  ALPRAZolam Duanne Moron) 0.25 MG tablet Take 1 tablet (0.25 mg total) by mouth 3 (three) times daily as needed for anxiety. 10/21/22  Yes Barb Merino, MD  amLODipine (NORVASC) 5 MG tablet Take 1 tablet (5 mg total) by mouth daily. 10/21/22  Yes Barb Merino, MD  b complex vitamins capsule Take 1 capsule by mouth daily.   Yes [provider]  melatonin 5 MG TABS Take 5 mg by mouth at bedtime. 10/12/22  Yes [provider]  metoprolol succinate (TOPROL-XL) 100 MG 24 hr tablet Take 1 tablet (100 mg total) by mouth daily. TAKE WITH OR IMMEDIATELY FOLLOWING A MEAL. Patient taking differently: Take 50 mg by mouth in the morning and at bedtime. TAKE WITH OR IMMEDIATELY FOLLOWING A MEAL. 09/24/21  Yes Lauree Chandler, NP  mineral oil-hydrophilic petrolatum (AQUAPHOR) ointment Apply topically as needed for dry skin. Both legs Patient taking differently:  Apply 1 Application topically as needed for dry skin. 03/16/21  Yes Ngetich, Dinah C, NP  polyethylene glycol (MIRALAX / GLYCOLAX) 17 g packet Take 17 g by mouth daily as needed for moderate constipation. 10/15/22  Yes Aline August, MD  QUEtiapine (SEROQUEL) 50 MG tablet Take 1 tablet (50 mg total) by mouth at bedtime. Patient taking differently: Take  25-50 mg by mouth See admin instructions. Take 1/2 tablet by mouth in the morning and 1 tablet at bedtime 10/21/22 12/01/23 Yes Ghimire, Dante Gang, MD  traZODone (DESYREL) 100 MG tablet Take 100 mg by mouth at bedtime.   Yes [provider]  triamcinolone ointment (KENALOG) 0.1 % Apply 1 Application topically 2 (two) times daily. Apply small amount to upper arms - may also apply to affected areas of both upper legs as needed 11/10/22  Yes [provider]  VITAMIN D, CHOLECALCIFEROL, PO Take 2,000 Units by mouth daily.   Yes [provider]    Physical Exam: Vitals:   11/30/22 2234 11/30/22 2235 11/30/22 2245  BP: (!) 161/77  (!) 150/69  Pulse: 83  85  Resp:   16  Temp: 97.8 F (36.6 C)    TempSrc: Oral    SpO2: 100%  97%  Weight:  45.4 kg   Height:  5\' 4"  (1.626 m)    Constitutional: NAD despite hip fx, pt more interested in sleeping right now Eyes: PERRL, lids and conjunctivae normal ENMT: Mucous membranes are moist. Posterior pharynx clear of any exudate or lesions.Normal dentition.  Neck: normal, supple, no masses, no thyromegaly Respiratory: clear to auscultation bilaterally, no wheezing, no crackles. Normal respiratory effort. No accessory muscle use.  Cardiovascular: Regular rate and rhythm, no murmurs / rubs / gallops. No extremity edema. 2+ pedal pulses. No carotid bruits.  Abdomen: no tenderness, no masses palpated. No hepatosplenomegaly. Bowel sounds positive.  Musculoskeletal: Pain with any movement of RLE. Neurologic: MAE  Data Reviewed:     X ray confirms R hip femoral neck fx.  Assessment and  Plan: * Closed right hip fracture, initial encounter ([place]) R hip fx, h/o L hip fx in Feb, repaired by Dr. Alvan Dame. Did well post repair, had rehabbed, was ambulating without need for walker. Hip fx pathway Transfer to [facility] per ortho surg request, Dr. Alvan Dame is operating over there tomorrow it seems. Pain control including IV narcotics if needed per pathway NPO CXR and EKG pending for pre-op eval.  CKD (chronic kidney disease) stage 3, GFR 30-59 ml/min (HCC) Creat of 1.1 today appears to be about her baseline.  Dementia ([place]) Pt with advanced alzheimer's at baseline. Cont seroquel, PRN xanax     Advance Care Planning:   Code Status: DNR, MOST form in chart is reviewed as is recent pal care visit.  Consults: Ortho surg, Dr. Stann Mainland  Family Communication: Daughter at bedside  Severity of Illness: The appropriate patient status for this patient is INPATIENT. Inpatient status is judged to be reasonable and necessary in order to provide the required intensity of service to ensure the patient's safety. The patient's presenting symptoms, physical exam findings, and initial radiographic and laboratory data in the context of their chronic comorbidities is felt to place them at high risk for further clinical deterioration. Furthermore, it is not anticipated that the patient will be medically stable for discharge from the hospital within 2 midnights of admission.   * I certify that at the point of admission it is my clinical judgment that the patient will require inpatient hospital care spanning beyond 2 midnights from the point of admission due to high intensity of service, high risk for further deterioration and high frequency of surveillance required.*  Author: Etta Quill., DO 12/01/2022 1:16 AM  For on call review [email or web address].

## 2022-12-01 NOTE — Transfer of Care (Signed)
Immediate Anesthesia Transfer of Care Note  Patient: Emily Dawson  Procedure(s) Performed: TOTAL HIP ARTHROPLASTY ANTERIOR APPROACH (Right: Hip)  Patient Location: PACU  Anesthesia Type:General  Level of Consciousness: awake, alert , oriented, and patient cooperative  Airway & Oxygen Therapy: Patient Spontanous Breathing and Patient connected to face mask oxygen  Post-op Assessment: Report given to RN, Post -op Vital signs reviewed and stable, and Patient moving all extremities  Post vital signs: Reviewed and stable  Last Vitals:  Vitals Value Taken Time  BP 169/60 12/01/22 1315  Temp    Pulse 87 12/01/22 1318  Resp 15 12/01/22 1318  SpO2 100 % 12/01/22 1318  Vitals shown include unvalidated device data.  Last Pain:  Vitals:   12/01/22 0718  TempSrc:   PainSc: Asleep      Patients Stated Pain Goal: 0 ([id number])  Complications: No notable events documented.

## 2022-12-01 NOTE — Progress Notes (Signed)
Initial Nutrition Assessment  DOCUMENTATION CODES:   Underweight  INTERVENTION:  - Advance diet to Regular as medically appropriate. - Ensure Plus High Protein po BID once diet advanced, each supplement provides 350 kcal and 20 grams of protein. - Encourage intake at all meals.  - Multivitamin with minerals daily - Monitor weight trends.   NUTRITION DIAGNOSIS:   Increased nutrient needs related to acute illness (R hip fracture) as evidenced by estimated needs.  GOAL:   Patient will meet greater than or equal to 90% of their needs  MONITOR:   PO intake, Supplement acceptance, Diet advancement, Weight trends  REASON FOR ASSESSMENT:   Consult Hip fracture protocol  ASSESSMENT:   89 y.o. female with PMH of Alzheimer's dz, CKD 3a who reportedly bumped R hip against sink and found to have right-sided hip fracture.  Patient being taken to OR at time of visit this AM. Unable to obtain nutrition history.   Per chart review, possible weight gain over the past 1 month. Prior to that weight stable the past year.   No intake documented this admission. Patient NPO for OR today. BMI at 17.16. Will need to monitor for malnutrition. Will attempt NFPE as able.     Medications reviewed and include: -  Labs reviewed:  Creatinine 1.15  NUTRITION - FOCUSED PHYSICAL EXAM:  Unable to perform, patient out of room   Diet Order:   Diet Order             Diet NPO time specified  Diet effective ____           Diet NPO time specified Except for: Sips with Meds  Diet effective midnight           Diet NPO time specified  Diet effective now                   EDUCATION NEEDS:  Not appropriate for education at this time  Skin:  Skin Assessment: Reviewed RN Assessment  Last BM:  PTA  Height:  Ht Readings from Last 1 Encounters:  11/30/22 5\' 4"  (1.626 m)   Weight:  Wt Readings from Last 1 Encounters:  11/30/22 45.4 kg    BMI:  Body mass index is 17.16  kg/m.  Estimated Nutritional Needs:  Kcal:  [id number] kcals Protein:  70-90 grams Fluid:  >/= 1.6L   Samson Frederic RD, LDN For contact information, refer to [facility].

## 2022-12-01 NOTE — Anesthesia Procedure Notes (Signed)
Procedure Name: Intubation Date/Time: 12/01/2022 11:54 AM  Performed by: Audry Pili, MDPre-anesthesia Checklist: Patient identified, Emergency Drugs available, Suction available and Patient being monitored Patient Re-evaluated:Patient Re-evaluated prior to induction Oxygen Delivery Method: Circle system utilized Preoxygenation: Pre-oxygenation with 100% oxygen Induction Type: IV induction Ventilation: Mask ventilation without difficulty Laryngoscope Size: Miller, 2 and Glidescope Grade View: Grade III Tube type: Oral Tube size: 7.0 mm Number of attempts: 2 Airway Equipment and Method: Video-laryngoscopy and Rigid stylet Placement Confirmation: ETT inserted through vocal cords under direct vision, positive ETCO2 and breath sounds checked- equal and bilateral Secured at: 21 cm Tube secured with: Tape Dental Injury: Teeth and Oropharynx as per pre-operative assessment  Difficulty Due To: Difficult Airway- due to anterior larynx Comments: First look with Miller 2, grade 3 view. Second look with Glidescope 3, grade 1 view.

## 2022-12-01 NOTE — Anesthesia Preprocedure Evaluation (Addendum)
Anesthesia Evaluation  Patient identified by MRN, date of birth, ID band Patient confused    Reviewed: Allergy & Precautions, NPO status , Patient's Chart, lab work & pertinent test results, reviewed documented beta blocker date and time   History of Anesthesia Complications Negative for: history of anesthetic complications  Airway Mallampati: Unable to assess  TM Distance: >3 FB     Dental  (+) Dental Advisory Given   Pulmonary neg pulmonary ROS   Pulmonary exam normal        Cardiovascular hypertension, Pt. on medications and Pt. on home beta blockers Normal cardiovascular exam     Neuro/Psych  PSYCHIATRIC DISORDERS     Dementia negative neurological ROS     GI/Hepatic negative GI ROS, Neg liver ROS,,,  Endo/Other  negative endocrine ROS    Renal/GU CRFRenal disease     Musculoskeletal  (+) Arthritis ,    Abdominal   Peds  Hematology  Henoch-Schonlein purpura    Anesthesia Other Findings   Reproductive/Obstetrics  Breast cancer                              Anesthesia Physical Anesthesia Plan  ASA: 3  Anesthesia Plan: General   Post-op Pain Management: Tylenol PO (pre-op)*   Induction: Intravenous  PONV Risk Score and Plan: 3 and Treatment may vary due to age or medical condition, Ondansetron and TIVA  Airway Management Planned: Oral ETT  Additional Equipment: None  Intra-op Plan:   Post-operative Plan: Extubation in OR  Informed Consent: I have reviewed the patients History and Physical, chart, labs and discussed the procedure including the risks, benefits and alternatives for the proposed anesthesia with the patient or authorized representative who has indicated his/her understanding and acceptance.   Patient has DNR.  Discussed DNR with power of attorney.   Dental advisory given and Consent reviewed with POA  Plan Discussed with: CRNA and  Anesthesiologist  Anesthesia Plan Comments: (Per POA, ok for intubation as needed for the procedure. However, no chest compressions under any circumstance. )       Anesthesia Quick Evaluation

## 2022-12-01 NOTE — ED Notes (Signed)
Unable to obtain signature for transport from pt due to advanced dementia.

## 2022-12-01 NOTE — Assessment & Plan Note (Signed)
Pt with advanced alzheimer's at baseline. Cont seroquel, PRN xanax

## 2022-12-01 NOTE — ED Notes (Signed)
[facility] called for transport to [place] 

## 2022-12-01 NOTE — Op Note (Signed)
NAME:  Emily Dawson                ACCOUNT NO.: [id number]      MEDICAL RECORD NO.: [id number]      FACILITY:  [facility]      PHYSICIAN:  Mauri Pole  DATE OF BIRTH:  Dec 06, 1933     DATE OF PROCEDURE:  12/01/2022                                 OPERATIVE REPORT         PREOPERATIVE DIAGNOSIS: Right hip displaced femoral neck fracture.      POSTOPERATIVE DIAGNOSIS:  Right hip displaced femoral neck fracture.      PROCEDURE:  Right total hip replacement through an anterior approach   utilizing DePuy THR system, component size 50 mm pinnacle cup, a size 32+4 neutral   Altrex liner, a size 5Hi Actis stem with a 32+5 Articuleze metal head ball.      SURGEON:  Pietro Cassis. Alvan Dame, M.D.      ASSISTANT:  Costella Hatcher, PA-C     ANESTHESIA:  General.      SPECIMENS:  None.      COMPLICATIONS:  None.      BLOOD LOSS:  275 cc     DRAINS:  None.      INDICATION OF THE PROCEDURE:  Emily Dawson is a 89 y.o. female who currently resides at [facility] facility.  She had a ground level fall resulting in right hip pain.  She was brought to the ER where X-rays revealed a displaced right femoral neck fracture.  She has unfortunately had a recent fall resulting in a left femoral neck fracture and is status post left total hip replacement to manage this.  She had been doing fine until this event yesterday. Specific risks of infection, DVT, component   failure, dislocation, neurovascular injury, and need for revision surgery were reviewed with family, consent was obtained to manage this right hip fracture.     PROCEDURE IN DETAIL:  The patient was brought to operative theater.   Once adequate anesthesia, preoperative antibiotics, 2 gm of Ancef, 1 gm of Tranexamic Acid, and 10 mg of Decadron were administered, the patient was positioned supine on the [employer] table.  Once the patient was safely positioned with adequate padding of boney prominences we predraped out  the hip, and used fluoroscopy to confirm orientation of the pelvis.      The right hip was then prepped and draped from proximal iliac crest to   mid thigh with a shower curtain technique.      Time-out was performed identifying the patient, planned procedure, and the appropriate extremity.     An incision was then made 2 cm lateral to the   anterior superior iliac spine extending over the orientation of the   tensor fascia lata muscle and sharp dissection was carried down to the   fascia of the muscle.      The fascia was then incised.  The muscle belly was identified and swept   laterally and retractor placed along the superior neck.  Following   cauterization of the circumflex vessels and removing some pericapsular   fat, a second cobra retractor was placed on the inferior neck.  A T-capsulotomy was made along the line of the   superior neck to the trochanteric fossa, then extended proximally  and   distally.  Tag sutures were placed and the retractors were then placed   intracapsular.  We then identified the trochanteric fossa and   orientation of my neck cut and then made a neck osteotomy with the femur on traction.  The femoral   head was removed without difficulty or complication.  Traction was let   off and retractors were placed posterior and anterior around the   acetabulum.      The labrum and foveal tissue were debrided.  I began reaming with a 45 mm   reamer and reamed up to 49 mm reamer with good bony bed preparation and a 50 mm  cup was chosen.  The final 50 mm Pinnacle cup was then impacted under fluoroscopy to confirm the depth of penetration and orientation with respect to   Abduction and forward flexion.  A screw was placed into the ilium followed by the hole eliminator.  The final   32+4 neutral Altrex liner was impacted with good visualized rim fit.  The cup was positioned anatomically within the acetabular portion of the pelvis.      At this point, the femur was  rolled to 100 degrees.  Further capsule was   released off the inferior aspect of the femoral neck.  I then   released the superior capsule proximally.  With the leg in a neutral position the hook was placed laterally   along the femur under the vastus lateralis origin and elevated manually and then held in position using the hook attachment on the bed.  The leg was then extended and adducted with the leg rolled to 100   degrees of external rotation.  Retractors were placed along the medial calcar and posteriorly over the greater trochanter.  Once the proximal femur was fully   exposed, I used a box osteotome to set orientation.  I then began   broaching with the starting chili pepper broach and passed this by hand and then broached up to 5.  With the 5 broach in place I chose a high offset neck and did several trial reductions.  The offset was appropriate, leg lengths   appeared to be equal best matched with the +5 head ball trial confirmed radiographically.   Given these findings, I went ahead and dislocated the hip, repositioned all   retractors and positioned the right hip in the extended and abducted position.  The final 5 Hi Actis stem was   chosen and it was impacted down to the level of neck cut.  Based on this   and the trial reductions, a final 32+5 Articuleze metal head ball was chosen and   impacted onto a clean and dry trunnion, and the hip was reduced.  The   hip had been irrigated throughout the case again at this point.  I did   reapproximate the superior capsular leaflet to the anterior leaflet   using #1 Vicryl.  The fascia of the   tensor fascia lata muscle was then reapproximated using #1 Vicryl and #0 Stratafix sutures.  The   remaining wound was closed with 2-0 Vicryl and running 4-0 Monocryl.   The hip was cleaned, dried, and dressed sterilely using Dermabond and   Aquacel dressing.  The patient was then brought   to recovery room in stable condition tolerating the  procedure well.    Costella Hatcher, PA-C was present for the entirety of the case involved from   preoperative positioning, perioperative retractor management, general  facilitation of the case, as well as primary wound closure as assistant.            Pietro Cassis Alvan Dame, M.D.        12/01/2022 12:54 PM

## 2022-12-01 NOTE — Plan of Care (Signed)
  Problem: Education: Goal: Knowledge of General Education information will improve Description: Including pain rating scale, medication(s)/side effects and non-pharmacologic comfort measures Outcome: Progressing   Problem: Pain Managment: Goal: General experience of comfort will improve Outcome: Progressing   Problem: Safety: Goal: Ability to remain free from injury will improve Outcome: Progressing   

## 2022-12-01 NOTE — Assessment & Plan Note (Addendum)
R hip fx, h/o L hip fx in Feb, repaired by Dr. Alvan Dame. Underwent total hip arthroplasty on 12/01/2022 Did well post repair, had rehabbed, was ambulating without need for walker. PT are recommending SNF -TOC consult -Continue with pain management and supportive care

## 2022-12-02 ENCOUNTER — Encounter (HOSPITAL_COMMUNITY): Payer: Self-pay | Admitting: Orthopedic Surgery

## 2022-12-02 DIAGNOSIS — N1831 Chronic kidney disease, stage 3a: Secondary | ICD-10-CM | POA: Diagnosis not present

## 2022-12-02 DIAGNOSIS — S72001A Fracture of unspecified part of neck of right femur, initial encounter for closed fracture: Secondary | ICD-10-CM | POA: Diagnosis not present

## 2022-12-02 DIAGNOSIS — F0280 Dementia in other diseases classified elsewhere without behavioral disturbance: Secondary | ICD-10-CM | POA: Diagnosis not present

## 2022-12-02 DIAGNOSIS — G309 Alzheimer's disease, unspecified: Secondary | ICD-10-CM | POA: Diagnosis not present

## 2022-12-02 LAB — BASIC METABOLIC PANEL
Anion gap: 7 (ref 5–15)
BUN: 35 mg/dL — ABNORMAL HIGH (ref 8–23)
CO2: 24 mmol/L (ref 22–32)
Calcium: 8.1 mg/dL — ABNORMAL LOW (ref 8.9–10.3)
Chloride: 110 mmol/L (ref 98–111)
Creatinine, Ser: 0.83 mg/dL (ref 0.44–1.00)
GFR, Estimated: >60 mL/min (ref >60)
Glucose, Bld: 139 mg/dL — ABNORMAL HIGH (ref 70–99)
Potassium: 3.5 mmol/L (ref 3.5–5.1)
Sodium: 141 mmol/L (ref 135–145)

## 2022-12-02 LAB — CBC
HCT: 28.5 % — ABNORMAL LOW (ref 36.0–46.0)
Hemoglobin: 9.1 g/dL — ABNORMAL LOW (ref 12.0–15.0)
MCH: 32.7 pg (ref 26.0–34.0)
MCHC: 31.9 g/dL (ref 30.0–36.0)
MCV: 102.5 fL — ABNORMAL HIGH (ref 80.0–100.0)
Platelets: 209 10*3/uL (ref 150–400)
RBC: 2.78 MIL/uL — ABNORMAL LOW (ref 3.87–5.11)
RDW: 15.6 % — ABNORMAL HIGH (ref 11.5–15.5)
WBC: 16.1 10*3/uL — ABNORMAL HIGH (ref 4.0–10.5)
nRBC: 0 % (ref 0.0–0.2)

## 2022-12-02 MED ORDER — FE FUM-VIT C-VIT B12-FA 460-60-0.01-1 MG PO CAPS
1.0000 | ORAL_CAPSULE | Freq: Every day | ORAL | Status: DC
  Filled 2022-12-02 (×2): qty 1

## 2022-12-02 NOTE — Progress Notes (Signed)
Morning medications crushed and placed in apple sauce for patient safety. Patient has trouble following directions to swallow and spits out some of the medication. It is difficult to determine how much medication patient has gotten. Dr. Reesa Chew notified. Ivan Anchors, RN 12/02/22 8:18 AM

## 2022-12-02 NOTE — Progress Notes (Signed)
Patient ID: Emily Dawson, female   DOB: 05-28-1934, 89 y.o.   MRN: [id number] Subjective: 1 Day Post-Op Procedure(s) (LRB): TOTAL HIP ARTHROPLASTY ANTERIOR APPROACH (Right)    Patient resting at this point however care giver states that she had become a bit restless and thus pain meds were given that helped to calm her down  Objective:   VITALS:   Vitals:   12/02/22 0116 12/02/22 0544  BP: (!) 152/77 (!) 160/81  Pulse: 95 94  Resp:  16  Temp:  98.2 F (36.8 C)  SpO2:  100%    Neurovascular intact Incision: dressing C/D/I  LABS Recent Labs    11/30/22 2244 12/02/22 0317  HGB 12.0 9.1*  HCT 35.2* 28.5*  WBC 18.5* 16.1*  PLT 267 209    Recent Labs    11/30/22 2244 12/02/22 0317  NA 143 141  K 3.8 3.5  BUN 34* 35*  CREATININE 1.15* 0.83  GLUCOSE 132* 139*    No results for input(s): "LABPT", "INR" in the last 72 hours.   Assessment/Plan: 1 Day Post-Op Procedure(s) (LRB): TOTAL HIP ARTHROPLASTY ANTERIOR APPROACH (Right)   Advance diet Up with therapy as much as possible Disposition dependent on progress ASA for DVT prophylaxis  RTC in 2 weeks

## 2022-12-02 NOTE — Evaluation (Signed)
Clinical/Bedside Swallow Evaluation Patient Details  Name: Emily Dawson MRN: [id number] Date of Birth: October 31, 1933  Today's Date: 12/02/2022 Time: SLP Start Time (ACUTE ONLY): 1628 SLP Stop Time (ACUTE ONLY): 1646 SLP Time Calculation (min) (ACUTE ONLY): 18 min  Past Medical History:  Past Medical History:  Diagnosis Date   Abnormal thyroid blood test 11/04/2010   Dr.Berglind's lab results indicated TSH was low, Per records provided by patient at new patient appointment    Alzheimer disease [facility])    Per [facility] New Patient packet   Angle-closure glaucoma    Per records provided by patient at new patient appointment    Arthritis    Per [facility] New Patient packet   Breast cancer ([place])    Chemo 04/2001-07/2001, Per [facility] New Patient packet   Chronic kidney disease    Per [facility] New Patient packet   Chronic UTI    Per [facility] New Patient packet   Colon polyps    Per [facility] New Patient packet   Dementia [facility])    Per [facility] New Patient packet   Eczema    Per [facility] New Patient packet   Glaucoma    Per [facility] New Patient packet   H/O angiography 02/2019   Per [place] Patient Packet   H/O CT scan of head 01/2020   Per [place] New Patient Packet   H/O cystoscopy 03/12/2014   Dr.Kelly Venia Minks, Per [facility] New Patient packet   H/O laser iridotomy 06/2015   Per [facility] New Patient Packet   Hematuria    Per [facility] New Patient packet   Henoch-Schonlein purpura [facility])    Dr.Heher, Per [facility] New Patient packet   Hives    Per records provided by patient at new patient appointment    Hypertension    Per [facility] New Patient packet   Low TSH level    Memory loss    Over the past 6 years (as of 2021), Per [facility] New Patient packet   Osteoporosis    Per [facility] New Patient packet   Retinal vein occlusion of left eye    Per [facility] New Patient packet   Thyroid disease    Per [facility] New Patient packet   Vitamin D deficiency    Per [facility] New Patient packet   Past Surgical History:  Past Surgical History:  Procedure Laterality Date   ABDOMINAL  HYSTERECTOMY     Fibroid tumor. Patient with ovaries. Per [facility] New Patient packet   BIOPSY THYROID     Dr.Rahmani, Per [facility] New Patient packet   BREAST BIOPSY  02/1987   Per [facility] New Patient Packet   BREAST BIOPSY  01/2001   Per [place] New Patient packet   COLONOSCOPY  05/17/2017   Per [facility] New Patient packet, performed by [facility] Steffanie Dunn at [facility] Polyp was removed.   MASTECTOMY Bilateral 02/2001   Per [place] Patient packet   MASTECTOMY MODIFIED RADICAL Bilateral 03/1987   Per [facility] New Patient packet   RENAL BIOPSY  2003   Done in [place]. Per records provided by patient at new patient appointment    TONSILLECTOMY  1944   Per [place] Patient packet   TOTAL HIP ARTHROPLASTY Left 10/13/2022   Procedure: LEFT TOTAL HIP ARTHROPLASTY ANTERIOR APPROACH;  Surgeon: Paralee Cancel, MD;  Location: [place];  Service: Orthopedics;  Laterality: Left;   TOTAL HIP ARTHROPLASTY Right 12/01/2022   Procedure: TOTAL HIP ARTHROPLASTY ANTERIOR APPROACH;  Surgeon: Paralee Cancel, MD;  Location: [facility];  Service:  Orthopedics;  Laterality: Right;   HPI:  Pt is an 89 y.o. female who presented to the ED after she reportedly bumped her R hip against a sink. Pt R hip fx and is s/p R THR by anterior direct approach. SLP consulted due to pt spitting out and pocketing food. PMH: L hip fx with THR 10/13/22, CKD, osteoporosis, breast CA s/p bil mastectomy, and Alzheimers disease.    Assessment / Plan / Recommendation  Clinical Impression  Pt was seen for bedside swallow evaluation. She was alert during the evaluation, but did not consistently follow commands and she could not provide any meaningful history. Pt verbalized throughout the majority of the evaluation, but utterances were incoherent, intermittently lacking syntactic accuracy, and often devoid of communicative intent. A complete oral mechanism could not be conducted due to pt's inability to follow commands, but oral inspection revealed adequate,  natural dentition. She presented with symptoms of oral phase dysphagia which are likely cognitively based. She demonstrated biting when the cup/straw/spoon were presented, spitting of most boluses which required mastication, and minimal mastication of dysphagia 2 boluses that were accepted. Mild lingual residue with dysphagia 2 boluses was cleared with a liquid wash. No s/s of aspiration of aspiration were noted with solids or liquids. A dysphagia 1 diet with thin liquids is recommended at this time. SLP is unsure of the pt's proximity to her cognitive baseline to assess prognosis for diet advancement; SLP will follow pt. SLP Visit Diagnosis: Dysphagia, unspecified (R13.10)    Aspiration Risk  Mild aspiration risk    Diet Recommendation Dysphagia 1 (Puree);Thin liquid   Liquid Administration via: Cup;Straw Medication Administration: Crushed with puree Supervision: Staff to assist with self feeding Compensations: Slow rate;Small sips/bites;Minimize environmental distractions Postural Changes: Seated upright at 90 degrees    Other  Recommendations Oral Care Recommendations: Oral care BID    Recommendations for follow up therapy are one component of a multi-disciplinary discharge planning process, led by the attending physician.  Recommendations may be updated based on patient status, additional functional criteria and insurance authorization.  Follow up Recommendations  (TBD)      Assistance Recommended at Discharge    Functional Status Assessment Patient has had a recent decline in their functional status and demonstrates the ability to make significant improvements in function in a reasonable and predictable amount of time.  Frequency and Duration min 2x/week  2 weeks       Prognosis Prognosis for improved oropharyngeal function: Fair Barriers to Reach Goals: Cognitive deficits      Swallow Study   General Date of Onset: 12/01/22 HPI: Pt is an 89 y.o. female who presented to the  ED after she reportedly bumped her R hip against a sink. Pt R hip fx and is s/p R THR by anterior direct approach. SLP consulted due to pt spitting out and pocketing food. PMH: L hip fx with THR 10/13/22, CKD, osteoporosis, breast CA s/p bil mastectomy, and Alzheimers disease. Type of Study: Bedside Swallow Evaluation Diet Prior to this Study: Regular;Thin liquids (Level 0) Temperature Spikes Noted: No Respiratory Status: Nasal cannula History of Recent Intubation: No Behavior/Cognition: Alert;Cooperative;Confused;Doesn't follow directions;Requires cueing Oral Cavity Assessment: Within Functional Limits Oral Care Completed by SLP: No Oral Cavity - Dentition: Dentures, not available;Edentulous Vision: Functional for self-feeding Self-Feeding Abilities: Total assist Patient Positioning: Upright in bed;Postural control adequate for testing Baseline Vocal Quality: Normal Volitional Cough: Cognitively unable to elicit Volitional Swallow: Unable to elicit    Oral/Motor/Sensory Function Overall Oral Motor/Sensory Function:  (  UTA)   Ice Chips Ice chips: Not tested   Thin Liquid Thin Liquid: Within functional limits Presentation: Straw    Nectar Thick Nectar Thick Liquid: Not tested   Honey Thick Honey Thick Liquid: Not tested   Puree Puree: Impaired Presentation: Spoon Oral Phase Impairments: Poor awareness of bolus   Solid     Solid: Impaired Presentation: Spoon Oral Phase Impairments: Poor awareness of bolus;Impaired mastication Oral Phase Functional Implications: Oral residue;Impaired mastication     Sevin Langenbach I. Hardin Negus, [place], [place] Office number [place] 12/02/2022,4:46 PM

## 2022-12-02 NOTE — Progress Notes (Signed)
Physical Therapy Treatment Patient Details Name: Emily Dawson MRN: [id number] DOB: 1934-08-01 Today's Date: 12/02/2022   History of Present Illness Pt admitted from [facility] memory care with R hip fx and now s/p R THR by anterior direct approach.  Pt with hx of L hip fx with THR 10/13/22, CKD, osteoporosis, breast CA s/p bil mastectomy, and Alzheimers    PT Comments    Pt requiring increased time and repetition of cues for all activities this pm 2* increased agitation.  Pt assisted from chair to [facility] for hygiene and up from [facility] to return to bed.  RN aware of increasing agitation.  Recommendations for follow up therapy are one component of a multi-disciplinary discharge planning process, led by the attending physician.  Recommendations may be updated based on patient status, additional functional criteria and insurance authorization.  Follow Up Recommendations  Can patient physically be transported by private vehicle: No    Assistance Recommended at Discharge Frequent or constant Supervision/Assistance  Patient can return home with the following A little help with walking and/or transfers;A little help with bathing/dressing/bathroom;Assistance with cooking/housework;Assist for transportation;Help with stairs or ramp for entrance   Equipment Recommendations  None recommended by PT    Recommendations for Other Services       Precautions / Restrictions Precautions Precautions: Fall;Other (comment) Precaution Comments: Significant dementia Restrictions Weight Bearing Restrictions: No Other Position/Activity Restrictions: WBAT     Mobility  Bed Mobility Overal bed mobility: Needs Assistance Bed Mobility: Sit to Supine, Rolling Rolling: Mod assist, +2 for physical assistance, +2 for safety/equipment   Supine to sit: +2 for physical assistance, +2 for safety/equipment, Min assist, Mod assist Sit to supine: Mod assist, +2 for physical assistance, +2 for safety/equipment   General  bed mobility comments: Assist of two to rotate pt on bed pad and to roll to side for hygiene - pt struggling to understand and follow cues    Transfers Overall transfer level: Needs assistance Equipment used: Rolling walker (2 wheels) Transfers: Sit to/from Stand Sit to Stand: Min assist, Mod assist, +2 safety/equipment, From elevated surface           General transfer comment: Increased time with cues for LE management and use of UEs to self assist    Ambulation/Gait Ambulation/Gait assistance: Min assist, +2 safety/equipment Gait Distance (Feet): 10 Feet Assistive device: Rolling walker (2 wheels) Gait Pattern/deviations: Step-to pattern, Step-through pattern, Decreased step length - right, Decreased step length - left, Shuffle, Trunk flexed       General Gait Details: cues for posture and position from RW; assistance for RW management and balance;  pt ambulated short distance from chair to [facility] and BSC to bed   Stairs             Wheelchair Mobility    Modified Rankin (Stroke Patients Only)       Balance Overall balance assessment: Needs assistance Sitting-balance support: No upper extremity supported, Feet supported Sitting balance-Leahy Scale: Fair     Standing balance support: Bilateral upper extremity supported Standing balance-Leahy Scale: Poor                              Cognition Arousal/Alertness: Awake/alert Behavior During Therapy: Impulsive Overall Cognitive Status: History of cognitive impairments - at baseline  General Comments: Repetition and tactile cues needed to gain participation        Exercises      General Comments        Pertinent Vitals/Pain Pain Assessment Pain Assessment: Faces Faces Pain Scale: Hurts little more    Home Living Family/patient expects to be discharged to:: Unsure                   Additional Comments: Pt from memory care at  [facility] and would like to return there if facility can accept    Prior Function            PT Goals (current goals can now be found in the care plan section) Acute Rehab PT Goals Patient Stated Goal: No goals stated PT Goal Formulation: With family Time For Goal Achievement: 12/15/22 Potential to Achieve Goals: Good Progress towards PT goals: Not progressing toward goals - comment (Increased agitation this pm vs am)    Frequency    7X/week      PT Plan Current plan remains appropriate    Co-evaluation              AM-PAC PT "6 Clicks" Mobility   Outcome Measure  Help needed turning from your back to your side while in a flat bed without using bedrails?: A Lot Help needed moving from lying on your back to sitting on the side of a flat bed without using bedrails?: A Lot Help needed moving to and from a bed to a chair (including a wheelchair)?: A Lot Help needed standing up from a chair using your arms (e.g., wheelchair or bedside chair)?: A Lot Help needed to walk in hospital room?: A Little Help needed climbing 3-5 steps with a railing? : A Lot 6 Click Score: 13    End of Session Equipment Utilized During Treatment: Gait belt Activity Tolerance: Patient tolerated treatment well Patient left: in chair;with call bell/phone within reach;with chair alarm set;with family/visitor present Nurse Communication: Mobility status PT Visit Diagnosis: Unsteadiness on feet (R26.81);History of falling (Z91.81);Difficulty in walking, not elsewhere classified (R26.2);Pain Pain - Right/Left: Right Pain - part of body: Hip     Time: [id number] PT Time Calculation (min) (ACUTE ONLY): 30 min  Charges:  $Gait Training: 8-22 mins $Therapeutic Activity: 8-22 mins                     Debe Coder PT Acute Rehabilitation Services Pager [phone] Office [phone]    Crysta Gulick 12/02/2022, 3:35 PM

## 2022-12-02 NOTE — TOC Initial Note (Signed)
Transition of Care [facility]) - Initial/Assessment Note   Patient Details  Name: Emily Dawson MRN: [id number] Date of Birth: 1933/09/08  [facility] CM/SW Contact:    Sherie Don, LCSW Phone Number: 12/02/2022, 2:16 PM  Clinical Narrative: [facility] consulted for possible SNF placement. CSW spoke with patient's daughter, [name], regarding PT's evaluation. Per daughter, the family is waiting to hear back from the facility to see if the patient can return to memory care at [facility] or if she will need to go to SNF first. Patient will require an FL2 at either venue. FL2 started. TOC awaiting update from family.                Expected Discharge Plan: Skilled Nursing Facility Barriers to Discharge: Continued Medical Work up  Patient Goals and CMS Choice Patient states their goals for this hospitalization and ongoing recovery are:: Return to memory care or go to rehab CMS Medicare.gov Compare Post Acute Care list provided to:: Patient Represenative (must comment) Choice offered to / list presented to : Adult Children  Expected Discharge Plan and Services In-house Referral: Clinical Social Work Post Acute Care Choice: [place] Living arrangements for the past 2 months: [place]           DME Arranged: N/A DME Agency: NA  Prior Living Arrangements/Services Living arrangements for the past 2 months: [place] Lives with:: Facility Resident Patient language and need for interpreter reviewed:: Yes Do you feel safe going back to the place where you live?: Yes      Need for Family Participation in Patient Care: Yes (Comment) (Patient is disoriented x4.) Care giver support system in place?: Yes (comment) Criminal Activity/Legal Involvement Pertinent to Current Situation/Hospitalization: No - Comment as needed  Activities of Daily Living Home Assistive Devices/Equipment: Other (Comment) (uta) ADL Screening (condition at time of  admission) Patient's cognitive ability adequate to safely complete daily activities?: No Is the patient deaf or have difficulty hearing?: Yes Does the patient have difficulty seeing, even when wearing glasses/contacts?: No (uta) Does the patient have difficulty concentrating, remembering, or making decisions?: Yes Patient able to express need for assistance with ADLs?: No Does the patient have difficulty dressing or bathing?: Yes Independently performs ADLs?: No Does the patient have difficulty walking or climbing stairs?: Yes (uta) Weakness of Legs: None (uta) Weakness of Arms/Hands: None  Emotional Assessment Orientation: :  (Disoriented x4) Alcohol / Substance Use: Not Applicable  Admission diagnosis:  Closed subcapital fracture of right femur, initial encounter [S72.011A] Closed right hip fracture, initial encounter [S72.001A] Patient Active Problem List   Diagnosis Date Noted   S/P total right hip arthroplasty 12/01/2022   Closed right hip fracture, initial encounter ([place]) 11/30/2022   Left displaced femoral neck fracture ([place]) 10/13/2022   Falls, initial encounter 10/13/2022   Leukocytosis 10/13/2022   Abnormal chest x-ray 10/13/2022   Dehydration 10/13/2022   Acute urinary retention 10/13/2022   S/P total left hip arthroplasty 10/13/2022   Palliative care encounter 05/10/2022   Osteoporosis    Vitamin D deficiency    Dementia ([place])    Hypertension    CKD (chronic kidney disease) stage 3, GFR 30-59 ml/min ([place])    Low TSH level    PCP:  Edmonia Caprio, NP Pharmacy:   [place], [place] - [address] [place] [address] Phone: [phone] Fax: [phone]  Social Determinants of Health (SDOH) Social History:  SDOH Screenings   Depression (PHQ2-9): Low Risk  (07/11/2022)  Tobacco Use: Low Risk  (12/02/2022)   SDOH Interventions:    Readmission Risk Interventions     No data  to display

## 2022-12-02 NOTE — Hospital Course (Addendum)
Taken from prior note.  Emily Dawson is a 89 y.o. female with medical history significant of Alzheimer's dz, CKD 3a presented to s/p mechanical fall with right hip pain. She was unable to put weight on the right leg. X-ray at the facility confirmed right hip fracture. Patient recently had left hip fracture and February and she underwent THA with Dr. Alvan Dame. She has done extremely well after the surgery and was able to ambulate until last night. She is admitted for closed right hip fracture. Orthopedics is consulted.   Patient underwent right total hip arthroplasty 12/01/2022.  3/29: Vital stable.  She spit out some of her morning meds which were crushed with applesauce.  Not following most of the commands.  Labs with decrease of hemoglobin to 9.1 from 12 most likely secondary to trauma and blood loss during surgery.  Started on iron supplement.  Patient is from a memory care unit for which she was getting 24/7 private sitter and physical therapy.  PT is recommending SNF.  Patient is high risk for mortality based on advanced age, advanced dementia and recent hip fractures.

## 2022-12-02 NOTE — Progress Notes (Signed)
  Progress Note   Patient: Emily Dawson [id number] DOB: 1934/01/25 DOA: 11/30/2022     2 DOS: the patient was seen and examined on 12/02/2022   Brief hospital course: Taken from prior note.  Emily Dawson is a 89 y.o. female with medical history significant of Alzheimer's dz, CKD 3a presented to s/p mechanical fall with right hip pain. She was unable to put weight on the right leg. X-ray at the facility confirmed right hip fracture. Patient recently had left hip fracture and February and she underwent THA with Dr. Alvan Dame. She has done extremely well after the surgery and was able to ambulate until last night. She is admitted for closed right hip fracture. Orthopedics is consulted.   Patient underwent right total hip arthroplasty 12/01/2022.  3/29: Vital stable.  She spit out some of her morning meds which were crushed with applesauce.  Not following most of the commands.  Labs with decrease of hemoglobin to 9.1 from 12 most likely secondary to trauma and blood loss during surgery.  Started on iron supplement.  Patient is from a memory care unit for which she was getting 24/7 private sitter and physical therapy.  PT is recommending SNF.  Patient is high risk for mortality based on advanced age, advanced dementia and recent hip fractures.    Assessment and Plan: * Closed right hip fracture, initial encounter ([place]) R hip fx, h/o L hip fx in Feb, repaired by Dr. Alvan Dame. Underwent total hip arthroplasty on 12/01/2022 Did well post repair, had rehabbed, was ambulating without need for walker. PT are recommending SNF -TOC consult -Continue with pain management and supportive care  CKD (chronic kidney disease) stage 3, GFR 30-59 ml/min (HCC) Stable and at baseline. -Monitor renal function -Avoid nephrotoxin  Dementia ([place]) Pt with advanced alzheimer's at baseline. Cont seroquel, PRN xanax       Subjective: Patient was resting comfortably when seen today.  Denies any pain.  Physical  Exam: Vitals:   12/02/22 0544 12/02/22 0700 12/02/22 0750 12/02/22 0826  BP: (!) 160/81 (!) 142/80    Pulse: 94 92    Resp: 16 18    Temp: 98.2 F (36.8 C)     TempSrc: Oral     SpO2: 100%  99% 94%  Weight:      Height:       General.  Frail and malnourished elderly lady, in no acute distress. Pulmonary.  Lungs clear bilaterally, normal respiratory effort. CV.  Regular rate and rhythm, no JVD, rub or murmur. Abdomen.  Soft, nontender, nondistended, BS positive. CNS.  Alert and oriented to self only.  No focal neurologic deficit. Extremities.  No edema, no cyanosis, pulses intact and symmetrical. Psychiatry.  Judgment and insight appears impaired  Data Reviewed: Prior data reviewed  Family Communication: Discussed with daughter at bedside  Disposition: Status is: Inpatient Remains inpatient appropriate because: Severity of illness  Planned Discharge Destination: Skilled nursing facility  Time spent: 44 minutes  This record has been created using Systems analyst. Errors have been sought and corrected,but may not always be located. Such creation errors do not reflect on the standard of care.   Author: Lorella Nimrod, MD 12/02/2022 2:17 PM  For on call review [email or web address].

## 2022-12-02 NOTE — Evaluation (Signed)
Physical Therapy Evaluation Patient Details Name: Emily Dawson MRN: [id number] DOB: 11/26/1933 Today's Date: 12/02/2022  History of Present Illness  Pt admitted from [facility] memory care with R hip fx and now s/p R THR by anterior direct approach.  Pt with hx of L hip fx with THR 10/13/22, CKD, osteoporosis, breast CA s/p bil mastectomy, and Alzheimers  Clinical Impression  Pt admitted as above and presenting with functional mobility limitations 2* decreased R LE strength/ROM, post op pain and dementia related cognitive deficits.  With repetition and increased time, pt very cooperative and up to ambulate limited distance in hall.  Pt family is hopeful that pt can return to [facility] care (as she did after previous hip fx) and is currently in discussions with this facility.  If this is not possible, pt would benefit from follow up SNF level rehab to maximize IND and safety.     Recommendations for follow up therapy are one component of a multi-disciplinary discharge planning process, led by the attending physician.  Recommendations may be updated based on patient status, additional functional criteria and insurance authorization.  Follow Up Recommendations       Assistance Recommended at Discharge    Patient can return home with the following       Equipment Recommendations None recommended by PT  Recommendations for Other Services       Functional Status Assessment       Precautions / Restrictions Precautions Precautions: Fall;Other (comment) Precaution Comments: Significant dementia Restrictions Weight Bearing Restrictions: No Other Position/Activity Restrictions: WBAT      Mobility  Bed Mobility Overal bed mobility: Needs Assistance Bed Mobility: Supine to Sit     Supine to sit: +2 for physical assistance, +2 for safety/equipment, Min assist, Mod assist     General bed mobility comments: Assist of two to rotate pt on bed pad - pt struggling to understand  and follow cues    Transfers Overall transfer level: Needs assistance Equipment used: Rolling walker (2 wheels) Transfers: Sit to/from Stand Sit to Stand: Min assist, Mod assist, +2 safety/equipment, From elevated surface           General transfer comment: Increased time with cues for LE management and use of UEs to self assist    Ambulation/Gait Ambulation/Gait assistance: Min assist, +2 safety/equipment Gait Distance (Feet): 54 Feet Assistive device: Rolling walker (2 wheels) Gait Pattern/deviations: Step-to pattern, Step-through pattern, Decreased step length - right, Decreased step length - left, Shuffle, Trunk flexed       General Gait Details: cues for posture and position from RW; assistance for RW management and balance; chair follow for safety  Stairs            Wheelchair Mobility    Modified Rankin (Stroke Patients Only)       Balance Overall balance assessment: Needs assistance Sitting-balance support: No upper extremity supported, Feet supported Sitting balance-Leahy Scale: Fair     Standing balance support: Bilateral upper extremity supported Standing balance-Leahy Scale: Poor                               Pertinent Vitals/Pain Pain Assessment Pain Assessment: Faces Faces Pain Scale: Hurts little more    Home Living Family/patient expects to be discharged to:: Unsure                   Additional Comments: Pt from memory care at [facility] and would like to  return there if facility can accept    Prior Function               Mobility Comments: Dtr present states pt was ambulating sans AD but always with supervision       Hand Dominance        Extremity/Trunk Assessment   Upper Extremity Assessment Upper Extremity Assessment: Generalized weakness    Lower Extremity Assessment Lower Extremity Assessment: Generalized weakness;RLE deficits/detail       Communication   Communication: HOH  Cognition  Arousal/Alertness: Awake/alert Behavior During Therapy: Impulsive Overall Cognitive Status: History of cognitive impairments - at baseline                                 General Comments: Repetition and tactile cues needed to gain participation        General Comments      Exercises     Assessment/Plan    PT Assessment Patient needs continued PT services  PT Problem List Decreased strength;Decreased range of motion;Decreased activity tolerance;Decreased balance;Decreased mobility;Decreased knowledge of use of DME;Decreased cognition       PT Treatment Interventions DME instruction;Gait training;Functional mobility training;Therapeutic activities;Therapeutic exercise;Balance training;Patient/family education    PT Goals (Current goals can be found in the Care Plan section)  Acute Rehab PT Goals Patient Stated Goal: No goals stated PT Goal Formulation: With family Time For Goal Achievement: 12/15/22 Potential to Achieve Goals: Good    Frequency 7X/week     Co-evaluation               AM-PAC PT "6 Clicks" Mobility  Outcome Measure Help needed turning from your back to your side while in a flat bed without using bedrails?: A Lot Help needed moving from lying on your back to sitting on the side of a flat bed without using bedrails?: A Lot Help needed moving to and from a bed to a chair (including a wheelchair)?: A Lot Help needed standing up from a chair using your arms (e.g., wheelchair or bedside chair)?: A Lot Help needed to walk in hospital room?: A Little Help needed climbing 3-5 steps with a railing? : A Lot 6 Click Score: 13    End of Session Equipment Utilized During Treatment: Gait belt Activity Tolerance: Patient tolerated treatment well Patient left: in chair;with call bell/phone within reach;with chair alarm set;with family/visitor present Nurse Communication: Mobility status PT Visit Diagnosis: Unsteadiness on feet (R26.81);History of  falling (Z91.81);Difficulty in walking, not elsewhere classified (R26.2);Pain Pain - Right/Left: Right Pain - part of body: Hip    Time: [id number] PT Time Calculation (min) (ACUTE ONLY): 38 min   Charges:   PT Evaluation $PT Eval Low Complexity: 1 Low PT Treatments $Gait Training: 8-22 mins        Debe Coder PT Acute Rehabilitation Services Pager [phone] Office [phone]   Marian Grandt 12/02/2022, 1:17 PM

## 2022-12-03 DIAGNOSIS — D62 Acute posthemorrhagic anemia: Secondary | ICD-10-CM | POA: Diagnosis present

## 2022-12-03 DIAGNOSIS — G309 Alzheimer's disease, unspecified: Secondary | ICD-10-CM | POA: Diagnosis not present

## 2022-12-03 DIAGNOSIS — N1831 Chronic kidney disease, stage 3a: Secondary | ICD-10-CM | POA: Diagnosis not present

## 2022-12-03 DIAGNOSIS — F0280 Dementia in other diseases classified elsewhere without behavioral disturbance: Secondary | ICD-10-CM | POA: Diagnosis not present

## 2022-12-03 DIAGNOSIS — S72001A Fracture of unspecified part of neck of right femur, initial encounter for closed fracture: Secondary | ICD-10-CM | POA: Diagnosis not present

## 2022-12-03 LAB — CBC
HCT: 24.5 % — ABNORMAL LOW (ref 36.0–46.0)
Hemoglobin: 7.8 g/dL — ABNORMAL LOW (ref 12.0–15.0)
MCH: 33.1 pg (ref 26.0–34.0)
MCHC: 31.8 g/dL (ref 30.0–36.0)
MCV: 103.8 fL — ABNORMAL HIGH (ref 80.0–100.0)
Platelets: 190 10*3/uL (ref 150–400)
RBC: 2.36 MIL/uL — ABNORMAL LOW (ref 3.87–5.11)
RDW: 16 % — ABNORMAL HIGH (ref 11.5–15.5)
WBC: 14.1 10*3/uL — ABNORMAL HIGH (ref 4.0–10.5)
nRBC: 0 % (ref 0.0–0.2)

## 2022-12-03 MED ORDER — FE FUM-VIT C-VIT B12-FA 460-60-0.01-1 MG PO CAPS
1.0000 | ORAL_CAPSULE | Freq: Two times a day (BID) | ORAL | Status: DC
  Administered 2022-12-03 (×2): 1 via ORAL
  Filled 2022-12-03 (×5): qty 1

## 2022-12-03 NOTE — Assessment & Plan Note (Signed)
R hip fx, h/o L hip fx in Feb, repaired by Dr. Alvan Dame. Underwent total hip arthroplasty on 12/01/2022 Did well post repair, had rehabbed, was ambulating without need for walker. PT are recommending SNF -TOC consult -Continue with pain management and supportive care

## 2022-12-03 NOTE — Progress Notes (Signed)
Progress Note   Patient: Emily Dawson [id number] DOB: 09-Feb-1934 DOA: 11/30/2022     3 DOS: the patient was seen and examined on 12/03/2022   Brief hospital course: Taken from prior note.  Emily Dawson is a 89 y.o. female with medical history significant of Alzheimer's dz, CKD 3a presented to s/p mechanical fall with right hip pain. She was unable to put weight on the right leg. X-ray at the facility confirmed right hip fracture. Patient recently had left hip fracture and February and she underwent THA with Dr. Alvan Dame. She has done extremely well after the surgery and was able to ambulate until last night. She is admitted for closed right hip fracture. Orthopedics is consulted.   Patient underwent right total hip arthroplasty 12/01/2022.  3/29: Vital stable.  She spit out some of her morning meds which were crushed with applesauce.  Not following most of the commands.  Labs with decrease of hemoglobin to 9.1 from 12 most likely secondary to trauma and blood loss during surgery.  Started on iron supplement.  Patient is from a memory care unit for which she was getting 24/7 private sitter and physical therapy.  PT is recommending SNF.  Family is trying to see if she can go back to her memory care unit with some physical therapy.  3/30: Vitals with mild tachycardia and blood pressure at 159/78.  Swallow evaluation with mild aspiration risk and they were recommending dysphagia 1 diet with thin liquids.  Labs with improving leukocytosis at 14, hemoglobin decreased to 7.8, all cell lines decrease.  IVF fluid was discontinued.  Increasing the dose of iron supplement. Daughter does not want any more blood transfusions and would like to focus on comfort. Might not be able to go back to her prior facility as due to the lack of services she needs. TOC is working on it  Patient is high risk for mortality based on advanced age, advanced dementia and recent hip fractures.    Assessment and Plan: *  Closed right hip fracture, initial encounter ([place]) R hip fx, h/o L hip fx in Feb, repaired by Dr. Alvan Dame. Underwent total hip arthroplasty on 12/01/2022 Did well post repair, had rehabbed, was ambulating without need for walker. PT are recommending SNF -TOC consult -Continue with pain management and supportive care  CKD (chronic kidney disease) stage 3, GFR 30-59 ml/min (HCC) Stable and at baseline. -Monitor renal function -Avoid nephrotoxin  Dementia ([place]) Pt with advanced alzheimer's at baseline. Cont seroquel, PRN xanax  Acute postoperative anemia due to expected blood loss Hemoglobin with gradual decline after the surgery, at 7.8 today.  Also noted macrocytosis.  Patient was started on supplement. Daughter does not want any blood transfusions. -Continue to monitor       Subjective: Patient was eating ice cream with the help of a caregiver when seen today.  No new concern.  Daughter at bedside.  She does not want her to get any blood and they are looking for a different facility.  Physical Exam: Vitals:   12/02/22 0826 12/02/22 2159 12/03/22 0534 12/03/22 1336  BP:  (!) 150/73 (!) 159/78 116/60  Pulse:  (!) 104 (!) 108 73  Resp:  17 17 16   Temp:  99 F (37.2 C) 98.1 F (36.7 C) 97.6 F (36.4 C)  TempSrc:  Axillary Oral Oral  SpO2: 94% 90% 90% 90%  Weight:      Height:       General.  Frail and malnourished elderly lady, in no  acute distress. Pulmonary.  Lungs clear bilaterally, normal respiratory effort. CV.  Regular rate and rhythm, no JVD, rub or murmur. Abdomen.  Soft, nontender, nondistended, BS positive. CNS.  Alert and oriented .  No focal neurologic deficit. Extremities.  No edema, no cyanosis, pulses intact and symmetrical. Psychiatry.  Judgment and insight appears impaired.  Data Reviewed: Prior data reviewed  Family Communication: Discussed with daughter at bedside  Disposition: Status is: Inpatient Remains inpatient appropriate because: Severity of  illness  Planned Discharge Destination: Skilled nursing facility  Time spent: 40 minutes  This record has been created using Systems analyst. Errors have been sought and corrected,but may not always be located. Such creation errors do not reflect on the standard of care.   Author: Lorella Nimrod, MD 12/03/2022 1:40 PM  For on call review [email or web address].

## 2022-12-03 NOTE — Progress Notes (Signed)
PT Cancellation Note  Patient Details Name: Emily Dawson MRN: [id number] DOB: Jun 09, 1934   Cancelled Treatment:     PT attempted but pt sleeping and not rousable.  Will follow.   Derrek Puff 12/03/2022, 11:53 AM

## 2022-12-03 NOTE — TOC Progression Note (Addendum)
Transition of Care [facility]) - Progression Note    Patient Details  Name: Emily Dawson MRN: [id number] Date of Birth: [birth year]  [facility] CM/SW Contact  Henrietta Dine, RN Phone Number: 12/03/2022, 11:11 AM  Clinical Narrative:    Notified pt's dtr [name] would like to discuss discharge plans; talked w/ [name] in room and [place] on speaker phone; they express concern that pt will not be able to return to [place]; explained MD decides when pt is medically stable for d/c be, and pt assessment will determine the leveo of care she will need at d/c; facility will determine if they can provide the required level of care; also explained SNF process in the event pt can not return to [facility]; they verbalize understanding and if pt is to d/c SNF their preference is [facility]; TOC will follow.  -[phone] - LVM for Rollene Fare at [facility] to discuss pt's return; also faxed out [place]; awaiting bed offers   Expected Discharge Plan: [place] Barriers to Discharge: Continued Medical Work up  Expected Discharge Plan and Services In-house Referral: Clinical Social Work   Post Acute Care Choice: [place] Living arrangements for the past 2 months: [place]                 DME Arranged: N/A DME Agency: NA                   Social Determinants of Health (SDOH) Interventions SDOH Screenings   Food Insecurity: No Food Insecurity (12/02/2022)  Housing: Low Risk  (12/02/2022)  Transportation Needs: No Transportation Needs (12/02/2022)  Utilities: Not At Risk (12/02/2022)  Depression (PHQ2-9): Low Risk  (07/11/2022)  Tobacco Use: Low Risk  (12/02/2022)    Readmission Risk Interventions     No data to display

## 2022-12-03 NOTE — Plan of Care (Signed)
  Problem: Nutrition: Goal: Adequate nutrition will be maintained Outcome: Progressing   Problem: Pain Managment: Goal: General experience of comfort will improve Outcome: Progressing   

## 2022-12-03 NOTE — Assessment & Plan Note (Signed)
Hemoglobin with gradual decline after the surgery, at 7.8 today.  Also noted macrocytosis.  Patient was started on supplement. Daughter does not want any blood transfusions. -Continue to monitor

## 2022-12-03 NOTE — NC FL2 (Signed)
[place] LEVEL OF CARE FORM     IDENTIFICATION  Patient Name: Emily Dawson Birthdate: 08-10-1934 Sex: female Admission Date (Current Location): 11/30/2022  [facility] and [place] Number:  Herbalist and Address:  [facility],  [place], [place]      Provider Number: [id number]  Attending Physician Name and Address:  Lorella Nimrod, MD  Relative Name and Phone Number:  [name] (daughter) Ph: [phone]    Current Level of Care: Hospital Recommended Level of Care: [place] Prior Approval Number:    Date Approved/Denied:   PASRR Number: [id number]  Discharge Plan: SNF    Current Diagnoses: Patient Active Problem List   Diagnosis Date Noted   S/P total right hip arthroplasty 12/01/2022   Closed right hip fracture, initial encounter ([place]) 11/30/2022   Left displaced femoral neck fracture ([place]) 10/13/2022   Falls, initial encounter 10/13/2022   Leukocytosis 10/13/2022   Abnormal chest x-ray 10/13/2022   Dehydration 10/13/2022   Acute urinary retention 10/13/2022   S/P total left hip arthroplasty 10/13/2022   Palliative care encounter 05/10/2022   Osteoporosis    Vitamin D deficiency    Dementia ([place])    Hypertension    CKD (chronic kidney disease) stage 3, GFR 30-59 ml/min (HCC)    Low TSH level     Orientation RESPIRATION BLADDER Height & Weight     Self  Normal Incontinent Weight: 45.4 kg Height:  5\' 4"  (162.6 cm)  BEHAVIORAL SYMPTOMS/MOOD NEUROLOGICAL BOWEL NUTRITION STATUS     (N/A) Continent Diet (Dysphasia I (puree), thin liquids)  AMBULATORY STATUS COMMUNICATION OF NEEDS Skin   Extensive Assist Verbally Surgical wounds, Other (Comment) (Ecchymosis: bilateral arms; recent surgical incision from hip replacement surgery.)                       Personal Care Assistance Level of Assistance  Bathing, Feeding, Dressing Bathing Assistance: Limited assistance Feeding assistance:  Limited assistance Dressing Assistance: Limited assistance     Functional Limitations Info  Sight, Hearing, Speech Sight Info: Adequate Hearing Info: Adequate Speech Info: Adequate    SPECIAL CARE FACTORS FREQUENCY  PT (By licensed PT), OT (By licensed OT)     PT Frequency: 5x/week OT Frequency: 5x/week            Contractures Contractures Info: Not present    Additional Factors Info  Code Status, Allergies, Psychotropic Code Status Info: DNR Allergies Info: Nsaids, Ampicillin, Arimidex (Anastrozole), Azithromycin, Celecoxib, Fosamax (Alendronate), Ibuprofen, Naproxen, Penicillins, Sulfa Antibiotics, Tetracyclines & Related, Losartan Psychotropic Info: Seroquel, Desyrel, Xanax         Current Medications (12/03/2022):  This is the current hospital active medication list Current Facility-Administered Medications  Medication Dose Route Frequency Provider Last Rate Last Admin   acetaminophen (TYLENOL) tablet 325-650 mg  325-650 mg Oral Q6H PRN Irving Copas, PA-C       ALPRAZolam Duanne Moron) tablet 0.25 mg  0.25 mg Oral TID PRN Irving Copas, PA-C       amLODipine (NORVASC) tablet 5 mg  5 mg Oral Daily Irving Copas, PA-C   5 mg at 12/03/22 0935   aspirin chewable tablet 81 mg  81 mg Oral BID Irving Copas, PA-C   81 mg at 12/03/22 0935   bisacodyl (DULCOLAX) suppository 10 mg  10 mg Rectal Daily PRN Irving Copas, PA-C       diphenhydrAMINE (BENADRYL) 12.5 MG/5ML elixir 12.5-25 mg  12.5-25 mg Oral Q4H PRN Irving Copas, PA-C       docusate sodium (COLACE) capsule 100 mg  100 mg Oral BID Irving Copas, PA-C   100 mg at 12/03/22 [id number]   Fe Fum-Vit C-Vit B12-FA (TRIGELS-F FORTE) capsule 1 capsule  1 capsule Oral BID Lorella Nimrod, MD   1 capsule at 12/03/22 0936   feeding supplement (ENSURE ENLIVE / ENSURE PLUS) liquid 237 mL  237 mL Oral BID BM Duard Brady, MD   237 mL at 12/03/22 0936   HYDROcodone-acetaminophen (NORCO/VICODIN) 5-325 MG per tablet 1  tablet  1 tablet Oral Q4H PRN Irving Copas, PA-C   1 tablet at 12/03/22 1029   melatonin tablet 5 mg  5 mg Oral QHS Irving Copas, PA-C   5 mg at 12/02/22 2127   menthol-cetylpyridinium (CEPACOL) lozenge 3 mg  1 lozenge Oral PRN Irving Copas, PA-C       Or   phenol (CHLORASEPTIC) mouth spray 1 spray  1 spray Mouth/Throat PRN Irving Copas, PA-C       methocarbamol (ROBAXIN) tablet 500 mg  500 mg Oral Q6H PRN Irving Copas, PA-C       Or   methocarbamol (ROBAXIN) 500 mg in dextrose 5 % 50 mL IVPB  500 mg Intravenous Q6H PRN Irving Copas, PA-C       metoCLOPramide (REGLAN) tablet 5-10 mg  5-10 mg Oral Q8H PRN Irving Copas, PA-C       Or   metoCLOPramide (REGLAN) injection 5-10 mg  5-10 mg Intravenous Q8H PRN Irving Copas, PA-C       metoprolol succinate (TOPROL-XL) 24 hr tablet 50 mg  50 mg Oral BID Irving Copas, PA-C   50 mg at 12/03/22 0935   morphine (PF) 2 MG/ML injection 0.5-1 mg  0.5-1 mg Intravenous Q2H PRN Irving Copas, PA-C   1 mg at 12/02/22 1732   ondansetron (ZOFRAN) tablet 4 mg  4 mg Oral Q6H PRN Irving Copas, PA-C       Or   ondansetron [facility]) injection 4 mg  4 mg Intravenous Q6H PRN Costella Hatcher R, PA-C       polyethylene glycol (MIRALAX / GLYCOLAX) packet 17 g  17 g Oral BID Irving Copas, PA-C   17 g at 12/03/22 [id number]   QUEtiapine (SEROQUEL) tablet 25 mg  25 mg Oral Daily Irving Copas, PA-C   25 mg at 12/03/22 0935   QUEtiapine (SEROQUEL) tablet 50 mg  50 mg Oral QHS Irving Copas, PA-C   50 mg at 12/02/22 2130   traZODone (DESYREL) tablet 100 mg  100 mg Oral QHS Irving Copas, PA-C   100 mg at 12/02/22 2128     Discharge Medications: Please see discharge summary for a list of discharge medications.  Relevant Imaging Results:  Relevant Lab Results:   Additional Information SSN: [id number]  Henrietta Dine, RN

## 2022-12-04 DIAGNOSIS — F0280 Dementia in other diseases classified elsewhere without behavioral disturbance: Secondary | ICD-10-CM | POA: Diagnosis not present

## 2022-12-04 DIAGNOSIS — G309 Alzheimer's disease, unspecified: Secondary | ICD-10-CM | POA: Diagnosis not present

## 2022-12-04 DIAGNOSIS — S72001A Fracture of unspecified part of neck of right femur, initial encounter for closed fracture: Secondary | ICD-10-CM | POA: Diagnosis not present

## 2022-12-04 DIAGNOSIS — N1831 Chronic kidney disease, stage 3a: Secondary | ICD-10-CM | POA: Diagnosis not present

## 2022-12-04 NOTE — Plan of Care (Signed)
  Problem: Education: Goal: Knowledge of General Education information will improve Description: Including pain rating scale, medication(s)/side effects and non-pharmacologic comfort measures Outcome: Progressing   Problem: Nutrition: Goal: Adequate nutrition will be maintained Outcome: Progressing   Problem: Safety: Goal: Ability to remain free from injury will improve Outcome: Progressing   

## 2022-12-04 NOTE — Plan of Care (Signed)
  Problem: Nutrition: Goal: Adequate nutrition will be maintained Outcome: Progressing   Problem: Pain Managment: Goal: General experience of comfort will improve Outcome: Progressing   

## 2022-12-04 NOTE — Progress Notes (Signed)
Progress Note   Patient: Emily Dawson [id number] DOB: 1934/06/22 DOA: 11/30/2022     4 DOS: the patient was seen and examined on 12/04/2022   Brief hospital course: Taken from prior note.  Emily Dawson is a 89 y.o. female with medical history significant of Alzheimer's dz, CKD 3a presented to s/p mechanical fall with right hip pain. She was unable to put weight on the right leg. X-ray at the facility confirmed right hip fracture. Patient recently had left hip fracture and February and she underwent THA with Dr. Alvan Dame. She has done extremely well after the surgery and was able to ambulate until last night. She is admitted for closed right hip fracture. Orthopedics is consulted.   Patient underwent right total hip arthroplasty 12/01/2022.  3/29: Vital stable.  She spit out some of her morning meds which were crushed with applesauce.  Not following most of the commands.  Labs with decrease of hemoglobin to 9.1 from 12 most likely secondary to trauma and blood loss during surgery.  Started on iron supplement.  Patient is from a memory care unit for which she was getting 24/7 private sitter and physical therapy.  PT is recommending SNF.  Family is trying to see if she can go back to her memory care unit with some physical therapy.  3/30: Vitals with mild tachycardia and blood pressure at 159/78.  Swallow evaluation with mild aspiration risk and they were recommending dysphagia 1 diet with thin liquids.  Labs with improving leukocytosis at 14, hemoglobin decreased to 7.8, all cell lines decrease.  IVF fluid was discontinued.  Increasing the dose of iron supplement. Daughter does not want any more blood transfusions and would like to focus on comfort. Might not be able to go back to her prior facility as due to the lack of services she needs. TOC is working on it  3/31: Patient remained stable.  Awaiting disposition.  Patient is high risk for mortality based on advanced age, advanced dementia and  recent hip fractures.    Assessment and Plan: * Closed right hip fracture, initial encounter ([place]) R hip fx, h/o L hip fx in Feb, repaired by Dr. Alvan Dame. Underwent total hip arthroplasty on 12/01/2022 Did well post repair, had rehabbed, was ambulating without need for walker. PT are recommending SNF -TOC consult -Continue with pain management and supportive care  CKD (chronic kidney disease) stage 3, GFR 30-59 ml/min (HCC) Stable and at baseline. -Monitor renal function -Avoid nephrotoxin  Dementia ([place]) Pt with advanced alzheimer's at baseline. Cont seroquel, PRN xanax  Acute postoperative anemia due to expected blood loss Hemoglobin with gradual decline after the surgery, at 7.8 today.  Also noted macrocytosis.  Patient was started on supplement. Daughter does not want any blood transfusions. -Continue to monitor       Subjective: Patient was resting when seen today.  Caregiver at bedside.  No new concern.  Physical Exam: Vitals:   12/03/22 0534 12/03/22 1336 12/03/22 2153 12/04/22 0622  BP: (!) 159/78 116/60 (!) 143/77 (!) 155/70  Pulse: (!) 108 73 90 86  Resp: 17 16 16 20   Temp: 98.1 F (36.7 C) 97.6 F (36.4 C) 99.1 F (37.3 C) 98.8 F (37.1 C)  TempSrc: Oral Oral Oral Oral  SpO2: 90% 90% (!) 89% (!) 84%  Weight:      Height:       General.  Frail and malnourished elderly lady, in no acute distress. Pulmonary.  Lungs clear bilaterally, normal respiratory effort. CV.  Regular  rate and rhythm, no JVD, rub or murmur. Abdomen.  Soft, nontender, nondistended, BS positive. CNS.  Alert and oriented .  No focal neurologic deficit. Extremities.  No edema, no cyanosis, pulses intact and symmetrical. Psychiatry.  Judgment and insight appears impaired  Data Reviewed: Prior data reviewed  Family Communication: Discussed with caregiver at bedside  Disposition: Status is: Inpatient Remains inpatient appropriate because: Severity of illness  Planned Discharge  Destination: Skilled nursing facility  Time spent: 39 minutes  This record has been created using Systems analyst. Errors have been sought and corrected,but may not always be located. Such creation errors do not reflect on the standard of care.   Author: Lorella Nimrod, MD 12/04/2022 11:26 AM  For on call review [email or web address].

## 2022-12-04 NOTE — Progress Notes (Signed)
Physical Therapy Treatment Patient Details Name: Emily Dawson MRN: [id number] DOB: [birth year] Today's Date: 12/04/2022   History of Present Illness Pt admitted from [facility] memory care with R hip fx and now s/p R THR by anterior direct approach.  Pt with hx of L hip fx with THR 10/13/22, CKD, osteoporosis, breast CA s/p bil mastectomy, and Alzheimers    PT Comments    Pt much more alert than yesterday and able to participate with PT with ++ encouragement and ++multimodal cues.  Pt up to ambulate ~90' in hall with RW and assist for balance, safety and RW management.   Recommendations for follow up therapy are one component of a multi-disciplinary discharge planning process, led by the attending physician.  Recommendations may be updated based on patient status, additional functional criteria and insurance authorization.  Follow Up Recommendations  Can patient physically be transported by private vehicle: No    Assistance Recommended at Discharge Frequent or constant Supervision/Assistance  Patient can return home with the following A little help with walking and/or transfers;A little help with bathing/dressing/bathroom;Assistance with cooking/housework;Assist for transportation;Help with stairs or ramp for entrance   Equipment Recommendations  None recommended by PT    Recommendations for Other Services       Precautions / Restrictions Precautions Precautions: Fall;Other (comment) Precaution Comments: Significant dementia Restrictions Weight Bearing Restrictions: No RLE Weight Bearing: Weight bearing as tolerated Other Position/Activity Restrictions: WBAT     Mobility  Bed Mobility Overal bed mobility: Needs Assistance Bed Mobility: Rolling, Supine to Sit     Supine to sit: +2 for physical assistance, +2 for safety/equipment, Min assist, Mod assist     General bed mobility comments: Assist of two to rotate pt on bed pad - pt struggling to understand and follow cues     Transfers Overall transfer level: Needs assistance Equipment used: Rolling walker (2 wheels) Transfers: Sit to/from Stand Sit to Stand: Min assist, Mod assist, +2 safety/equipment, From elevated surface           General transfer comment: Increased time with cues for LE management and use of UEs to self assist    Ambulation/Gait Ambulation/Gait assistance: Min assist, +2 safety/equipment Gait Distance (Feet): 90 Feet Assistive device: Rolling walker (2 wheels) Gait Pattern/deviations: Step-to pattern, Step-through pattern, Decreased step length - right, Decreased step length - left, Shuffle, Trunk flexed       General Gait Details: cues for posture and position from RW; assistance for RW management and balance;   Stairs             Wheelchair Mobility    Modified Rankin (Stroke Patients Only)       Balance Overall balance assessment: Needs assistance Sitting-balance support: No upper extremity supported, Feet supported Sitting balance-Leahy Scale: Fair     Standing balance support: Single extremity supported Standing balance-Leahy Scale: Poor                              Cognition Arousal/Alertness: Awake/alert Behavior During Therapy: Impulsive Overall Cognitive Status: History of cognitive impairments - at baseline                                 General Comments: Repetition and tactile cues needed to gain participation        Exercises      General Comments        Pertinent  Vitals/Pain Pain Assessment Pain Assessment: Faces Faces Pain Scale: Hurts little more Pain Location: R hip Pain Descriptors / Indicators: Guarding, Grimacing Pain Intervention(s): Limited activity within patient's tolerance, Monitored during session (pt refused ice)    Home Living                          Prior Function            PT Goals (current goals can now be found in the care plan section) Acute Rehab PT  Goals Patient Stated Goal: No goals stated PT Goal Formulation: With family Time For Goal Achievement: 12/15/22 Potential to Achieve Goals: Good Progress towards PT goals: Progressing toward goals    Frequency    7X/week      PT Plan Current plan remains appropriate    Co-evaluation              AM-PAC PT "6 Clicks" Mobility   Outcome Measure  Help needed turning from your back to your side while in a flat bed without using bedrails?: A Lot Help needed moving from lying on your back to sitting on the side of a flat bed without using bedrails?: A Lot Help needed moving to and from a bed to a chair (including a wheelchair)?: A Lot Help needed standing up from a chair using your arms (e.g., wheelchair or bedside chair)?: A Lot Help needed to walk in hospital room?: A Little Help needed climbing 3-5 steps with a railing? : A Lot 6 Click Score: 13    End of Session Equipment Utilized During Treatment: Gait belt Activity Tolerance: Patient tolerated treatment well Patient left: in chair;with call bell/phone within reach;with chair alarm set;with family/visitor present Nurse Communication: Mobility status PT Visit Diagnosis: Unsteadiness on feet (R26.81);History of falling (Z91.81);Difficulty in walking, not elsewhere classified (R26.2);Pain Pain - Right/Left: Right Pain - part of body: Hip     Time: [id number] PT Time Calculation (min) (ACUTE ONLY): 17 min  Charges:  $Gait Training: 8-22 mins                     [place] Pager [phone] Office [phone]    [facility] 12/04/2022, 12:33 PM

## 2022-12-05 DIAGNOSIS — F0280 Dementia in other diseases classified elsewhere without behavioral disturbance: Secondary | ICD-10-CM | POA: Diagnosis not present

## 2022-12-05 DIAGNOSIS — S72001A Fracture of unspecified part of neck of right femur, initial encounter for closed fracture: Secondary | ICD-10-CM | POA: Diagnosis not present

## 2022-12-05 DIAGNOSIS — N1831 Chronic kidney disease, stage 3a: Secondary | ICD-10-CM | POA: Diagnosis not present

## 2022-12-05 DIAGNOSIS — G309 Alzheimer's disease, unspecified: Secondary | ICD-10-CM | POA: Diagnosis not present

## 2022-12-05 MED ORDER — ASPIRIN 81 MG PO CHEW: 81.0000 mg | CHEWABLE_TABLET | Freq: Two times a day (BID) | ORAL | 0 refills | Status: AC

## 2022-12-05 MED ORDER — METOPROLOL TARTRATE 25 MG/10 ML ORAL SUSPENSION
25.0000 mg | Freq: Two times a day (BID) | ORAL | Status: DC
  Administered 2022-12-05 – 2022-12-07 (×5): 25 mg via ORAL
  Filled 2022-12-05 (×5): qty 10

## 2022-12-05 MED ORDER — METHOCARBAMOL 500 MG PO TABS: 500.0000 mg | ORAL_TABLET | Freq: Three times a day (TID) | ORAL | 0 refills | Status: AC | PRN

## 2022-12-05 MED ORDER — HYDROCODONE-ACETAMINOPHEN 5-325 MG PO TABS: 1.0000 | ORAL_TABLET | Freq: Three times a day (TID) | ORAL | 0 refills | Status: AC | PRN

## 2022-12-05 MED ORDER — ADULT MULTIVITAMIN LIQUID CH
15.0000 mL | Freq: Every day | ORAL | Status: DC
  Administered 2022-12-05 – 2022-12-06 (×2): 15 mL via ORAL
  Filled 2022-12-05 (×3): qty 15

## 2022-12-05 NOTE — TOC Progression Note (Addendum)
Transition of Care [facility]) - Progression Note   Patient Details  Name: Emily Dawson MRN: [id number] Date of Birth: 10-18-1933  Transition of Care [facility]) CM/SW [place], LCSW Phone Number: 12/05/2022, 11:30 AM  Clinical Narrative: CSW spoke with patient's daughter, [name], regarding SNF versus returning to [facility]. Per daughter, the family are leaning towards SNF and would prefer [facility] or [facility]. Daughter reported the family has been paying an agency, [facility], that has been providing 24/7 care/supervision at memory care and would like the agency to continue providing care to the patient at SNF. CSW followed up with Soy in admissions at [place] in admissions at [facility] to see if either facility will have a bed available and be able to accept the patient with the aide services in place.  Addendum: CSW notified by Soy at [facility] that the facility cannot make a bed offer even with 24/7 care being provided.  Expected Discharge Plan: [place] Barriers to Discharge: Continued Medical Work up  Expected Discharge Plan and Services In-house Referral: Clinical Social Work Post Acute Care Choice: [place] Living arrangements for the past 2 months: [place]          DME Arranged: N/A DME Agency: NA  Social Determinants of Health (SDOH) Interventions SDOH Screenings   Food Insecurity: No Food Insecurity (12/02/2022)  Housing: Low Risk  (12/02/2022)  Transportation Needs: No Transportation Needs (12/02/2022)  Utilities: Not At Risk (12/02/2022)  Depression (PHQ2-9): Low Risk  (07/11/2022)  Tobacco Use: Low Risk  (12/02/2022)   Readmission Risk Interventions     No data to display

## 2022-12-05 NOTE — Progress Notes (Signed)
Physical Therapy Treatment Patient Details Name: Emily Dawson MRN: [id number] DOB: Nov 21, 1933 Today's Date: 12/05/2022   History of Present Illness Pt admitted from [facility] memory care with R hip fx and now s/p R THR by anterior direct approach.  Pt with hx of L hip fx with THR 10/13/22, CKD, osteoporosis, breast CA s/p bil mastectomy, and Alzheimers    PT Comments    Pt very sleepy but arouses with multi-modal stimuli, caregiver  present for session. Pt requires frequent redirection to task and repetitious cues for participation. Pt is able to amb short hallway distance with 2 person assist. Pt will benefit from post acute rehab  Recommendations for follow up therapy are one component of a multi-disciplinary discharge planning process, led by the attending physician.  Recommendations may be updated based on patient status, additional functional criteria and insurance authorization.  Follow Up Recommendations  Can patient physically be transported by private vehicle: No    Assistance Recommended at Discharge Frequent or constant Supervision/Assistance  Patient can return home with the following A little help with walking and/or transfers;A little help with bathing/dressing/bathroom;Assistance with cooking/housework;Assist for transportation;Help with stairs or ramp for entrance   Equipment Recommendations  None recommended by PT    Recommendations for Other Services       Precautions / Restrictions Precautions Precautions: Fall;Other (comment) Precaution Comments: Significant dementia Restrictions Weight Bearing Restrictions: No RLE Weight Bearing: Weight bearing as tolerated Other Position/Activity Restrictions: WBAT RLE     Mobility  Bed Mobility Overal bed mobility: Needs Assistance Bed Mobility: Supine to Sit     Supine to sit: +2 for physical assistance, +2 for safety/equipment, Mod assist     General bed mobility comments: Assist of two to rotate pt on bed pad,  able to hold therapist's hand to assist self elevating trunk - pt has difficulty following commands to sequence and  assist self    Transfers Overall transfer level: Needs assistance Equipment used: Rolling walker (2 wheels) Transfers: Sit to/from Stand Sit to Stand: Min assist, Mod assist, +2 safety/equipment, From elevated surface, +2 physical assistance           General transfer comment: Increased time with cues for LE management and use of UEs to self assist    Ambulation/Gait Ambulation/Gait assistance: Min assist, +2 safety/equipment, +2 physical assistance Gait Distance (Feet): 30 Feet Assistive device: Rolling walker (2 wheels), 2 person hand held assist Gait Pattern/deviations: Step-to pattern, Step-through pattern, Decreased step length - right, Decreased step length - left, Trunk flexed       General Gait Details: pt repeatedly removing hands from RW and pulling at gown or reaching for  objects in room; walker taken away and amb with bil HHA   Stairs             Wheelchair Mobility    Modified Rankin (Stroke Patients Only)       Balance Overall balance assessment: Needs assistance Sitting-balance support: No upper extremity supported, Feet supported Sitting balance-Leahy Scale: Fair     Standing balance support: Single extremity supported Standing balance-Leahy Scale: Poor                              Cognition Arousal/Alertness: Awake/alert Behavior During Therapy: Impulsive, Restless Overall Cognitive Status: History of cognitive impairments - at baseline  General Comments: Repetition and tactile cues needed to gain participation; pt is easily distracted and requires redirection to task        Exercises      General Comments        Pertinent Vitals/Pain Pain Assessment Pain Assessment: Faces Pain Location: R hip Pain Descriptors / Indicators: Guarding, Grimacing Pain  Intervention(s): Limited activity within patient's tolerance, Monitored during session, Premedicated before session, Repositioned    Home Living                          Prior Function            PT Goals (current goals can now be found in the care plan section) Acute Rehab PT Goals Patient Stated Goal: No goals stated PT Goal Formulation: With family Time For Goal Achievement: 12/15/22 Potential to Achieve Goals: Good Progress towards PT goals: Progressing toward goals    Frequency    7X/week      PT Plan Current plan remains appropriate    Co-evaluation              AM-PAC PT "6 Clicks" Mobility   Outcome Measure  Help needed turning from your back to your side while in a flat bed without using bedrails?: A Lot Help needed moving from lying on your back to sitting on the side of a flat bed without using bedrails?: Total Help needed moving to and from a bed to a chair (including a wheelchair)?: Total Help needed standing up from a chair using your arms (e.g., wheelchair or bedside chair)?: Total Help needed to walk in hospital room?: A Lot Help needed climbing 3-5 steps with a railing? : A Lot 6 Click Score: 9    End of Session Equipment Utilized During Treatment: Gait belt Activity Tolerance: Patient tolerated treatment well Patient left: in chair;with call bell/phone within reach;with chair alarm set;with family/visitor present Nurse Communication: Mobility status PT Visit Diagnosis: Unsteadiness on feet (R26.81);History of falling (Z91.81);Difficulty in walking, not elsewhere classified (R26.2);Pain Pain - Right/Left: Right Pain - part of body: Hip     Time: [id number] PT Time Calculation (min) (ACUTE ONLY): 13 min  Charges:  $Gait Training: 8-22 mins                     Baxter Flattery, PT  Acute Rehab Dept [facility]) [phone]  12/05/2022    [facility] 12/05/2022, 12:11 PM

## 2022-12-05 NOTE — Progress Notes (Signed)
Progress Note   Patient: Emily Dawson [id number] DOB: 1933/09/07 DOA: 11/30/2022     5 DOS: the patient was seen and examined on 12/05/2022   Brief hospital course: Taken from prior note.  Emily Dawson is a 89 y.o. female with medical history significant of Alzheimer's dz, CKD 3a presented to s/p mechanical fall with right hip pain. She was unable to put weight on the right leg. X-ray at the facility confirmed right hip fracture. Patient recently had left hip fracture and February and she underwent THA with Dr. Alvan Dame. She has done extremely well after the surgery and was able to ambulate until last night. She is admitted for closed right hip fracture. Orthopedics is consulted.   Patient underwent right total hip arthroplasty 12/01/2022.  3/29: Vital stable.  She spit out some of her morning meds which were crushed with applesauce.  Not following most of the commands.  Labs with decrease of hemoglobin to 9.1 from 12 most likely secondary to trauma and blood loss during surgery.  Started on iron supplement.  Patient is from a memory care unit for which she was getting 24/7 private sitter and physical therapy.  PT is recommending SNF.  Family is trying to see if she can go back to her memory care unit with some physical therapy.  3/30: Vitals with mild tachycardia and blood pressure at 159/78.  Swallow evaluation with mild aspiration risk and they were recommending dysphagia 1 diet with thin liquids.  Labs with improving leukocytosis at 14, hemoglobin decreased to 7.8, all cell lines decrease.  IVF fluid was discontinued.  Increasing the dose of iron supplement. Daughter does not want any more blood transfusions and would like to focus on comfort. Might not be able to go back to her prior facility as due to the lack of services she needs. TOC is working on it  3/31: Patient remained stable.  Awaiting disposition.  4/1: Still awaiting placement.  Remained stable  Patient is high risk for  mortality based on advanced age, advanced dementia and recent hip fractures.    Assessment and Plan: * Closed right hip fracture, initial encounter R hip fx, h/o L hip fx in Feb, repaired by Dr. Alvan Dame. Underwent total hip arthroplasty on 12/01/2022 Did well post repair, had rehabbed, was ambulating without need for walker. PT are recommending SNF -TOC consult -Continue with pain management and supportive care  CKD (chronic kidney disease) stage 3, GFR 30-59 ml/min Stable and at baseline. -Monitor renal function -Avoid nephrotoxin  Dementia Pt with advanced alzheimer's at baseline. Cont seroquel, PRN xanax  Acute postoperative anemia due to expected blood loss Hemoglobin with gradual decline after the surgery, at 7.8 today.  Also noted macrocytosis.  Patient was started on supplement. Daughter does not want any blood transfusions. -Continue to monitor       Subjective: Patient was sleeping when seen today.  Private caregiver at bedside.  No new concern.  Physical Exam: Vitals:   12/04/22 2131 12/05/22 0639 12/05/22 1004 12/05/22 1225  BP: 139/71 (!) 165/77 (!) 152/72 131/72  Pulse: 94 94 90 94  Resp: 17 18 19 15   Temp: 98.7 F (37.1 C) 98.8 F (37.1 C) 97.8 F (36.6 C) 99.2 F (37.3 C)  TempSrc:      SpO2: 92% 91% 94% 93%  Weight:      Height:       General.  Frail elderly lady, in no acute distress. Pulmonary.  Lungs clear bilaterally, normal respiratory effort. CV.  Regular  rate and rhythm, no JVD, rub or murmur. Abdomen.  Soft, nontender, nondistended, BS positive. CNS.  Somnolent.  No focal neurologic deficit. Extremities.  No edema, no cyanosis, pulses intact and symmetrical. Psychiatry.  Judgment and insight appears impaired.   Data Reviewed: Prior data reviewed  Family Communication: Discussed with caregiver at bedside  Disposition: Status is: Inpatient Remains inpatient appropriate because: Severity of illness  Planned Discharge Destination:  Skilled nursing facility  Time spent: 38 minutes  This record has been created using Systems analyst. Errors have been sought and corrected,but may not always be located. Such creation errors do not reflect on the standard of care.   Author: Lorella Nimrod, MD 12/05/2022 2:06 PM  For on call review [email or web address].

## 2022-12-06 DIAGNOSIS — N1831 Chronic kidney disease, stage 3a: Secondary | ICD-10-CM | POA: Diagnosis not present

## 2022-12-06 DIAGNOSIS — G309 Alzheimer's disease, unspecified: Secondary | ICD-10-CM | POA: Diagnosis not present

## 2022-12-06 DIAGNOSIS — S72001A Fracture of unspecified part of neck of right femur, initial encounter for closed fracture: Secondary | ICD-10-CM | POA: Diagnosis not present

## 2022-12-06 DIAGNOSIS — F0280 Dementia in other diseases classified elsewhere without behavioral disturbance: Secondary | ICD-10-CM | POA: Diagnosis not present

## 2022-12-06 NOTE — Progress Notes (Signed)
Progress Note   Patient: Emily Dawson [id number] DOB: [birth year] DOA: 11/30/2022     6 DOS: the patient was seen and examined on 12/06/2022   Brief hospital course: Taken from prior note.  Emily Dawson is a 89 y.o. female with medical history significant of Alzheimer's dz, CKD 3a presented to s/p mechanical fall with right hip pain. She was unable to put weight on the right leg. X-ray at the facility confirmed right hip fracture. Patient recently had left hip fracture and February and she underwent THA with Dr. Alvan Dame. She has done extremely well after the surgery and was able to ambulate until last night. She is admitted for closed right hip fracture. Orthopedics is consulted.   Patient underwent right total hip arthroplasty 12/01/2022.  3/29: Vital stable.  She spit out some of her morning meds which were crushed with applesauce.  Not following most of the commands.  Labs with decrease of hemoglobin to 9.1 from 12 most likely secondary to trauma and blood loss during surgery.  Started on iron supplement.  Patient is from a memory care unit for which she was getting 24/7 private sitter and physical therapy.  PT is recommending SNF.  Family is trying to see if she can go back to her memory care unit with some physical therapy.  3/30: Vitals with mild tachycardia and blood pressure at 159/78.  Swallow evaluation with mild aspiration risk and they were recommending dysphagia 1 diet with thin liquids.  Labs with improving leukocytosis at 14, hemoglobin decreased to 7.8, all cell lines decrease.  IVF fluid was discontinued.  Increasing the dose of iron supplement. Daughter does not want any more blood transfusions and would like to focus on comfort. Might not be able to go back to her prior facility as due to the lack of services she needs. TOC is working on it  3/31: Patient remained stable.  Awaiting disposition.  4/1: Still awaiting placement.  Remained stable  4/2: Remained stable.  Can go  to rehab tomorrow.  Patient is high risk for mortality based on advanced age, advanced dementia and recent hip fractures.    Assessment and Plan: * Closed right hip fracture, initial encounter R hip fx, h/o L hip fx in Feb, repaired by Dr. Alvan Dame. Underwent total hip arthroplasty on 12/01/2022 Did well post repair, had rehabbed, was ambulating without need for walker. PT are recommending SNF -TOC consult -Continue with pain management and supportive care  CKD (chronic kidney disease) stage 3, GFR 30-59 ml/min Stable and at baseline. -Monitor renal function -Avoid nephrotoxin  Dementia Pt with advanced alzheimer's at baseline. Cont seroquel, PRN xanax  Acute postoperative anemia due to expected blood loss Hemoglobin with gradual decline after the surgery, at 7.8 today.  Also noted macrocytosis.  Patient was started on supplement. Daughter does not want any blood transfusions. -Continue to monitor       Subjective: Patient with no new concern, private sitter at bedside.  Physical Exam: Vitals:   12/06/22 0007 12/06/22 0517 12/06/22 1036 12/06/22 1412  BP: 135/69 (!) 159/85 (!) 142/70 113/78  Pulse: 83 81 87 81  Resp: 14 15 16 16   Temp: 98.2 F (36.8 C) 97.9 F (36.6 C) 98.4 F (36.9 C) 98.6 F (37 C)  TempSrc:   Axillary Oral  SpO2: 94% 95% 97% 98%  Weight:      Height:       General.  Frail elderly lady, in no acute distress. Pulmonary.  Lungs clear bilaterally, normal respiratory  effort. CV.  Regular rate and rhythm, no JVD, rub or murmur. Abdomen.  Soft, nontender, nondistended, BS positive. CNS.  Awake, no focal neurologic deficit. Extremities.  No edema, no cyanosis, pulses intact and symmetrical. Psychiatry.  Judgment and insight appears impaired  Data Reviewed: Prior data reviewed  Family Communication: Discussed with caregiver at bedside  Disposition: Status is: Inpatient Remains inpatient appropriate because: Severity of illness  Planned  Discharge Destination: Skilled nursing facility  Time spent: 35 minutes  This record has been created using Systems analyst. Errors have been sought and corrected,but may not always be located. Such creation errors do not reflect on the standard of care.   Author: Lorella Nimrod, MD 12/06/2022 2:24 PM  For on call review [email or web address].

## 2022-12-06 NOTE — TOC Progression Note (Signed)
Transition of Care [facility]) - Progression Note   Patient Details  Name: Zevaeh Adcox MRN: [id number] Date of Birth: 1933/12/07  Transition of Care [facility]) CM/SW [place], LCSW Phone Number: 12/06/2022, 3:03 PM  Clinical Narrative: CSW followed up with Lorenza Chick at [facility] and the facility will not have an open bed for several days. CSW provided additional bed offers to daughter, [name]. After visiting [facility], family has chosen the facility but was informed the bed will be available 12/07/22. CSW left voicemail for [facility] in admissions at [facility]. TOC anticipating discharge to SNF tomorrow.  Expected Discharge Plan: [place] Barriers to Discharge: No SNF bed  Expected Discharge Plan and Services In-house Referral: Clinical Social Work Post Acute Care Choice: [place] Living arrangements for the past 2 months: [place]             DME Arranged: N/A DME Agency: NA  Social Determinants of Health (SDOH) Interventions SDOH Screenings   Food Insecurity: No Food Insecurity (12/02/2022)  Housing: Low Risk  (12/02/2022)  Transportation Needs: No Transportation Needs (12/02/2022)  Utilities: Not At Risk (12/02/2022)  Depression (PHQ2-9): Low Risk  (07/11/2022)  Tobacco Use: Low Risk  (12/02/2022)   Readmission Risk Interventions     No data to display

## 2022-12-07 DIAGNOSIS — S72001A Fracture of unspecified part of neck of right femur, initial encounter for closed fracture: Secondary | ICD-10-CM | POA: Diagnosis not present

## 2022-12-07 MED ORDER — POLYETHYLENE GLYCOL 3350 17 G PO PACK
17.0000 g | PACK | Freq: Every day | ORAL | Status: DC
  Administered 2022-12-07: 17 g via ORAL
  Filled 2022-12-07: qty 1

## 2022-12-07 MED ORDER — ALPRAZOLAM 0.25 MG PO TABS: 0.2500 mg | ORAL_TABLET | Freq: Three times a day (TID) | ORAL | 0 refills | Status: AC | PRN

## 2022-12-07 NOTE — Discharge Summary (Signed)
Physician Discharge Summary  Emily Dawson [id number] DOB: 09-21-1933 DOA: 11/30/2022  PCP: Edmonia Caprio, NP  Admit date: 11/30/2022 Discharge date: 12/07/2022  Admitted From: Home Disposition:  SNF  Recommendations for Outpatient Follow-up:  Follow up with Orthopedics in 1-2 weeks Please obtain BMP/CBC in one week   Home Health:No Equipment/Devices:None  Discharge Condition:Stable CODE STATUS:DNR Diet recommendation: Heart Healthy   Brief/Interim Summary: 89 year old with past medical history of Alzheimer's disease, current continue stage III presents with a mechanical fall right hip fracture.  Discharge Diagnoses:  Principal Problem:   Closed right hip fracture, initial encounter Active Problems:   CKD (chronic kidney disease) stage 3, GFR 30-59 ml/min   Dementia   S/P total right hip arthroplasty   Acute postoperative anemia due to expected blood loss  Closed right hip fracture: Orthopedic surgery was consulted patient underwent total hip arthroplasty on 11/23/2022. To work with rehab. Physical therapy evaluated patient show Continue narcotics for pain management.  Chronic kidney disease stage stage III: Has remained stable.  Dementia: Continue Seroquel and Xanax as needed.  Acute concentrated walks anemia: Hemoglobin gradually drifted down 7.8.  After this he remained stable. Will need to recheck in 1 to 2 weeks.    Discharge Instructions  Discharge Instructions     Diet - low sodium heart healthy   Complete by: As directed    Increase activity slowly   Complete by: As directed       Allergies as of 12/07/2022       Reactions   Nsaids Other (See Comments)   Unknown reaction   Ampicillin Other (See Comments)   Unknown reaction   Arimidex [anastrozole] Other (See Comments)   Kidney problems    Azithromycin Other (See Comments)   Unknown reaction   Celecoxib Other (See Comments)   Unknown reaction   Fosamax [alendronate] Other (See  Comments)   Kidney problem    Ibuprofen Other (See Comments)   Unknown reaction   Naproxen Other (See Comments)   Unknown reaction   Penicillins Other (See Comments)   Unknown reaction   Sulfa Antibiotics Other (See Comments)   Unknown reaction   Tetracyclines & Related Other (See Comments)   Unknown reaction   Losartan Rash        Medication List     TAKE these medications    Acetaminophen Extra Strength 500 MG Tabs Commonly known as: TYLENOL Take 2 tablets (1,000 mg total) by mouth every 8 (eight) hours as needed for moderate pain, fever or headache. What changed:  when to take this additional instructions   ALPRAZolam 0.25 MG tablet Commonly known as: XANAX Take 1 tablet (0.25 mg total) by mouth 3 (three) times daily as needed for anxiety.   amLODipine 5 MG tablet Commonly known as: NORVASC Take 1 tablet (5 mg total) by mouth daily.   aspirin 81 MG chewable tablet Chew 1 tablet (81 mg total) by mouth 2 (two) times daily for 28 days.   b complex vitamins capsule Take 1 capsule by mouth daily.   HYDROcodone-acetaminophen 5-325 MG tablet Commonly known as: NORCO/VICODIN Take 1 tablet by mouth every 8 (eight) hours as needed for moderate pain or severe pain.   melatonin 5 MG Tabs Take 5 mg by mouth at bedtime.   methocarbamol 500 MG tablet Commonly known as: ROBAXIN Take 1 tablet (500 mg total) by mouth every 8 (eight) hours as needed for muscle spasms.   metoprolol succinate 100 MG 24 hr tablet Commonly known as: TOPROL-XL  Take 1 tablet (100 mg total) by mouth daily. TAKE WITH OR IMMEDIATELY FOLLOWING A MEAL. What changed:  how much to take when to take this   mineral oil-hydrophilic petrolatum ointment Apply topically as needed for dry skin. Both legs What changed:  how much to take additional instructions   polyethylene glycol 17 g packet Commonly known as: MIRALAX / GLYCOLAX Take 17 g by mouth daily as needed for moderate constipation.    QUEtiapine 50 MG tablet Commonly known as: SEROQUEL Take 1 tablet (50 mg total) by mouth at bedtime. What changed:  how much to take when to take this additional instructions   traZODone 100 MG tablet Commonly known as: DESYREL Take 100 mg by mouth at bedtime.   triamcinolone ointment 0.1 % Commonly known as: KENALOG Apply 1 Application topically 2 (two) times daily. Apply small amount to upper arms - may also apply to affected areas of both upper legs as needed   VITAMIN D (CHOLECALCIFEROL) PO Take 2,000 Units by mouth daily.        Contact information for follow-up providers     Paralee Cancel, MD. Schedule an appointment as soon as possible for a visit in 2 week(s).   Specialty: Orthopedic Surgery Contact information: [address] [place] [address] [phone]              Contact information for after-discharge care     Destination     [facility] .   Service: Skilled Nursing Contact information: [address] [place] [address] [phone]                    Allergies  Allergen Reactions   Nsaids Other (See Comments)    Unknown reaction   Ampicillin Other (See Comments)    Unknown reaction    Arimidex [Anastrozole] Other (See Comments)    Kidney problems    Azithromycin Other (See Comments)    Unknown reaction    Celecoxib Other (See Comments)    Unknown reaction    Fosamax [Alendronate] Other (See Comments)    Kidney problem    Ibuprofen Other (See Comments)    Unknown reaction    Naproxen Other (See Comments)    Unknown reaction    Penicillins Other (See Comments)    Unknown reaction    Sulfa Antibiotics Other (See Comments)    Unknown reaction     Tetracyclines & Related Other (See Comments)    Unknown reaction     Losartan Rash    Consultations: Orthopedic surgery   Procedures/Studies: DG Pelvis Portable  Result Date: 12/01/2022 CLINICAL DATA:   Status post right hip arthroplasty EXAM: PORTABLE PELVIS 1-2 VIEWS COMPARISON:  10/13/2022 FINDINGS: There is interval right hip arthroplasty. No displaced fracture or dislocation is seen. There is previous left hip arthroplasty. Surgical clips are seen in pelvis. IMPRESSION: Interval right hip arthroplasty. Electronically Signed   By: Elmer Picker M.D.   On: 12/01/2022 13:48   DG HIP UNILAT WITH PELVIS 1V RIGHT  Result Date: 12/01/2022 CLINICAL DATA:  Right hip replacement. EXAM: DG HIP (WITH OR WITHOUT PELVIS) 1V RIGHT; DG C-ARM 1-60 MIN-NO REPORT Radiation exposure index: 0.7716 mGy. COMPARISON:  November 30, 2022. FINDINGS: Five intraoperative fluoroscopic images were obtained of the right hip. These images demonstrate right total hip arthroplasty for treatment of right proximal femoral neck fracture. IMPRESSION: Fluoroscopic guidance provided during right total hip arthroplasty. Electronically Signed   By: Marijo Conception  M.D.   On: 12/01/2022 13:25   DG C-Arm 1-60 Min-No Report  Result Date: 12/01/2022 Fluoroscopy was utilized by the requesting physician.  No radiographic interpretation.   Chest Portable 1 View  Result Date: 12/01/2022 CLINICAL DATA:  Preoperative chest examination. Altered mental status. EXAM: PORTABLE CHEST 1 VIEW COMPARISON:  10/13/2022 FINDINGS: Cardiac enlargement. Streaky perihilar interstitial and airspace opacities are mildly progressed since prior study and could represent edema, pneumonia, or aspiration. Underlying chronic bronchitic changes may be present. No pleural effusions. No pneumothorax. Mediastinal contours appear intact. Surgical clips in the right axilla. IMPRESSION: Cardiac enlargement with nonspecific perihilar infiltrates. Infiltrates appear to be progressing since prior study. Electronically Signed   By: Lucienne Capers M.D.   On: 12/01/2022 00:43   DG Hip Unilat W or Wo Pelvis 2-3 Views Right  Result Date: 11/30/2022 CLINICAL DATA:  Right hip  pain, possible fracture. EXAM: DG HIP (WITH OR WITHOUT PELVIS) 2-3V RIGHT COMPARISON:  10/25/2022. FINDINGS: There is a displaced subcapital femoral fracture on the right with superior subluxation of the distal fracture fragment. No dislocation. Total arthroplasty changes are noted on the left without evidence of hardware loosening. The remaining bony structures are intact. Degenerative changes are noted in the lower lumbar spine. Multiple surgical clips are seen in the pelvis. IMPRESSION: 1. Displaced subcapital femoral fracture on the right. 2. Status post total hip arthroplasty on the left without evidence of hardware loosening. Electronically Signed   By: Brett Fairy M.D.   On: 11/30/2022 22:58   (Echo, Carotid, EGD, Colonoscopy, ERCP)    Subjective: No complaints  Discharge Exam: Vitals:   12/06/22 2301 12/07/22 0535  BP: (!) 152/85 125/77  Pulse: 100 95  Resp: 16 18  Temp: 98.3 F (36.8 C) 98.1 F (36.7 C)  SpO2: 98% 97%   Vitals:   12/06/22 1036 12/06/22 1412 12/06/22 2301 12/07/22 0535  BP: (!) 142/70 113/78 (!) 152/85 125/77  Pulse: 87 81 100 95  Resp: 16 16 16 18   Temp: 98.4 F (36.9 C) 98.6 F (37 C) 98.3 F (36.8 C) 98.1 F (36.7 C)  TempSrc: Axillary Oral Oral Oral  SpO2: 97% 98% 98% 97%  Weight:      Height:        General: Pt is alert, awake, not in acute distress Cardiovascular: RRR, S1/S2 +, no rubs, no gallops Respiratory: CTA bilaterally, no wheezing, no rhonchi Abdominal: Soft, NT, ND, bowel sounds + Extremities: no edema, no cyanosis    The results of significant diagnostics from this hospitalization (including imaging, microbiology, ancillary and laboratory) are listed below for reference.     Microbiology: Recent Results (from the past 240 hour(s))  Surgical pcr screen     Status: None   Collection Time: 12/01/22  8:34 AM   Specimen: Nasal Mucosa; Nasal Swab  Result Value Ref Range Status   MRSA, PCR NEGATIVE NEGATIVE Final   Staphylococcus  aureus NEGATIVE NEGATIVE Final    Comment: (NOTE) The Xpert SA Assay (FDA approved for NASAL specimens in patients 56 years of age and older), is one component of a comprehensive surveillance program. It is not intended to diagnose infection nor to guide or monitor treatment. Performed at [facility], [place] [address], [place], [place] [address]      Labs: BNP (last 3 results) No results for input(s): "BNP" in the last 8760 hours. Basic Metabolic Panel: Recent Labs  Lab 11/30/22 2244 12/02/22 0317  NA 143 141  K 3.8 3.5  CL  109 110  CO2 23 24  GLUCOSE 132* 139*  BUN 34* 35*  CREATININE 1.15* 0.83  CALCIUM 9.3 8.1*   Liver Function Tests: No results for input(s): "AST", "ALT", "ALKPHOS", "BILITOT", "PROT", "ALBUMIN" in the last 168 hours. No results for input(s): "LIPASE", "AMYLASE" in the last 168 hours. No results for input(s): "AMMONIA" in the last 168 hours. CBC: Recent Labs  Lab 11/30/22 2244 12/02/22 0317 12/03/22 0327  WBC 18.5* 16.1* 14.1*  NEUTROABS 13.9*  --   --   HGB 12.0 9.1* 7.8*  HCT 35.2* 28.5* 24.5*  MCV 98.6 102.5* 103.8*  PLT 267 209 190   Cardiac Enzymes: No results for input(s): "CKTOTAL", "CKMB", "CKMBINDEX", "TROPONINI" in the last 168 hours. BNP: Invalid input(s): "POCBNP" CBG: No results for input(s): "GLUCAP" in the last 168 hours. D-Dimer No results for input(s): "DDIMER" in the last 72 hours. Hgb A1c No results for input(s): "HGBA1C" in the last 72 hours. Lipid Profile No results for input(s): "CHOL", "HDL", "LDLCALC", "TRIG", "CHOLHDL", "LDLDIRECT" in the last 72 hours. Thyroid function studies No results for input(s): "TSH", "T4TOTAL", "T3FREE", "THYROIDAB" in the last 72 hours.  Invalid input(s): "FREET3" Anemia work up No results for input(s): "VITAMINB12", "FOLATE", "FERRITIN", "TIBC", "IRON", "RETICCTPCT" in the last 72 hours. Urinalysis    Component Value Date/Time   COLORURINE STRAW (A) 10/13/2022  0815   APPEARANCEUR CLEAR 10/13/2022 0815   LABSPEC 1.012 10/13/2022 0815   PHURINE 7.0 10/13/2022 0815   GLUCOSEU 50 (A) 10/13/2022 0815   HGBUR NEGATIVE 10/13/2022 0815   BILIRUBINUR NEGATIVE 10/13/2022 0815   BILIRUBINUR Negative 03/19/2021 1449   KETONESUR 20 (A) 10/13/2022 0815   PROTEINUR 30 (A) 10/13/2022 0815   UROBILINOGEN negative (A) 03/19/2021 1449   NITRITE NEGATIVE 10/13/2022 0815   LEUKOCYTESUR NEGATIVE 10/13/2022 0815   Sepsis Labs Recent Labs  Lab 11/30/22 2244 12/02/22 0317 12/03/22 0327  WBC 18.5* 16.1* 14.1*   Microbiology Recent Results (from the past 240 hour(s))  Surgical pcr screen     Status: None   Collection Time: 12/01/22  8:34 AM   Specimen: Nasal Mucosa; Nasal Swab  Result Value Ref Range Status   MRSA, PCR NEGATIVE NEGATIVE Final   Staphylococcus aureus NEGATIVE NEGATIVE Final    Comment: (NOTE) The Xpert SA Assay (FDA approved for NASAL specimens in patients 60 years of age and older), is one component of a comprehensive surveillance program. It is not intended to diagnose infection nor to guide or monitor treatment. Performed at [facility], [place] [address], [place], [place] [address]       SIGNED:   Charlynne Cousins, MD  [facility] 12/07/2022, 9:54 AM Pager   If 7PM-7AM, please contact night-coverage [email or web address] Password [email or web address]

## 2022-12-07 NOTE — Plan of Care (Signed)
  Problem: Education: Goal: Knowledge of General Education information will improve Description: Including pain rating scale, medication(s)/side effects and non-pharmacologic comfort measures Outcome: Adequate for Discharge   Problem: Health Behavior/Discharge Planning: Goal: Ability to manage health-related needs will improve Outcome: Adequate for Discharge   Problem: Clinical Measurements: Goal: Ability to maintain clinical measurements within normal limits will improve Outcome: Adequate for Discharge Goal: Will remain free from infection Outcome: Adequate for Discharge Goal: Diagnostic test results will improve Outcome: Adequate for Discharge Goal: Cardiovascular complication will be avoided Outcome: Adequate for Discharge   Problem: Activity: Goal: Risk for activity intolerance will decrease Outcome: Adequate for Discharge   Problem: Nutrition: Goal: Adequate nutrition will be maintained Outcome: Adequate for Discharge   Problem: Coping: Goal: Level of anxiety will decrease Outcome: Adequate for Discharge   Problem: Elimination: Goal: Will not experience complications related to bowel motility Outcome: Adequate for Discharge Goal: Will not experience complications related to urinary retention Outcome: Adequate for Discharge   Problem: Pain Managment: Goal: General experience of comfort will improve Outcome: Adequate for Discharge   Problem: Safety: Goal: Ability to remain free from injury will improve Outcome: Adequate for Discharge   Problem: Skin Integrity: Goal: Risk for impaired skin integrity will decrease Outcome: Adequate for Discharge   Problem: Education: Goal: Knowledge of the prescribed therapeutic regimen will improve Outcome: Adequate for Discharge Goal: Understanding of discharge needs will improve Outcome: Adequate for Discharge Goal: Individualized Educational Video(s) Outcome: Adequate for Discharge   Problem: Activity: Goal: Ability to  avoid complications of mobility impairment will improve Outcome: Adequate for Discharge Goal: Ability to tolerate increased activity will improve Outcome: Adequate for Discharge   Problem: Clinical Measurements: Goal: Postoperative complications will be avoided or minimized Outcome: Adequate for Discharge   Problem: Pain Management: Goal: Pain level will decrease with appropriate interventions Outcome: Adequate for Discharge   Problem: Skin Integrity: Goal: Will show signs of wound healing Outcome: Adequate for Discharge   

## 2022-12-07 NOTE — TOC Transition Note (Signed)
Transition of Care [facility]) - CM/SW Discharge Note  Patient Details  Name: Emily Dawson MRN: [id number] Date of Birth: [birth year]  Transition of Care [facility]) CM/SW Contact:  Sherie Don, LCSW Phone Number: 12/07/2022, 10:56 AM  Clinical Narrative: CSW confirmed with Sheree in admissions that the rehab bed is available in the secure unit at [facility] today due to patient's dementia. Patient will go to room 300B and the number for report is [phone]. Discharge summary, discharge orders, and SNF transfer report faxed to facility in hub. Medical necessity form done; PTAR scheduled. Discharge packet completed. CSW notified patient's daughters, [name] and Eustaquio Maize, regarding discharge and transportation being set up as well as the patient's sitter, Malachy Mood, that was with the patient in her room. RN updated. TOC signing off.  Final next level of care: Skilled Nursing Facility Barriers to Discharge: Barriers Resolved  Patient Goals and CMS Choice CMS Medicare.gov Compare Post Acute Care list provided to:: Patient Represenative (must comment) Choice offered to / list presented to : Adult Children  Discharge Placement PASRR number recieved: 12/03/22        Patient chooses bed at: [facility] Patient to be transferred to facility by: [facility] Name of family member notified: [name] & [name] (daughters) Patient and family notified of of transfer: 12/07/22  Discharge Plan and Services Additional resources added to the After Visit Summary for   In-house Referral: Clinical Social Work Post Acute Care Choice: [facility]          DME Arranged: N/A DME Agency: NA  Social Determinants of Health (SDOH) Interventions SDOH Screenings   Food Insecurity: No Food Insecurity (12/02/2022)  Housing: Low Risk  (12/02/2022)  Transportation Needs: No Transportation Needs (12/02/2022)  Utilities: Not At Risk (12/02/2022)  Depression (PHQ2-9): Low Risk  (07/11/2022)  Tobacco Use: Low  Risk  (12/02/2022)   Readmission Risk Interventions     No data to display
